# Patient Record
Sex: Female | Born: 1935 | Race: White | Hispanic: No | Smoking: Never smoker
Health system: Southern US, Community
[De-identification: ages and names within clinical notes are randomized; demographics above are authoritative.]

## PROBLEM LIST (undated history)

## (undated) DIAGNOSIS — I4891 Unspecified atrial fibrillation: Secondary | ICD-10-CM

## (undated) DIAGNOSIS — I10 Essential (primary) hypertension: Secondary | ICD-10-CM

## (undated) DIAGNOSIS — M545 Low back pain, unspecified: Secondary | ICD-10-CM

## (undated) DIAGNOSIS — N183 Chronic kidney disease, stage 3 unspecified: Secondary | ICD-10-CM

## (undated) DIAGNOSIS — I4892 Unspecified atrial flutter: Secondary | ICD-10-CM

## (undated) DIAGNOSIS — C50919 Malignant neoplasm of unspecified site of unspecified female breast: Secondary | ICD-10-CM

## (undated) DIAGNOSIS — I5030 Unspecified diastolic (congestive) heart failure: Secondary | ICD-10-CM

## (undated) DIAGNOSIS — E785 Hyperlipidemia, unspecified: Secondary | ICD-10-CM

## (undated) DIAGNOSIS — N959 Unspecified menopausal and perimenopausal disorder: Secondary | ICD-10-CM

## (undated) DIAGNOSIS — I38 Endocarditis, valve unspecified: Secondary | ICD-10-CM

## (undated) DIAGNOSIS — D510 Vitamin B12 deficiency anemia due to intrinsic factor deficiency: Secondary | ICD-10-CM

## (undated) DIAGNOSIS — E079 Disorder of thyroid, unspecified: Secondary | ICD-10-CM

## (undated) HISTORY — DX: Unspecified menopausal and perimenopausal disorder: N95.9

## (undated) HISTORY — PX: GALLBLADDER SURGERY: SHX652

## (undated) HISTORY — DX: Hyperlipidemia, unspecified: E78.5

## (undated) HISTORY — DX: Vitamin B12 deficiency anemia due to intrinsic factor deficiency: D51.0

## (undated) HISTORY — DX: Unspecified diastolic (congestive) heart failure: I50.30

## (undated) HISTORY — PX: THYROIDECTOMY: SHX17

## (undated) HISTORY — DX: Disorder of thyroid, unspecified: E07.9

## (undated) HISTORY — PX: MASTECTOMY: SHX3

## (undated) HISTORY — DX: Chronic kidney disease, stage 3 unspecified: N18.30

## (undated) HISTORY — PX: BREAST SURGERY: SHX581

## (undated) HISTORY — DX: Low back pain: M54.5

## (undated) HISTORY — DX: Unspecified atrial fibrillation: I48.91

## (undated) HISTORY — DX: Low back pain, unspecified: M54.50

## (undated) HISTORY — DX: Malignant neoplasm of unspecified site of unspecified female breast: C50.919

## (undated) HISTORY — DX: Endocarditis, valve unspecified: I38

## (undated) HISTORY — DX: Unspecified atrial flutter: I48.92

## (undated) HISTORY — DX: Chronic kidney disease, stage 3 (moderate): N18.3

## (undated) HISTORY — DX: Essential (primary) hypertension: I10

---

## 1998-02-28 ENCOUNTER — Other Ambulatory Visit: Admission: RE | Admit: 1998-02-28 | Discharge: 1998-02-28 | Payer: Self-pay | Admitting: Gastroenterology

## 2004-11-20 ENCOUNTER — Ambulatory Visit: Payer: Self-pay | Admitting: Unknown Physician Specialty

## 2005-04-07 ENCOUNTER — Other Ambulatory Visit: Payer: Self-pay

## 2005-04-07 ENCOUNTER — Inpatient Hospital Stay: Payer: Self-pay | Admitting: Internal Medicine

## 2005-05-01 ENCOUNTER — Inpatient Hospital Stay (HOSPITAL_COMMUNITY): Admission: EM | Admit: 2005-05-01 | Discharge: 2005-05-03 | Payer: Self-pay | Admitting: Emergency Medicine

## 2005-06-02 ENCOUNTER — Ambulatory Visit: Payer: Self-pay | Admitting: Internal Medicine

## 2005-08-13 ENCOUNTER — Ambulatory Visit: Payer: Self-pay | Admitting: Internal Medicine

## 2005-09-12 ENCOUNTER — Ambulatory Visit: Payer: Self-pay | Admitting: Internal Medicine

## 2005-10-14 ENCOUNTER — Ambulatory Visit: Payer: Self-pay | Admitting: Internal Medicine

## 2006-08-04 ENCOUNTER — Ambulatory Visit: Payer: Self-pay | Admitting: Ophthalmology

## 2006-08-11 ENCOUNTER — Ambulatory Visit: Payer: Self-pay | Admitting: Ophthalmology

## 2006-11-19 ENCOUNTER — Ambulatory Visit: Payer: Self-pay | Admitting: Internal Medicine

## 2007-02-22 ENCOUNTER — Ambulatory Visit: Payer: Self-pay | Admitting: Internal Medicine

## 2007-12-30 ENCOUNTER — Ambulatory Visit: Payer: Self-pay | Admitting: Internal Medicine

## 2008-03-31 ENCOUNTER — Ambulatory Visit: Payer: Self-pay | Admitting: Internal Medicine

## 2008-05-10 ENCOUNTER — Ambulatory Visit: Payer: Self-pay | Admitting: Unknown Physician Specialty

## 2008-11-21 ENCOUNTER — Emergency Department: Payer: Self-pay | Admitting: Emergency Medicine

## 2008-11-30 ENCOUNTER — Emergency Department: Payer: Self-pay | Admitting: Emergency Medicine

## 2009-01-22 ENCOUNTER — Ambulatory Visit: Payer: Self-pay | Admitting: Internal Medicine

## 2009-05-31 ENCOUNTER — Ambulatory Visit: Payer: Self-pay | Admitting: Internal Medicine

## 2009-09-10 ENCOUNTER — Ambulatory Visit: Payer: Self-pay

## 2009-10-14 ENCOUNTER — Emergency Department: Payer: Self-pay | Admitting: Emergency Medicine

## 2009-10-20 ENCOUNTER — Emergency Department: Payer: Self-pay | Admitting: Emergency Medicine

## 2009-10-23 ENCOUNTER — Inpatient Hospital Stay: Payer: Self-pay | Admitting: Unknown Physician Specialty

## 2009-11-24 ENCOUNTER — Emergency Department: Payer: Self-pay | Admitting: Emergency Medicine

## 2009-11-26 ENCOUNTER — Emergency Department: Payer: Self-pay | Admitting: Emergency Medicine

## 2009-12-05 ENCOUNTER — Observation Stay: Payer: Self-pay | Admitting: Internal Medicine

## 2010-03-19 ENCOUNTER — Ambulatory Visit: Payer: Self-pay | Admitting: Internal Medicine

## 2010-09-10 ENCOUNTER — Encounter: Payer: Self-pay | Admitting: Rheumatology

## 2010-11-18 ENCOUNTER — Ambulatory Visit: Payer: Self-pay | Admitting: Ophthalmology

## 2010-11-18 DIAGNOSIS — I119 Hypertensive heart disease without heart failure: Secondary | ICD-10-CM

## 2010-11-26 ENCOUNTER — Ambulatory Visit: Payer: Self-pay | Admitting: Ophthalmology

## 2011-03-11 ENCOUNTER — Inpatient Hospital Stay: Payer: Self-pay | Admitting: Internal Medicine

## 2011-03-15 ENCOUNTER — Emergency Department: Payer: Self-pay | Admitting: Emergency Medicine

## 2011-06-15 ENCOUNTER — Emergency Department: Payer: Self-pay | Admitting: Emergency Medicine

## 2011-08-02 ENCOUNTER — Observation Stay: Payer: Self-pay | Admitting: Internal Medicine

## 2011-10-21 ENCOUNTER — Other Ambulatory Visit: Payer: Self-pay | Admitting: Internal Medicine

## 2011-11-06 ENCOUNTER — Ambulatory Visit: Payer: Self-pay | Admitting: Internal Medicine

## 2011-11-06 LAB — PROTIME-INR: Prothrombin Time: 26 secs — ABNORMAL HIGH (ref 11.5–14.7)

## 2012-04-05 ENCOUNTER — Emergency Department: Payer: Self-pay | Admitting: Emergency Medicine

## 2012-04-05 LAB — URINALYSIS, COMPLETE
Bilirubin,UR: NEGATIVE
Blood: NEGATIVE
Glucose,UR: NEGATIVE mg/dL
Hyaline Cast: 20
Ketone: NEGATIVE
Nitrite: NEGATIVE
Ph: 5
Protein: NEGATIVE
RBC,UR: 5 /HPF
Specific Gravity: 1.009
Squamous Epithelial: 1
WBC UR: 5 /HPF

## 2012-04-07 LAB — URINE CULTURE

## 2012-10-25 ENCOUNTER — Emergency Department: Payer: Self-pay | Admitting: Emergency Medicine

## 2012-10-25 LAB — CBC
MCHC: 33.2 g/dL (ref 32.0–36.0)
MCV: 92 fL (ref 80–100)
RBC: 4.72 10*6/uL (ref 3.80–5.20)

## 2012-10-25 LAB — BASIC METABOLIC PANEL
Anion Gap: 11 (ref 7–16)
Calcium, Total: 9.1 mg/dL (ref 8.5–10.1)
Chloride: 102 mmol/L (ref 98–107)
Creatinine: 0.83 mg/dL (ref 0.60–1.30)
EGFR (Non-African Amer.): >60

## 2012-10-25 LAB — TROPONIN I: Troponin-I: <0.02 ng/mL

## 2012-10-25 LAB — CK TOTAL AND CKMB (NOT AT ARMC): CK, Total: 58 U/L (ref 21–215)

## 2012-10-26 LAB — URINALYSIS, COMPLETE
Blood: NEGATIVE
Glucose,UR: NEGATIVE mg/dL (ref 0–75)
Hyaline Cast: 25
RBC,UR: 4 /HPF (ref 0–5)
Squamous Epithelial: 1
WBC UR: 9 /HPF (ref 0–5)

## 2012-10-26 LAB — TSH: Thyroid Stimulating Horm: 0.539 u[IU]/mL

## 2012-10-26 LAB — PROTIME-INR: INR: 2

## 2012-10-27 LAB — URINE CULTURE

## 2013-02-27 ENCOUNTER — Emergency Department: Payer: Self-pay | Admitting: Emergency Medicine

## 2013-02-27 LAB — URINALYSIS, COMPLETE
Bacteria: NONE SEEN
Bilirubin,UR: NEGATIVE
Leukocyte Esterase: NEGATIVE
Nitrite: NEGATIVE
Specific Gravity: 1.005 (ref 1.003–1.030)

## 2013-02-27 LAB — BASIC METABOLIC PANEL
Anion Gap: 6 — ABNORMAL LOW (ref 7–16)
BUN: 13 mg/dL (ref 7–18)
Calcium, Total: 8.8 mg/dL (ref 8.5–10.1)
Creatinine: 0.82 mg/dL (ref 0.60–1.30)
Glucose: 107 mg/dL — ABNORMAL HIGH (ref 65–99)
Osmolality: 276 (ref 275–301)
Potassium: 3.8 mmol/L (ref 3.5–5.1)

## 2013-02-27 LAB — CBC
HCT: 41.7 % (ref 35.0–47.0)
HGB: 14.2 g/dL (ref 12.0–16.0)
MCH: 31.2 pg (ref 26.0–34.0)
MCHC: 34 g/dL (ref 32.0–36.0)
WBC: 8.6 10*3/uL (ref 3.6–11.0)

## 2013-02-27 LAB — PROTIME-INR: Prothrombin Time: 25.1 secs — ABNORMAL HIGH (ref 11.5–14.7)

## 2013-02-27 LAB — TSH: Thyroid Stimulating Horm: 0.468 u[IU]/mL

## 2013-02-27 LAB — TROPONIN I: Troponin-I: <0.02 ng/mL

## 2013-03-12 ENCOUNTER — Emergency Department: Payer: Self-pay | Admitting: Emergency Medicine

## 2014-02-20 ENCOUNTER — Ambulatory Visit: Payer: Self-pay | Admitting: Family Medicine

## 2014-07-04 ENCOUNTER — Encounter: Payer: Self-pay | Admitting: Internal Medicine

## 2014-07-04 ENCOUNTER — Ambulatory Visit (INDEPENDENT_AMBULATORY_CARE_PROVIDER_SITE_OTHER): Payer: PRIVATE HEALTH INSURANCE | Admitting: Internal Medicine

## 2014-07-04 VITALS — BP 148/98 | HR 95 | Ht 63.0 in | Wt 186.2 lb

## 2014-07-04 DIAGNOSIS — I4891 Unspecified atrial fibrillation: Secondary | ICD-10-CM

## 2014-07-04 DIAGNOSIS — R079 Chest pain, unspecified: Secondary | ICD-10-CM

## 2014-07-04 NOTE — Progress Notes (Signed)
ELECTROPHYSIOLOGY CONSULT NOTE  Patient ID: Tammie Mcmahon, MRN: [id number], DOB/AGE: 12-06-1927 80 y.o. Admit date: (Not on file) Date of Consult: 07/04/2014  Primary Physician: Kirk Ruths., MD Primary Cardiologist: new  Chief Complaint: Atrial fibrillation   HPI Tammie Mcmahon is a 80 y.o. female  Referred for consideration of treatment options given persistent/permanent atrial fibrillation/flutter an ongoing problem with dyspnea on exertion.  This is been noted at least since 2012 with heart rates in the 90s/120s. She's been treated with metoprolol-->> 200 mg and diltiazem-->> 360 mg daily with ongoing heart rates overall 100. This has been further complicated by dyspnea on exertion and some peripheral edema. She denies lightheadedness or syncope.  Echocardiogram 2012 was normal LV function with moderate left atrial enlargement; Myoview 2012 without ischemia  Thromboembolic risk profile age-55, hypertension-1, diabetes-1, gender-1, for a CHADS-VASc score of 5    Past Medical History  Diagnosis Date  . A-fib   . Arrhythmia   . Breast cancer   . Thyroid disease   . Hypertension   . Hyperlipidemia   . Diabetes mellitus without complication   . CKD (chronic kidney disease), stage III   . Valvular heart disease   . Low back pain   . PA (pernicious anemia)   . Menopausal and postmenopausal disorder       Surgical History:  Past Surgical History  Procedure Laterality Date  . Thyroidectomy    . Breast surgery    . Gallbladder surgery       Home Meds: Prior to Admission medications   Medication Sig Start Date End Date Taking? Authorizing Provider  cyanocobalamin (,VITAMIN B-12,) 1000 MCG/ML injection Inject into the muscle every 30 (thirty) days.  03/07/14 03/02/15 Yes Historical Provider, MD  diltiazem (CARTIA XT) 180 MG 24 hr capsule Take 180 mg by mouth daily.  01/20/14  Yes Historical Provider, MD  DULoxetine (CYMBALTA) 60 MG capsule take 1  capsule by mouth once daily 06/26/14  Yes Historical Provider, MD  fluticasone (FLONASE) 50 MCG/ACT nasal spray 1 spray as needed.  01/01/14  Yes Historical Provider, MD  furosemide (LASIX) 20 MG tablet Take 20 mg by mouth daily.   Yes Historical Provider, MD  levothyroxine (SYNTHROID, LEVOTHROID) 125 MCG tablet Take 125 mcg by mouth daily before breakfast.   Yes Historical Provider, MD  metoprolol (TOPROL-XL) 200 MG 24 hr tablet Take 200 mg by mouth daily.  11/07/13  Yes Historical Provider, MD  potassium chloride (MICRO-K) 10 MEQ CR capsule Take 10 mEq by mouth 2 (two) times daily. take 1 capsule by mouth twice a day 04/10/14  Yes Historical Provider, MD  pravastatin (PRAVACHOL) 40 MG tablet take 1 tablet by mouth EVERY NIGHT 05/19/14  Yes Historical Provider, MD  warfarin (COUMADIN) 2 MG tablet Take by mouth as directed.  12/26/13  Yes Historical Provider, MD     Allergies: No Known Allergies  History   Social History  . Marital Status: Divorced    Spouse Name: N/A    Number of Children: N/A  . Years of Education: N/A   Occupational History  . Not on file.   Social History Main Topics  . Smoking status: Never Smoker   . Smokeless tobacco: Not on file  . Alcohol Use: No  . Drug Use: No  . Sexual Activity: Not on file   Other Topics Concern  . Not on file   Social History Narrative  . No narrative on file     Family History  Problem Relation Age of Onset  . Heart attack Father      ROS:  Please see the history of present illness.     All other systems reviewed and negative.    Physical Exam: Height 5\' 3"  (1.6 m), weight 186 lb 4 oz (84.482 kg). General: Well developed, well nourished female in no acute distress. Head: Normocephalic, atraumatic, sclera non-icteric, no xanthomas, nares are without discharge. EENT: normal Lymph Nodes:  none Back: without scoliosis/kyphosis , no CVA tendersness Neck: Negative for carotid bruits. JVD not elevated. Lungs: Clear bilaterally to  auscultation without wheezes, rales, or rhonchi. Breathing is unlabored. Heart: rapid and regular RR with S1 S2. 2 /6 systolic murmur , rubs, or gallops appreciated. Abdomen: Soft, non-tender, non-distended with normoactive bowel sounds. No hepatomegaly. No rebound/guarding. No obvious abdominal masses. Msk:  Strength and tone appear normal for age. Extremities: No clubbing or cyanosis. No edema.  Distal pedal pulses are 2+ and equal bilaterally. Skin: Warm and Dry Neuro: Alert and oriented X 3. CN III-XII intact Grossly normal sensory and motor function . Psych:  Responds to questions appropriately with a normal affect.      Labs: Cardiac Enzymes No results found for this basename: CKTOTAL, CKMB, TROPONINI,  in the last 72 hours CBC No results found for this basename: WBC, HGB, HCT, MCV, PLT   PROTIME: No results found for this basename: LABPROT, INR,  in the last 72 hours Chemistry No results found for this basename: NA, K, CL, CO2, BUN, CREATININE, CALCIUM, LABALBU, PROT, BILITOT, ALKPHOS, ALT, AST, GLUCOSE,  in the last 168 hours Lipids No results found for this basename: CHOL, HDL, LDLCALC, TRIG   BNP No results found for this basename: probnp   Miscellaneous No results found for this basename: DDIMER    Radiology/Studies:  No results found.  EKG:  Atrial flutter with an atrial cycle length of 300 ms with 2 to one conduction. 7/14 atrial flutter with variable conduction  atrial cycle length 260 ms 1/13 atrial flutter with variable conduction  atrial cycle length 2 60 ms 6/12 atrial flutter with variable conduction  atrial cycle length 280 ms  Assessment and Plan Atrial flutter/fibrillation  Chest pain syndrome  HFpEF  Thromboembolic risk factors-hypertension, diabetes, age, gender her CHADS-VASc score of 5  Breast cancer history status post mastectomy  The patient has rapid atrial fibrillation/flutter with variable but seemingly progressive atrial cycle length  lengthening suggestive of a progressive atrial disorder. She has been noted to have heart rates recently in the 120s. We need to clarify severity of heart rate range to identify treatment options as she is maximally medicated with calcium blockers and beta blockers. With normal LV function I am loath to use digoxin  We will get an echocardiogram to look for interval only function deterioration  In the event that we see no and Holter monitoring demonstrates excessive rates, AV junction ablation with pacing may be our next best option. I have reviewed this with her.  I am also concerned about her chest pain syndrome. She does have a history of significant gas and there was some gas associated with this episode perhaps (history changed over time) suggesting that it may be a colonic flexure syndrome picture; however, given its recurrent nature, we'll check a troponin to make sure. The troponin is abnormal she will need further coronary evaluation; her last stress test was 2012 demonstrated normal perfusion  Virl Axe

## 2014-07-04 NOTE — Patient Instructions (Signed)
Your physician recommends that you have labs today:  Troponin   Your physician has requested that you have an echocardiogram. Echocardiography is a painless test that uses sound waves to create images of your heart. It provides your doctor with information about the size and shape of your heart and how well your heart's chambers and valves are working. This procedure takes approximately one hour. There are no restrictions for this procedure.   Your physician has recommended that you wear a 24 hr holter monitor. Holter monitors are medical devices that record the heart's electrical activity. Doctors most often use these monitors to diagnose arrhythmias. Arrhythmias are problems with the speed or rhythm of the heartbeat. The monitor is a small, portable device. You can wear one while you do your normal daily activities. This is usually used to diagnose what is causing palpitations/syncope (passing out).  Your physician recommends that you schedule a follow-up appointment in:  3-4 weeks

## 2014-07-05 LAB — TROPONIN I: Troponin I: 0.02 ng/mL (ref 0.00–0.04)

## 2014-07-10 DIAGNOSIS — I4891 Unspecified atrial fibrillation: Secondary | ICD-10-CM

## 2014-07-13 ENCOUNTER — Other Ambulatory Visit: Payer: PRIVATE HEALTH INSURANCE

## 2014-07-18 ENCOUNTER — Other Ambulatory Visit (INDEPENDENT_AMBULATORY_CARE_PROVIDER_SITE_OTHER): Payer: PRIVATE HEALTH INSURANCE

## 2014-07-18 ENCOUNTER — Other Ambulatory Visit: Payer: Self-pay

## 2014-07-18 DIAGNOSIS — R579 Shock, unspecified: Secondary | ICD-10-CM

## 2014-07-18 DIAGNOSIS — R079 Chest pain, unspecified: Secondary | ICD-10-CM

## 2014-07-18 DIAGNOSIS — I481 Persistent atrial fibrillation: Secondary | ICD-10-CM

## 2014-07-20 ENCOUNTER — Telehealth: Payer: Self-pay | Admitting: *Deleted

## 2014-07-20 NOTE — Telephone Encounter (Signed)
Informed patient per Dr. Caryl Comes holter showed  Controlled ventricular response  Tammie Mcmahon afib   Patient verbalized understanding

## 2014-07-24 ENCOUNTER — Ambulatory Visit (INDEPENDENT_AMBULATORY_CARE_PROVIDER_SITE_OTHER): Payer: PRIVATE HEALTH INSURANCE

## 2014-07-24 ENCOUNTER — Other Ambulatory Visit: Payer: Self-pay

## 2014-07-24 DIAGNOSIS — I4891 Unspecified atrial fibrillation: Secondary | ICD-10-CM

## 2014-07-25 ENCOUNTER — Encounter: Payer: Self-pay | Admitting: Internal Medicine

## 2014-07-25 ENCOUNTER — Ambulatory Visit (INDEPENDENT_AMBULATORY_CARE_PROVIDER_SITE_OTHER): Payer: PRIVATE HEALTH INSURANCE | Admitting: Internal Medicine

## 2014-07-25 VITALS — BP 138/92 | HR 91 | Ht 63.0 in | Wt 186.5 lb

## 2014-07-25 DIAGNOSIS — R079 Chest pain, unspecified: Secondary | ICD-10-CM

## 2014-07-25 NOTE — Progress Notes (Signed)
Patient Care Team: Kirk Ruths, MD as PCP - General (Internal Medicine)   HPI  Tammie Mcmahon is a 80 y.o. female Seen in followup for atrial flutter with progressive atrial cycling slowing suggestive of worsening atrial cardiomyopathy.   At that time we increased her diltiazem from 1--2 a day since that time, she is feeling much better with less dyspnea and no edema  She underwent echocardiogram 10/15 demonstrated an ejection fraction fraction of 55-60%. Left atrial size is only mildly enlarged.  Holter monitoring demonstrated controlled ventricular response and largely underlying atrial fibrillation as opposed to flutter. Mean heart rate was 79 with a range of 52--97     Past Medical History  Diagnosis Date  . A-fib   . Arrhythmia   . Breast cancer   . Thyroid disease   . Hypertension   . Hyperlipidemia   . Diabetes mellitus without complication   . CKD (chronic kidney disease), stage III   . Valvular heart disease   . Low back pain   . PA (pernicious anemia)   . Menopausal and postmenopausal disorder     Past Surgical History  Procedure Laterality Date  . Thyroidectomy    . Breast surgery    . Gallbladder surgery      Current Outpatient Prescriptions  Medication Sig Dispense Refill  . cyanocobalamin (,VITAMIN B-12,) 1000 MCG/ML injection Inject into the muscle every 30 (thirty) days.       Marland Kitchen diltiazem (CARTIA XT) 180 MG 24 hr capsule Take 180 mg by mouth 2 (two) times daily.       . DULoxetine (CYMBALTA) 60 MG capsule take 1 capsule by mouth once daily      . fluticasone (FLONASE) 50 MCG/ACT nasal spray 1 spray as needed.       . furosemide (LASIX) 20 MG tablet Take 20 mg by mouth daily.      Marland Kitchen levothyroxine (SYNTHROID, LEVOTHROID) 125 MCG tablet Take 125 mcg by mouth daily before breakfast.      . metoprolol (TOPROL-XL) 200 MG 24 hr tablet Take 200 mg by mouth daily.       . potassium chloride (MICRO-K) 10 MEQ CR capsule Take 10 mEq by  mouth 2 (two) times daily. take 1 capsule by mouth twice a day      . pravastatin (PRAVACHOL) 40 MG tablet take 1 tablet by mouth EVERY NIGHT      . warfarin (COUMADIN) 2 MG tablet Take by mouth as directed.        No current facility-administered medications for this visit.    No Known Allergies  Review of Systems negative except from HPI and PMH  Physical Exam BP 138/92  Pulse 91  Ht 5\' 3"  (1.6 m)  Wt 186 lb 8 oz (84.596 kg)  BMI 33.05 kg/m2 Well developed and well nourished in no acute distress HENT normal E scleral and icterus clear Neck Supple JVP flat; carotids brisk and full Clear to ausculation  irregularly irRegular rate and rhythm, no murmurs gallops or rub Soft with active bowel sounds No clubbing cyanosis  Edema Alert and oriented, grossly normal motor and sensory function Skin Warm and Dry  ECG demonstrates Atrial flutter with 2-1 conduction with an atrial cycle length of 320 ms   Assessment and  Plan Atrial fibrillation/flutter   HFpEF  Overall she is much improved with a slower ventricular rate. We discussed treatment options including antiarrhythmic therapy with cardioversion the likelihood of which is enhanced  by the fact that her left atrial dimension was pretty small and AV junction ablation and pacing. She however says she is feeling quite good at this point with minimal functional impairment and would like to do nothing else.   We will see her as needed per her request or Dr. Tonette Bihari

## 2014-07-25 NOTE — Patient Instructions (Signed)
Your physician recommends that you schedule a follow-up appointment in:  As needed   Your physician recommends that you continue on your current medications as directed. Please refer to the Current Medication list given to you today.  Your next appointment will be scheduled in our new office located at :  [place] [facility]  [address], [place]  [place], [place] [address]

## 2014-12-06 ENCOUNTER — Ambulatory Visit: Payer: Self-pay | Admitting: Internal Medicine

## 2015-05-10 ENCOUNTER — Ambulatory Visit: Payer: Medicare Other | Admitting: Physical Therapy

## 2015-05-14 ENCOUNTER — Ambulatory Visit: Payer: Medicare Other | Admitting: Physical Therapy

## 2015-05-16 ENCOUNTER — Encounter: Payer: Medicaid Other | Admitting: Physical Therapy

## 2015-05-21 ENCOUNTER — Ambulatory Visit: Payer: Medicare Other | Attending: Nurse Practitioner | Admitting: Physical Therapy

## 2015-05-21 DIAGNOSIS — R2681 Unsteadiness on feet: Secondary | ICD-10-CM | POA: Insufficient documentation

## 2015-05-21 DIAGNOSIS — W19XXXA Unspecified fall, initial encounter: Secondary | ICD-10-CM | POA: Insufficient documentation

## 2015-05-21 NOTE — Therapy (Signed)
[facility] [address] [place], [address] Phone: [phone]   Fax:  [phone]  Physical Therapy Evaluation  Patient Details  Name: Tammie Mcmahon MRN: [id number] Date of Birth: [birth year] Referring Provider:  Barbette Merino, NP  Encounter Date: 05/21/2015      PT End of Session - 05/21/15 1529    Visit Number 1   Number of Visits 9   Date for PT Re-Evaluation 06/18/15   Authorization Type 1   Authorization Time Period 10   PT Start Time 1430   PT Stop Time 1515   PT Time Calculation (min) 45 min   Activity Tolerance Patient tolerated treatment well;No increased pain   Behavior During Therapy [facility] for tasks assessed/performed      Past Medical History  Diagnosis Date  . Atrial fibrillation and flutter   . (HFpEF) heart failure with preserved ejection fraction   . Breast cancer   . Thyroid disease   . Hypertension   . Hyperlipidemia   . Diabetes mellitus without complication   . CKD (chronic kidney disease), stage III   . Valvular heart disease   . Low back pain   . PA (pernicious anemia)   . Menopausal and postmenopausal disorder     Past Surgical History  Procedure Laterality Date  . Thyroidectomy    . Breast surgery    . Gallbladder surgery      There were no vitals filed for this visit.  Visit Diagnosis:  Unsteady gait - Plan: PT plan of care cert/re-cert  Falls, initial encounter - Plan: PT plan of care cert/re-cert      Subjective Assessment - 05/21/15 1509    Subjective Patient repots that she is not walking as much as she used to and has fallen and does not feel very steady.    Limitations Walking   How long can you walk comfortably? <10 min   Patient Stated Goals to walk like she used to, improve balance   Currently in Pain? Yes   Pain Location Back   Pain Orientation Posterior;Lower   Pain Descriptors / Indicators Nagging;Aching   Pain Type Chronic pain    Pain Onset More than a month ago   Pain Frequency Constant   Multiple Pain Sites No            [facility] PT Assessment - 05/21/15 0001    Assessment   Medical Diagnosis Unsteady gait, left side weakness   Next MD Visit 07/13/15   Precautions   Precautions None   Balance Screen   Has the patient fallen in the past 6 months Yes   How many times? 1   Has the patient had a decrease in activity level because of a fear of falling?  Yes   Is the patient reluctant to leave their home because of a fear of falling?  Yes   [place] Private residence   Living Arrangements Alone   Type of [place] Access Level entry   [place] - 2 wheels   Prior Function   Level of [place] Retired   Associate Professor   Overall Cognitive Status Within Functional Limits for tasks assessed   Observation/Other Assessments   Lower Extremity Functional Scale  50/80   Observation/Other Assessments-Edema    Edema --  Bilateral ankle swelling noted L > R   ROM / Strength   AROM /  PROM / Strength AROM;PROM;Strength   AROM   Overall AROM  Within functional limits for tasks performed   Strength   Overall Strength Within functional limits for tasks performed   Balance   Balance Assessed Yes   Standardized Balance Assessment   Standardized Balance Assessment Berg Balance Test   Five times sit to stand comments  16 sec   Berg Balance Test   Sit to Stand Able to stand without using hands and stabilize independently   Standing Unsupported Able to stand safely 2 minutes   Sitting with Back Unsupported but Feet Supported on Floor or Stool Able to sit safely and securely 2 minutes   Stand to Sit Controls descent by using hands   Transfers Able to transfer safely, minor use of hands   Standing Unsupported with Eyes Closed Able to stand 10 seconds safely   Standing Ubsupported with Feet Together Able to place feet together independently and stand  for 1 minute with supervision   From Standing, Reach Forward with Outstretched Arm Can reach forward >5 cm safely (2")   From Standing Position, Pick up Object from Floor Able to pick up shoe, needs supervision   From Standing Position, Turn to Look Behind Over each Shoulder Looks behind from both sides and weight shifts well   Turn 360 Degrees Able to turn 360 degrees safely in 4 seconds or less   Standing Unsupported, Alternately Place Feet on Step/Stool Able to complete >2 steps/needs minimal assist   Standing Unsupported, One Foot in Front Needs help to step but can hold 15 seconds   Standing on One Leg Able to lift leg independently and hold 5-10 seconds   Total Score 44   Timed Up and Go Test   TUG Normal TUG   Normal TUG (seconds) 16      Treatment to include:  Standing activities, narrow stance, standing marching x 20, heel raises x20, sidestepping on blue foam beam x 6 reps 20'.              PT Education - 05/21/15 1529    Education provided Yes   Education Details HEP, balance exercises.   Person(s) Educated Patient   Methods Explanation   Comprehension Verbalized understanding             PT Long Term Goals - 05/21/15 1532    PT LONG TERM GOAL #1   Title Patient will be independent with HEP for improved strength and functional mobility.    Time 4   Period Weeks   Status New   PT LONG TERM GOAL #2   Title Patient will demonstrate improved balance by improving BERG balance score to 50/56   Baseline 44/56   Time 4   Status New   PT LONG TERM GOAL #3   Title Patient will demonstrate improved functional mobility by improved LEFS to 63/80   Baseline 50/80   Time 4   Period Weeks   Status New               Plan - 05/21/15 1530    Clinical Impression Statement Patient is an 81 year old female who reports fall, unable to walk like she used to and unsteadiness. Pt also reports ankle swelling.    Pt will benefit from skilled therapeutic  intervention in order to improve on the following deficits Abnormal gait;Decreased balance;Difficulty walking   Rehab Potential Good   PT Frequency 2x / week   PT Duration 4 weeks  PT Treatment/Interventions Balance training;Patient/family education;Therapeutic exercise   Consulted and Agree with Plan of Care Patient         Problem List There are no active problems to display for this patient.   Cobe Viney, PT, MPT 05/21/2015, 3:36 PM  [facility] [address], [place], [address] Phone: [phone]   Fax:  [phone]

## 2015-05-28 ENCOUNTER — Ambulatory Visit: Payer: Medicare Other | Admitting: Physical Therapy

## 2015-05-31 ENCOUNTER — Ambulatory Visit: Payer: Medicare Other | Admitting: Physical Therapy

## 2015-06-04 ENCOUNTER — Ambulatory Visit: Payer: Medicare Other | Admitting: Physical Therapy

## 2015-06-14 ENCOUNTER — Ambulatory Visit: Payer: Medicare Other | Attending: Nurse Practitioner | Admitting: Physical Therapy

## 2015-06-15 ENCOUNTER — Ambulatory Visit: Payer: Medicare Other | Admitting: Physical Therapy

## 2015-06-18 ENCOUNTER — Encounter: Payer: Medicare Other | Admitting: Physical Therapy

## 2015-06-21 ENCOUNTER — Encounter: Payer: Medicare Other | Admitting: Physical Therapy

## 2015-07-25 ENCOUNTER — Other Ambulatory Visit
Admission: RE | Admit: 2015-07-25 | Discharge: 2015-07-25 | Disposition: A | Discharge status: Home / Self Care | Payer: Medicare Other | Source: Ambulatory Visit | Attending: Internal Medicine | Admitting: Internal Medicine

## 2015-07-25 DIAGNOSIS — I4892 Unspecified atrial flutter: Secondary | ICD-10-CM | POA: Insufficient documentation

## 2015-07-25 LAB — TROPONIN I

## 2015-12-16 ENCOUNTER — Emergency Department: Payer: Medicare Other

## 2015-12-16 ENCOUNTER — Emergency Department
Admission: EM | Admit: 2015-12-16 | Discharge: 2015-12-16 | Disposition: A | Discharge status: Home / Self Care | Payer: Medicare Other | Attending: Emergency Medicine | Admitting: Emergency Medicine

## 2015-12-16 ENCOUNTER — Encounter: Payer: Self-pay | Admitting: Emergency Medicine

## 2015-12-16 DIAGNOSIS — Z79899 Other long term (current) drug therapy: Secondary | ICD-10-CM | POA: Insufficient documentation

## 2015-12-16 DIAGNOSIS — E119 Type 2 diabetes mellitus without complications: Secondary | ICD-10-CM | POA: Diagnosis not present

## 2015-12-16 DIAGNOSIS — N183 Chronic kidney disease, stage 3 (moderate): Secondary | ICD-10-CM | POA: Diagnosis not present

## 2015-12-16 DIAGNOSIS — J209 Acute bronchitis, unspecified: Secondary | ICD-10-CM | POA: Diagnosis not present

## 2015-12-16 DIAGNOSIS — I499 Cardiac arrhythmia, unspecified: Secondary | ICD-10-CM | POA: Diagnosis not present

## 2015-12-16 DIAGNOSIS — I129 Hypertensive chronic kidney disease with stage 1 through stage 4 chronic kidney disease, or unspecified chronic kidney disease: Secondary | ICD-10-CM | POA: Diagnosis not present

## 2015-12-16 DIAGNOSIS — R0602 Shortness of breath: Secondary | ICD-10-CM | POA: Diagnosis present

## 2015-12-16 DIAGNOSIS — Z7901 Long term (current) use of anticoagulants: Secondary | ICD-10-CM | POA: Diagnosis not present

## 2015-12-16 LAB — URINALYSIS COMPLETE WITH MICROSCOPIC (ARMC ONLY)
BACTERIA UA: NONE SEEN
Bilirubin Urine: NEGATIVE
Glucose, UA: NEGATIVE mg/dL
KETONES UR: NEGATIVE mg/dL
LEUKOCYTES UA: NEGATIVE
NITRITE: NEGATIVE
PROTEIN: NEGATIVE mg/dL
Specific Gravity, Urine: 1.009 (ref 1.005–1.030)
pH: 6 (ref 5.0–8.0)

## 2015-12-16 LAB — RAPID INFLUENZA A&B ANTIGENS (ARMC ONLY): INFLUENZA A (ARMC): NEGATIVE

## 2015-12-16 LAB — PROTIME-INR
INR: 1.81
PROTHROMBIN TIME: 20.9 s — AB (ref 11.4–15.0)

## 2015-12-16 LAB — CBC
HEMATOCRIT: 39.1 % (ref 35.0–47.0)
Hemoglobin: 13.1 g/dL (ref 12.0–16.0)
MCH: 30 pg (ref 26.0–34.0)
MCHC: 33.6 g/dL (ref 32.0–36.0)
MCV: 89.3 fL (ref 80.0–100.0)
PLATELETS: 254 10*3/uL (ref 150–440)
RBC: 4.38 MIL/uL (ref 3.80–5.20)
RDW: 14.8 % — AB (ref 11.5–14.5)
WBC: 6.3 10*3/uL (ref 3.6–11.0)

## 2015-12-16 LAB — BASIC METABOLIC PANEL
Anion gap: 8 (ref 5–15)
BUN: 12 mg/dL (ref 6–20)
CHLORIDE: 101 mmol/L (ref 101–111)
CO2: 27 mmol/L (ref 22–32)
Calcium: 8.4 mg/dL — ABNORMAL LOW (ref 8.9–10.3)
Creatinine, Ser: 0.93 mg/dL (ref 0.44–1.00)
GFR, EST NON AFRICAN AMERICAN: 54 mL/min — AB (ref >60)
Glucose, Bld: 113 mg/dL — ABNORMAL HIGH (ref 65–99)
POTASSIUM: 3.6 mmol/L (ref 3.5–5.1)
SODIUM: 136 mmol/L (ref 135–145)

## 2015-12-16 LAB — TROPONIN I: TROPONIN I: 0.04 ng/mL — AB (ref <0.031)

## 2015-12-16 LAB — RAPID INFLUENZA A&B ANTIGENS: Influenza B (ARMC): NEGATIVE

## 2015-12-16 MED ORDER — AZITHROMYCIN 250 MG PO TABS: ORAL_TABLET | ORAL | Status: DC

## 2015-12-16 MED ORDER — ACETAMINOPHEN 500 MG PO TABS
1000.0000 mg | ORAL_TABLET | ORAL | Status: AC
  Administered 2015-12-16: 1000 mg via ORAL
  Filled 2015-12-16: qty 2

## 2015-12-16 MED ORDER — AZITHROMYCIN 500 MG PO TABS
500.0000 mg | ORAL_TABLET | Freq: Once | ORAL | Status: AC
  Administered 2015-12-16: 500 mg via ORAL
  Filled 2015-12-16: qty 1

## 2015-12-16 NOTE — ED Notes (Signed)
Patient presents to the ED with reports of feeling weak and short of breath for the past 2 days, left sided rib pain, cough and congestion.  Patient states her left side is very tender to touch.  Patient is alert and oriented x 4.  Patient is in no obvious distress at this time.

## 2015-12-16 NOTE — ED Notes (Signed)
Troponin 0.04. MD notified.  

## 2015-12-16 NOTE — Discharge Instructions (Signed)
We believe that your symptoms are caused today by bronchitis, an infection in your lung(s).    Please take the full course of antibiotics as prescribed and drink plenty of fluids.    Follow up with your doctor within 1-2 days.  If you develop any new or worsening symptoms,  persistent vomiting, worsening shortness of breath, or other symptoms that concern you, please return to the Emergency Department immediately.    Acute Bronchitis Bronchitis is inflammation of the airways that extend from the windpipe into the lungs (bronchi). The inflammation often causes mucus to develop. This leads to a cough, which is the most common symptom of bronchitis.  In acute bronchitis, the condition usually develops suddenly and goes away over time, usually in a couple weeks. Smoking, allergies, and asthma can make bronchitis worse. Repeated episodes of bronchitis may cause further lung problems.  CAUSES Acute bronchitis is most often caused by the same virus that causes a cold. The virus can spread from person to person (contagious) through coughing, sneezing, and touching contaminated objects. SIGNS AND SYMPTOMS   Cough.   Fever.   Coughing up mucus.   Body aches.   Chest congestion.   Chills.   Shortness of breath.   Sore throat.  DIAGNOSIS  Acute bronchitis is usually diagnosed through a physical exam. Your health care provider will also ask you questions about your medical history. Tests, such as chest X-rays, are sometimes done to rule out other conditions.  TREATMENT  Acute bronchitis usually goes away in a couple weeks. Oftentimes, no medical treatment is necessary. Medicines are sometimes given for relief of fever or cough. Antibiotic medicines are usually not needed but may be prescribed in certain situations. In some cases, an inhaler may be recommended to help reduce shortness of breath and control the cough. A cool mist vaporizer may also be used to help thin bronchial secretions and  make it easier to clear the chest.  HOME CARE INSTRUCTIONS  Get plenty of rest.   Drink enough fluids to keep your urine clear or pale yellow (unless you have a medical condition that requires fluid restriction). Increasing fluids may help thin your respiratory secretions (sputum) and reduce chest congestion, and it will prevent dehydration.   Take medicines only as directed by your health care provider.  If you were prescribed an antibiotic medicine, finish it all even if you start to feel better.  Avoid smoking and secondhand smoke. Exposure to cigarette smoke or irritating chemicals will make bronchitis worse. If you are a smoker, consider using nicotine gum or skin patches to help control withdrawal symptoms. Quitting smoking will help your lungs heal faster.   Reduce the chances of another bout of acute bronchitis by washing your hands frequently, avoiding people with cold symptoms, and trying not to touch your hands to your mouth, nose, or eyes.   Keep all follow-up visits as directed by your health care provider.  SEEK MEDICAL CARE IF: Your symptoms do not improve after 1 week of treatment.  SEEK IMMEDIATE MEDICAL CARE IF:  You develop an increased fever or chills.   You have chest pain.   You have severe shortness of breath.  You have bloody sputum.   You develop dehydration.  You faint or repeatedly feel like you are going to pass out.  You develop repeated vomiting.  You develop a severe headache. MAKE SURE YOU:   Understand these instructions.  Will watch your condition.  Will get help right away if you  are not doing well or get worse.   This information is not intended to replace advice given to you by your health care provider. Make sure you discuss any questions you have with your health care provider.   Document Released: 10/30/2004 Document Revised: 10/13/2014 Document Reviewed: 03/15/2013 Elsevier Interactive Patient Education [employer].

## 2015-12-16 NOTE — ED Provider Notes (Addendum)
[facility] Emergency Department Provider Note  ____________________________________________  Time seen: Approximately 4:11 PM  I have reviewed the triage vital signs and the nursing notes.   HISTORY  Chief Complaint Weakness and Shortness of Breath    HPI Tammie Mcmahon is a 82 y.o. female history ofatrial fibrillation, diabetes, chronic kidney disease.  The patient reports that for the last 4-5 days she's had a slightly productive cough, been having achiness in the left lower chest for about the last 2 days and a mild feeling of shortness of breath. She feels like she is having some discomfort along the ribs on the lower side seems to be worse with coughing. Denies muscle aches, but has felt feverish. She did have an influenza vaccine this year.  She denies having "chest pain" except for achiness along the left ribs. No radiating pain to the neck back. No nausea or vomiting.   Past Medical History  Diagnosis Date  . Atrial fibrillation and flutter ([place])   . (HFpEF) heart failure with preserved ejection fraction ([place])   . Breast cancer ([place])   . Thyroid disease   . Hypertension   . Hyperlipidemia   . Diabetes mellitus without complication ([place])   . CKD (chronic kidney disease), stage III   . Valvular heart disease   . Low back pain   . PA (pernicious anemia)   . Menopausal and postmenopausal disorder     There are no active problems to display for this patient.   Past Surgical History  Procedure Laterality Date  . Thyroidectomy    . Breast surgery    . Gallbladder surgery      Current Outpatient Rx  Name  Route  Sig  Dispense  Refill  . azithromycin (ZITHROMAX) 250 MG tablet      Take 1 tablet by mouth dailys starting 12/17/2015   4 tablet   0   . diltiazem (CARTIA XT) 180 MG 24 hr capsule   Oral   Take 180 mg by mouth 2 (two) times daily.          . DULoxetine (CYMBALTA) 60 MG capsule      take 1 capsule by mouth once  daily         . fluticasone (FLONASE) 50 MCG/ACT nasal spray      1 spray as needed.          . furosemide (LASIX) 20 MG tablet   Oral   Take 20 mg by mouth daily.         Marland Kitchen levothyroxine (SYNTHROID, LEVOTHROID) 125 MCG tablet   Oral   Take 125 mcg by mouth daily before breakfast.         . metoprolol (TOPROL-XL) 200 MG 24 hr tablet   Oral   Take 200 mg by mouth daily.          . potassium chloride (MICRO-K) 10 MEQ CR capsule   Oral   Take 10 mEq by mouth 2 (two) times daily. take 1 capsule by mouth twice a day         . pravastatin (PRAVACHOL) 40 MG tablet      take 1 tablet by mouth EVERY NIGHT         . warfarin (COUMADIN) 2 MG tablet   Oral   Take by mouth as directed.            Allergies Review of patient's allergies indicates no known allergies.  Family History  Problem Relation Age  of Onset  . Heart attack Father     Social History Social History  Substance Use Topics  . Smoking status: Never Smoker   . Smokeless tobacco: None  . Alcohol Use: No    Review of Systems Constitutional: Feverish Eyes: No visual changes. ENT: No sore throat. Cardiovascular: Denies chest pain except as noted in history of present illness. Respiratory: Denies shortness of breath at present but does feel more winded over the last 2 days with walking. Gastrointestinal: No abdominal pain.  No nausea, no vomiting.   Genitourinary: Negative for dysuria. Musculoskeletal: Negative for back pain. Skin: Negative for rash. Neurological: Negative for headaches, focal weakness or numbness.  10-point ROS otherwise negative.  ____________________________________________   PHYSICAL EXAM:  VITAL SIGNS: ED Triage Vitals  Enc Vitals Group     BP 12/16/15 1348 162/59 mmHg     Pulse Rate 12/16/15 1348 53     Resp 12/16/15 1348 20     Temp 12/16/15 1348 97.9 F (36.6 C)     Temp Source 12/16/15 1348 Oral     SpO2 12/16/15 1348 98 %     Weight 12/16/15 1348 174  lb (78.926 kg)     Height 12/16/15 1348 5\' 5"  (1.651 m)     Head Cir --      Peak Flow --      Pain Score 12/16/15 1348 8     Pain Loc --      Pain Edu? --      Excl. in [place]? --    Constitutional: Alert and oriented. Well appearing and in no acute distress.Patient is very friendly, escorted by her niece both very amicable and pleasant. Eyes: Conjunctivae are normal. PERRL. EOMI. Head: Atraumatic. Nose: No congestion/rhinnorhea. Mouth/Throat: Mucous membranes are slightly dry.  Oropharynx non-erythematous. Neck: No stridor.   Cardiovascular: Irregular rhythm, slightly bradycardic with heart rate 55-50. Grossly normal heart sounds.  Good peripheral circulation. Respiratory: Normal respiratory effort.  No retractions. Lungs CTA on the right, however there is slight crackles noted in the left lower lobe. No wheezing. No evidence of respiratory distress.. Gastrointestinal: Soft and nontender. No distention. No abdominal bruits. No CVA tenderness. Musculoskeletal: No lower extremity tenderness nor edema.  No joint effusions. Neurologic:  Normal speech and language. No gross focal neurologic deficits are appreciated. Skin:  Skin is warm, dry and intact. No rash noted. Psychiatric: Mood and affect are normal. Speech and behavior are normal.  ____________________________________________   LABS (all labs ordered are listed, but only abnormal results are displayed)  Labs Reviewed  BASIC METABOLIC PANEL - Abnormal; Notable for the following:    Glucose, Bld 113 (*)    Calcium 8.4 (*)    GFR calc non Af Amer 54 (*)    All other components within normal limits  CBC - Abnormal; Notable for the following:    RDW 14.8 (*)    All other components within normal limits  URINALYSIS COMPLETEWITH MICROSCOPIC ([facility] ONLY) - Abnormal; Notable for the following:    Color, Urine YELLOW (*)    APPearance CLEAR (*)    Hgb urine dipstick 1+ (*)    Squamous Epithelial / LPF 0-5 (*)    All other components  within normal limits  PROTIME-INR - Abnormal; Notable for the following:    Prothrombin Time 20.9 (*)    All other components within normal limits  TROPONIN I - Abnormal; Notable for the following:    Troponin I 0.04 (*)    All other  components within normal limits  RAPID INFLUENZA A&B ANTIGENS ([facility] ONLY)   ____________________________________________  EKG  Reviewed and interpreted by me at 1400 Ventricular rate 60 QRS 90 QTc 470 Atrial fibrillation, nonspecific downward scooping seen across multiple leads. Nonspecific. Compared with the patient's previous EKG from August 2015 this appears to be unchanged. No evidence of new acute ischemic abnormality. ____________________________________________  [id number]  DG Chest 2 View (Final result) Result time: 12/16/15 16:41:52   Final result by Rad Results In Interface (12/16/15 16:41:52)   Narrative:   CLINICAL DATA: Chest side and rib tenderness shortness of breath and generalized weakness for 2 days.  EXAM: CHEST 2 VIEW  COMPARISON: 02/27/2013  FINDINGS: Hyperexpansion is consistent with emphysema. Interstitial markings are diffusely coarsened with chronic features. Cardiopericardial silhouette is at upper limits of normal for size. The visualized bony structures of the thorax are intact. Telemetry leads overlie the chest.  IMPRESSION: Borderline cardiomegaly with emphysema and chronic interstitial coarsening. No acute cardiopulmonary findings. A   Electronically Signed By: Misty Stanley M.D. On: 12/16/2015 16:41    ____________________________________________   PROCEDURES  Procedure(s) performed: None  Critical Care performed: No  ____________________________________________   INITIAL IMPRESSION / ASSESSMENT AND PLAN / ED COURSE  Pertinent labs & imaging results that were available during my care of the patient were reviewed by me and considered in my medical decision making (see chart for  details).  Patient presents for mild dyspnea with exertion, feeling feverish at times and also having aching pain and productive cough in the left mid to lower chest. Sister has developed the similar symptoms this week as well. Clinical exam does demonstrate some focal rales in the left lower chest, primarily suspicious for possible pneumonia. Given the time of season and symptoms, I do feel influenza testing and chest x-ray are warranted. She does not have any symptoms such as heavy chest pressure, radiating pain nausea vomiting diaphoresis or other concerns to suggest acute coronary syndrome. The patient is anticoagulated making the likelihood of pulmonary embolism low. She does not have ripping tearing or moving pain to suggest acute dissection. No evidence of pneumothorax.  The patient's hemodynamics are stable, she does not demonstrate hypoxia, and chest x-ray shows no clear or large infiltrate, however given her pulmonary exam and productive cough I will prescribe her azithromycin. We will not prescribe Levaquin as she is currently on Coumadin.  Discussed with the patient and her niece, both are very comfortable the plan for discharge. She will follow-up with her doctor early this week and understand she is to have her INR repeated in the next few days as well.  Careful return precautions advised. Patient does not have a fever, leukocytosis, or hypoxia to suggest a need for inpatient admission at this time. She is respiratory well in no distress. Probable bronchitis, viral syndrome or potentially mild early left lower lobe pneumonia. ____________________________________________   FINAL CLINICAL IMPRESSION(S) / ED DIAGNOSES  Final diagnoses:  Acute bronchitis, unspecified organism      Delman Kitten, MD 12/16/15 1752  Patient's troponin is just slightly elevated above a normal threshold. Her troponin is highly sensitive, and likely this does not represent acute coronary syndrome. Her EKG  does not demonstrate acute new change, and her symptoms appear to be most consistent with bronchitis or possible early pneumonia.  Delman Kitten, MD 12/16/15 [phone]

## 2016-04-22 ENCOUNTER — Ambulatory Visit
Admission: RE | Admit: 2016-04-22 | Discharge: 2016-04-22 | Disposition: A | Discharge status: Home / Self Care | Payer: Medicare Other | Source: Ambulatory Visit | Attending: Internal Medicine | Admitting: Internal Medicine

## 2016-04-22 ENCOUNTER — Other Ambulatory Visit: Payer: Self-pay | Admitting: Internal Medicine

## 2016-04-22 DIAGNOSIS — I672 Cerebral atherosclerosis: Secondary | ICD-10-CM | POA: Insufficient documentation

## 2016-04-22 DIAGNOSIS — R270 Ataxia, unspecified: Secondary | ICD-10-CM

## 2016-05-19 ENCOUNTER — Emergency Department: Payer: Medicare Other

## 2016-05-19 ENCOUNTER — Emergency Department
Admission: EM | Admit: 2016-05-19 | Discharge: 2016-05-19 | Disposition: A | Discharge status: Home / Self Care | Payer: Medicare Other | Attending: Emergency Medicine | Admitting: Emergency Medicine

## 2016-05-19 ENCOUNTER — Encounter: Payer: Self-pay | Admitting: Intensive Care

## 2016-05-19 DIAGNOSIS — W1789XA Other fall from one level to another, initial encounter: Secondary | ICD-10-CM | POA: Diagnosis not present

## 2016-05-19 DIAGNOSIS — N183 Chronic kidney disease, stage 3 (moderate): Secondary | ICD-10-CM | POA: Insufficient documentation

## 2016-05-19 DIAGNOSIS — Z7901 Long term (current) use of anticoagulants: Secondary | ICD-10-CM | POA: Diagnosis not present

## 2016-05-19 DIAGNOSIS — Z853 Personal history of malignant neoplasm of breast: Secondary | ICD-10-CM | POA: Insufficient documentation

## 2016-05-19 DIAGNOSIS — I130 Hypertensive heart and chronic kidney disease with heart failure and stage 1 through stage 4 chronic kidney disease, or unspecified chronic kidney disease: Secondary | ICD-10-CM | POA: Diagnosis not present

## 2016-05-19 DIAGNOSIS — Y92512 Supermarket, store or market as the place of occurrence of the external cause: Secondary | ICD-10-CM | POA: Diagnosis not present

## 2016-05-19 DIAGNOSIS — I5030 Unspecified diastolic (congestive) heart failure: Secondary | ICD-10-CM | POA: Insufficient documentation

## 2016-05-19 DIAGNOSIS — S0592XA Unspecified injury of left eye and orbit, initial encounter: Secondary | ICD-10-CM | POA: Diagnosis present

## 2016-05-19 DIAGNOSIS — S0232XA Fracture of orbital floor, left side, initial encounter for closed fracture: Secondary | ICD-10-CM | POA: Diagnosis not present

## 2016-05-19 DIAGNOSIS — S0990XA Unspecified injury of head, initial encounter: Secondary | ICD-10-CM | POA: Diagnosis not present

## 2016-05-19 DIAGNOSIS — E1122 Type 2 diabetes mellitus with diabetic chronic kidney disease: Secondary | ICD-10-CM | POA: Diagnosis not present

## 2016-05-19 DIAGNOSIS — S0285XA Fracture of orbit, unspecified, initial encounter for closed fracture: Secondary | ICD-10-CM

## 2016-05-19 DIAGNOSIS — Y999 Unspecified external cause status: Secondary | ICD-10-CM | POA: Insufficient documentation

## 2016-05-19 DIAGNOSIS — Y9389 Activity, other specified: Secondary | ICD-10-CM | POA: Diagnosis not present

## 2016-05-19 LAB — CBC
HCT: 39.8 % (ref 35.0–47.0)
Hemoglobin: 13.2 g/dL (ref 12.0–16.0)
MCH: 30 pg (ref 26.0–34.0)
MCHC: 33.2 g/dL (ref 32.0–36.0)
MCV: 90.4 fL (ref 80.0–100.0)
PLATELETS: 318 10*3/uL (ref 150–440)
RBC: 4.4 MIL/uL (ref 3.80–5.20)
RDW: 15 % — ABNORMAL HIGH (ref 11.5–14.5)
WBC: 11.2 10*3/uL — ABNORMAL HIGH (ref 3.6–11.0)

## 2016-05-19 LAB — COMPREHENSIVE METABOLIC PANEL
ALT: 18 U/L (ref 14–54)
ANION GAP: 13 (ref 5–15)
AST: 40 U/L (ref 15–41)
Albumin: 4.6 g/dL (ref 3.5–5.0)
Alkaline Phosphatase: 81 U/L (ref 38–126)
BUN: 13 mg/dL (ref 6–20)
CHLORIDE: 101 mmol/L (ref 101–111)
CO2: 23 mmol/L (ref 22–32)
Calcium: 9.3 mg/dL (ref 8.9–10.3)
Creatinine, Ser: 0.91 mg/dL (ref 0.44–1.00)
GFR calc non Af Amer: 56 mL/min — ABNORMAL LOW (ref >60)
Glucose, Bld: 137 mg/dL — ABNORMAL HIGH (ref 65–99)
Potassium: 3.9 mmol/L (ref 3.5–5.1)
SODIUM: 137 mmol/L (ref 135–145)
Total Bilirubin: 1.1 mg/dL (ref 0.3–1.2)
Total Protein: 8.2 g/dL — ABNORMAL HIGH (ref 6.5–8.1)

## 2016-05-19 LAB — PROTIME-INR
INR: 1.68
Prothrombin Time: 20 seconds — ABNORMAL HIGH (ref 11.4–15.2)

## 2016-05-19 MED ORDER — CEPHALEXIN 500 MG PO CAPS
500.0000 mg | ORAL_CAPSULE | Freq: Once | ORAL | Status: AC
  Administered 2016-05-19: 500 mg via ORAL
  Filled 2016-05-19: qty 1

## 2016-05-19 MED ORDER — CEPHALEXIN 500 MG PO CAPS: 500.0000 mg | ORAL_CAPSULE | Freq: Three times a day (TID) | ORAL | 0 refills | Status: DC

## 2016-05-19 NOTE — ED Triage Notes (Signed)
Patient arrived by EMS from [employer]. Patient was shopping at [employer] and went to reach on the top shelf and fell to the ground on her L side. Patient thinks her fall was due to her shoes. Patient has hematoma and small laceration to L eye lid. Small laceration also noted to L cheek. Patient has deformity noted to L wrist from previous injury. Patient takes warfarin. Patient was able to ambulate from ems stretcher to bed. A&O x4. HX A fib. EMS vitals blood sugar 135, 98% RA

## 2016-05-19 NOTE — ED Notes (Signed)
Patient transported to CT 

## 2016-05-19 NOTE — Discharge Instructions (Signed)
Please follow-up with your eye doctor soon as possible for further evaluation. You have been diagnosed with an orbital floor fracture, but it appears fairly mild and you should recover well without incident. If you have any visual changes, decreased vision or increased eye pain please return to the emergency department for further evaluation. Please take your antibiotics as prescribed.

## 2016-05-19 NOTE — ED Provider Notes (Signed)
[facility] Emergency Department Provider Note  Time seen: 8:41 PM  I have reviewed the triage vital signs and the nursing notes.   HISTORY  Chief Complaint Fall    HPI Tammie Mcmahon is a 82 y.o. female with a past medical history of a fibrillation on Coumadin, CK D, diabetes, hypertension, hyperlipidemia, who presents the emergency department after a fall. According to the patient she was at the grocery store when she reached up to try to grab a drink she fell backwards, ultimately landing on the left side of her face. Patient suffered a small 2 cm laceration to left eyebrow, denies LOC nausea or vomiting. Patient denies any other injuries besides mild pain to her left wrist. Denies headache or facial pain. Denies focal deficits.  Past Medical History:  Diagnosis Date  . (HFpEF) heart failure with preserved ejection fraction ([place])   . Atrial fibrillation and flutter ([place])   . Breast cancer ([place])   . CKD (chronic kidney disease), stage III   . Diabetes mellitus without complication ([place])   . Hyperlipidemia   . Hypertension   . Low back pain   . Menopausal and postmenopausal disorder   . PA (pernicious anemia)   . Thyroid disease   . Valvular heart disease     There are no active problems to display for this patient.   Past Surgical History:  Procedure Laterality Date  . BREAST SURGERY    . GALLBLADDER SURGERY    . THYROIDECTOMY      Prior to Admission medications   Medication Sig Start Date End Date Taking? Authorizing Provider  azithromycin (ZITHROMAX) 250 MG tablet Take 1 tablet by mouth dailys starting 12/17/2015 12/16/15   Delman Kitten, MD  diltiazem (CARTIA XT) 180 MG 24 hr capsule Take 180 mg by mouth 2 (two) times daily.  01/20/14   Historical Provider, MD  DULoxetine (CYMBALTA) 60 MG capsule take 1 capsule by mouth once daily 06/26/14   Historical Provider, MD  fluticasone (FLONASE) 50 MCG/ACT nasal spray 1 spray as needed.  01/01/14    Historical Provider, MD  furosemide (LASIX) 20 MG tablet Take 20 mg by mouth daily.    Historical Provider, MD  levothyroxine (SYNTHROID, LEVOTHROID) 125 MCG tablet Take 125 mcg by mouth daily before breakfast.    Historical Provider, MD  metoprolol (TOPROL-XL) 200 MG 24 hr tablet Take 200 mg by mouth daily.  11/07/13   Historical Provider, MD  potassium chloride (MICRO-K) 10 MEQ CR capsule Take 10 mEq by mouth 2 (two) times daily. take 1 capsule by mouth twice a day 04/10/14   Historical Provider, MD  pravastatin (PRAVACHOL) 40 MG tablet take 1 tablet by mouth EVERY NIGHT 05/19/14   Historical Provider, MD  warfarin (COUMADIN) 2 MG tablet Take by mouth as directed.  12/26/13   Historical Provider, MD    No Known Allergies  Family History  Problem Relation Age of Onset  . Heart attack Father     Social History Social History  Substance Use Topics  . Smoking status: Never Smoker  . Smokeless tobacco: Not on file  . Alcohol use No    Review of Systems Constitutional: Negative for fever. Cardiovascular: Negative for chest pain. Respiratory: Negative for shortness of breath. Gastrointestinal: Negative for abdominal pain Musculoskeletal: Negative for back pain.Negative neck pain. Neurological: Negative for headaches, focal weakness or numbness. 10-point ROS otherwise negative.  ____________________________________________   PHYSICAL EXAM:  VITAL SIGNS: ED Triage Vitals  Enc Vitals Group  BP 05/19/16 2039 (!) 181/124     Pulse Rate 05/19/16 2039 99     Resp 05/19/16 2039 17     Temp 05/19/16 2039 97.4 F (36.3 C)     Temp Source 05/19/16 2039 Oral     SpO2 05/19/16 2039 97 %     Weight 05/19/16 2041 175 lb 1.6 oz (79.4 kg)     Height 05/19/16 2041 5\' 5"  (1.651 m)     Head Circumference --      Peak Flow --      Pain Score --      Pain Loc --      Pain Edu? --      Excl. in [place]? --     Constitutional: Alert and oriented. Well appearing and in no distress. Eyes: Normal  exam ENT   Head: 2 similar laceration left eyebrow, hemostatic and nontender gaping.   Mouth/Throat: Mucous membranes are moist. Cardiovascular: Normal rate, regular rhythm. No murmur Respiratory: Normal respiratory effort without tachypnea nor retractions. Breath sounds are clear Gastrointestinal: Soft and nontender. No distention.   Musculoskeletal: Nontender with normal range of motion in all extremities.  Neurologic:  Normal speech and language. No gross focal neurologic deficits Skin:  Skin is warm, dry and intact.  Psychiatric: Mood and affect are normal. Speech and behavior are normal.   ____________________________________________    EKG  EKG reviewed and interpreted by myself shows atrial flutter with a 2 to one block at 101 bpm, narrow QRS, normal axis, nonspecific ST changes without ST elevation.  ____________________________________________    RADIOLOGY  CT head shows no intracranial abnormality but possible orbital fracture recommends CT max face. CT max face shows inferior orbital wall fracture with minimal displacement area  ____________________________________________   INITIAL IMPRESSION / ASSESSMENT AND PLAN / ED COURSE  Pertinent labs & imaging results that were available during my care of the patient were reviewed by me and considered in my medical decision making (see chart for details).  The patient presents in the emergency department after a fall. Patient denies syncope. Denies chest pain at any point. Patient has atrial flutter/fibrillation on EKG unchanged from baseline. We will check labs including INR, obtaining a CT scan of the head and repair the patient's laceration with Dermabond that it is hemostatic and not gaping.  CT consistent with inferior orbital wall fracture minimal displacement. Patient has no entrapment on exam, no hyphema, normal vision. Laceration repaired with Dermabond, remains hemostatic. Discussed the patient and Dr. Kathyrn Sheriff,  who recommends ophthalmology follow-up is states nothing to do from an ENT standpoint. Patient has an ophthalmologist she will call tomorrow to arrange appointment. Will discharge on Keflex. Patient's tetanus is up-to-date last year.  ____________________________________________   FINAL CLINICAL IMPRESSION(S) / ED DIAGNOSES  Fall Head injury Laceration Orbital fracture   Harvest Dark, MD 05/19/16 2300

## 2016-09-09 ENCOUNTER — Emergency Department: Payer: Medicare Other

## 2016-09-09 ENCOUNTER — Observation Stay
Admission: EM | Admit: 2016-09-09 | Discharge: 2016-09-15 | Disposition: A | Discharge status: Home / Self Care | Payer: Medicare Other | Attending: Internal Medicine | Admitting: Internal Medicine

## 2016-09-09 DIAGNOSIS — Z79899 Other long term (current) drug therapy: Secondary | ICD-10-CM | POA: Insufficient documentation

## 2016-09-09 DIAGNOSIS — I4891 Unspecified atrial fibrillation: Secondary | ICD-10-CM | POA: Diagnosis present

## 2016-09-09 DIAGNOSIS — R262 Difficulty in walking, not elsewhere classified: Secondary | ICD-10-CM

## 2016-09-09 DIAGNOSIS — Z853 Personal history of malignant neoplasm of breast: Secondary | ICD-10-CM | POA: Diagnosis not present

## 2016-09-09 DIAGNOSIS — Z7951 Long term (current) use of inhaled steroids: Secondary | ICD-10-CM | POA: Diagnosis not present

## 2016-09-09 DIAGNOSIS — I482 Chronic atrial fibrillation: Secondary | ICD-10-CM | POA: Insufficient documentation

## 2016-09-09 DIAGNOSIS — N183 Chronic kidney disease, stage 3 (moderate): Secondary | ICD-10-CM | POA: Insufficient documentation

## 2016-09-09 DIAGNOSIS — Z791 Long term (current) use of non-steroidal anti-inflammatories (NSAID): Secondary | ICD-10-CM | POA: Insufficient documentation

## 2016-09-09 DIAGNOSIS — R410 Disorientation, unspecified: Secondary | ICD-10-CM | POA: Diagnosis present

## 2016-09-09 DIAGNOSIS — E860 Dehydration: Secondary | ICD-10-CM | POA: Diagnosis present

## 2016-09-09 DIAGNOSIS — D510 Vitamin B12 deficiency anemia due to intrinsic factor deficiency: Secondary | ICD-10-CM | POA: Diagnosis not present

## 2016-09-09 DIAGNOSIS — E1122 Type 2 diabetes mellitus with diabetic chronic kidney disease: Secondary | ICD-10-CM | POA: Diagnosis not present

## 2016-09-09 DIAGNOSIS — M6281 Muscle weakness (generalized): Secondary | ICD-10-CM

## 2016-09-09 DIAGNOSIS — G9341 Metabolic encephalopathy: Principal | ICD-10-CM | POA: Insufficient documentation

## 2016-09-09 DIAGNOSIS — I5032 Chronic diastolic (congestive) heart failure: Secondary | ICD-10-CM | POA: Diagnosis not present

## 2016-09-09 DIAGNOSIS — I130 Hypertensive heart and chronic kidney disease with heart failure and stage 1 through stage 4 chronic kidney disease, or unspecified chronic kidney disease: Secondary | ICD-10-CM | POA: Diagnosis not present

## 2016-09-09 DIAGNOSIS — E890 Postprocedural hypothyroidism: Secondary | ICD-10-CM | POA: Diagnosis not present

## 2016-09-09 DIAGNOSIS — I639 Cerebral infarction, unspecified: Secondary | ICD-10-CM

## 2016-09-09 DIAGNOSIS — E1151 Type 2 diabetes mellitus with diabetic peripheral angiopathy without gangrene: Secondary | ICD-10-CM | POA: Diagnosis not present

## 2016-09-09 DIAGNOSIS — Z8249 Family history of ischemic heart disease and other diseases of the circulatory system: Secondary | ICD-10-CM | POA: Diagnosis not present

## 2016-09-09 DIAGNOSIS — I700 Atherosclerosis of aorta: Secondary | ICD-10-CM | POA: Insufficient documentation

## 2016-09-09 DIAGNOSIS — Z9889 Other specified postprocedural states: Secondary | ICD-10-CM | POA: Diagnosis not present

## 2016-09-09 DIAGNOSIS — E1142 Type 2 diabetes mellitus with diabetic polyneuropathy: Secondary | ICD-10-CM | POA: Diagnosis not present

## 2016-09-09 DIAGNOSIS — Z7901 Long term (current) use of anticoagulants: Secondary | ICD-10-CM | POA: Insufficient documentation

## 2016-09-09 DIAGNOSIS — E785 Hyperlipidemia, unspecified: Secondary | ICD-10-CM | POA: Diagnosis not present

## 2016-09-09 LAB — CBC
HEMATOCRIT: 38.1 % (ref 35.0–47.0)
Hemoglobin: 12.9 g/dL (ref 12.0–16.0)
MCH: 29.9 pg (ref 26.0–34.0)
MCHC: 33.9 g/dL (ref 32.0–36.0)
MCV: 88.1 fL (ref 80.0–100.0)
PLATELETS: 283 10*3/uL (ref 150–440)
RBC: 4.32 MIL/uL (ref 3.80–5.20)
RDW: 15 % — AB (ref 11.5–14.5)
WBC: 10.8 10*3/uL (ref 3.6–11.0)

## 2016-09-09 LAB — COMPREHENSIVE METABOLIC PANEL
ALBUMIN: 4.1 g/dL (ref 3.5–5.0)
ALT: 17 U/L (ref 14–54)
AST: 39 U/L (ref 15–41)
Alkaline Phosphatase: 59 U/L (ref 38–126)
Anion gap: 13 (ref 5–15)
BILIRUBIN TOTAL: 0.9 mg/dL (ref 0.3–1.2)
BUN: 16 mg/dL (ref 6–20)
CHLORIDE: 102 mmol/L (ref 101–111)
CO2: 25 mmol/L (ref 22–32)
Calcium: 8.9 mg/dL (ref 8.9–10.3)
Creatinine, Ser: 1.06 mg/dL — ABNORMAL HIGH (ref 0.44–1.00)
GFR calc Af Amer: 54 mL/min — ABNORMAL LOW (ref >60)
GFR calc non Af Amer: 46 mL/min — ABNORMAL LOW (ref >60)
GLUCOSE: 133 mg/dL — AB (ref 65–99)
POTASSIUM: 3.3 mmol/L — AB (ref 3.5–5.1)
Sodium: 140 mmol/L (ref 135–145)
Total Protein: 7.3 g/dL (ref 6.5–8.1)

## 2016-09-09 LAB — TSH: TSH: 0.082 u[IU]/mL — ABNORMAL LOW (ref 0.350–4.500)

## 2016-09-09 MED ORDER — LORAZEPAM 2 MG/ML IJ SOLN
INTRAMUSCULAR | Status: AC
  Administered 2016-09-09: 1 mg
  Filled 2016-09-09: qty 1

## 2016-09-09 MED ORDER — SODIUM CHLORIDE 0.9 % IV BOLUS (SEPSIS)
1000.0000 mL | Freq: Once | INTRAVENOUS | Status: AC
Start: 2016-09-09 — End: 2016-09-10
  Administered 2016-09-09: 1000 mL via INTRAVENOUS

## 2016-09-09 NOTE — ED Notes (Signed)
Patient transported to CT 

## 2016-09-09 NOTE — ED Triage Notes (Signed)
Pt presents to ED with confusion. Similar symptoms a year ago but resolved on its own. Pt not able to follow simple instructions in triage (confused about how to sit in a chair). Pt niece states she is not recognizing family members and yesterday after ordering a meal she didn't eat it and said she never ordered it. Pt eyes closed during triage with no increased work of breathing or acute distress noted at this time.

## 2016-09-09 NOTE — ED Notes (Signed)
Pt brought in by family from home with altered mental status.  Pt confused to people, time and place.    Pt awake but not able to answer questions.   Iv started and labs sent.

## 2016-09-09 NOTE — ED Notes (Signed)
Ct scan called pt was fighting staff and was not able to lay still for scan.  meds given and pt brought back to er for eval again.  afib on monitor.  Pt calmer now after meds.  Family with pt.  siderails up x 2.  Iv fluids infusing.

## 2016-09-10 ENCOUNTER — Observation Stay: Payer: Medicare Other

## 2016-09-10 ENCOUNTER — Emergency Department: Payer: Medicare Other

## 2016-09-10 ENCOUNTER — Encounter: Payer: Self-pay | Admitting: Student

## 2016-09-10 DIAGNOSIS — R410 Disorientation, unspecified: Secondary | ICD-10-CM | POA: Diagnosis present

## 2016-09-10 DIAGNOSIS — G9341 Metabolic encephalopathy: Secondary | ICD-10-CM | POA: Diagnosis not present

## 2016-09-10 LAB — URINALYSIS, COMPLETE (UACMP) WITH MICROSCOPIC
BACTERIA UA: NONE SEEN
BILIRUBIN URINE: NEGATIVE
GLUCOSE, UA: NEGATIVE mg/dL
KETONES UR: NEGATIVE mg/dL
LEUKOCYTES UA: NEGATIVE
NITRITE: NEGATIVE
PH: 6 (ref 5.0–8.0)
Protein, ur: NEGATIVE mg/dL
SPECIFIC GRAVITY, URINE: 1.005 (ref 1.005–1.030)

## 2016-09-10 LAB — URINE DRUG SCREEN, QUALITATIVE (ARMC ONLY)
Amphetamines, Ur Screen: NOT DETECTED
BARBITURATES, UR SCREEN: NOT DETECTED
Benzodiazepine, Ur Scrn: NOT DETECTED
COCAINE METABOLITE, UR ~~LOC~~: NOT DETECTED
Cannabinoid 50 Ng, Ur ~~LOC~~: NOT DETECTED
MDMA (Ecstasy)Ur Screen: NOT DETECTED
METHADONE SCREEN, URINE: NOT DETECTED
OPIATE, UR SCREEN: NOT DETECTED
PHENCYCLIDINE (PCP) UR S: NOT DETECTED
Tricyclic, Ur Screen: NOT DETECTED

## 2016-09-10 LAB — T4, FREE: Free T4: 1.38 ng/dL — ABNORMAL HIGH (ref 0.61–1.12)

## 2016-09-10 LAB — ETHANOL: Alcohol, Ethyl (B): <5 mg/dL (ref <5)

## 2016-09-10 LAB — PROTIME-INR
INR: 2.47
Prothrombin Time: 27.2 seconds — ABNORMAL HIGH (ref 11.4–15.2)

## 2016-09-10 LAB — LACTIC ACID, PLASMA: Lactic Acid, Venous: 1.5 mmol/L (ref 0.5–1.9)

## 2016-09-10 MED ORDER — POTASSIUM CHLORIDE IN NACL 20-0.9 MEQ/L-% IV SOLN
INTRAVENOUS | Status: DC
  Administered 2016-09-10 – 2016-09-15 (×8): via INTRAVENOUS
  Filled 2016-09-10 (×11): qty 1000

## 2016-09-10 MED ORDER — DILTIAZEM HCL 30 MG PO TABS
60.0000 mg | ORAL_TABLET | Freq: Three times a day (TID) | ORAL | Status: DC
  Administered 2016-09-10 – 2016-09-12 (×5): 60 mg via ORAL
  Filled 2016-09-10 (×6): qty 2

## 2016-09-10 MED ORDER — METOPROLOL TARTRATE 25 MG PO TABS
25.0000 mg | ORAL_TABLET | Freq: Once | ORAL | Status: AC
  Administered 2016-09-10: 25 mg via ORAL
  Filled 2016-09-10: qty 1

## 2016-09-10 MED ORDER — ENOXAPARIN SODIUM 40 MG/0.4ML ~~LOC~~ SOLN
40.0000 mg | SUBCUTANEOUS | Status: DC
  Administered 2016-09-10: 40 mg via SUBCUTANEOUS
  Filled 2016-09-10: qty 0.4

## 2016-09-10 MED ORDER — ONDANSETRON HCL 4 MG PO TABS: 4.0000 mg | ORAL_TABLET | Freq: Four times a day (QID) | ORAL | Status: DC | PRN

## 2016-09-10 MED ORDER — HYDRALAZINE HCL 20 MG/ML IJ SOLN: 10.0000 mg | Freq: Four times a day (QID) | INTRAMUSCULAR | Status: DC | PRN

## 2016-09-10 MED ORDER — ACETAMINOPHEN 650 MG RE SUPP: 650.0000 mg | Freq: Four times a day (QID) | RECTAL | Status: DC | PRN

## 2016-09-10 MED ORDER — FLUTICASONE PROPIONATE 50 MCG/ACT NA SUSP: 1.0000 | Freq: Every day | NASAL | Status: DC | PRN

## 2016-09-10 MED ORDER — WARFARIN SODIUM 2 MG PO TABS
4.0000 mg | ORAL_TABLET | Freq: Every day | ORAL | Status: DC
  Administered 2016-09-10 – 2016-09-11 (×2): 4 mg via ORAL
  Filled 2016-09-10 (×2): qty 2

## 2016-09-10 MED ORDER — DOCUSATE SODIUM 100 MG PO CAPS
100.0000 mg | ORAL_CAPSULE | Freq: Two times a day (BID) | ORAL | Status: DC
Start: 2016-09-10 — End: 2016-09-15
  Administered 2016-09-10 – 2016-09-15 (×9): 100 mg via ORAL
  Filled 2016-09-10 (×9): qty 1

## 2016-09-10 MED ORDER — ACETAMINOPHEN 325 MG PO TABS
650.0000 mg | ORAL_TABLET | Freq: Four times a day (QID) | ORAL | Status: DC | PRN
  Administered 2016-09-10: 650 mg via ORAL
  Filled 2016-09-10 (×2): qty 2

## 2016-09-10 MED ORDER — ONDANSETRON HCL 4 MG/2ML IJ SOLN: 4.0000 mg | Freq: Four times a day (QID) | INTRAMUSCULAR | Status: DC | PRN

## 2016-09-10 MED ORDER — DICLOFENAC SODIUM 1 % TD GEL
2.0000 g | Freq: Four times a day (QID) | TRANSDERMAL | Status: DC
  Administered 2016-09-10 – 2016-09-14 (×9): 2 g via TOPICAL
  Filled 2016-09-10: qty 100

## 2016-09-10 MED ORDER — METOPROLOL TARTRATE 5 MG/5ML IV SOLN
5.0000 mg | Freq: Once | INTRAVENOUS | Status: DC
  Filled 2016-09-10: qty 5

## 2016-09-10 MED ORDER — LEVOTHYROXINE SODIUM 50 MCG PO TABS
50.0000 ug | ORAL_TABLET | Freq: Every day | ORAL | Status: DC
  Administered 2016-09-12 – 2016-09-13 (×2): 50 ug via ORAL
  Filled 2016-09-10 (×2): qty 1

## 2016-09-10 MED ORDER — WARFARIN SODIUM 2 MG PO TABS: 1.0000 mg | ORAL_TABLET | ORAL | Status: DC

## 2016-09-10 MED ORDER — METOPROLOL TARTRATE 5 MG/5ML IV SOLN
5.0000 mg | Freq: Once | INTRAVENOUS | Status: AC
  Administered 2016-09-10: 5 mg via INTRAVENOUS
  Filled 2016-09-10: qty 5

## 2016-09-10 MED ORDER — TORSEMIDE 20 MG PO TABS
40.0000 mg | ORAL_TABLET | Freq: Every day | ORAL | Status: DC
  Administered 2016-09-12 – 2016-09-15 (×4): 40 mg via ORAL
  Filled 2016-09-10 (×5): qty 2

## 2016-09-10 MED ORDER — PRAVASTATIN SODIUM 40 MG PO TABS
40.0000 mg | ORAL_TABLET | Freq: Every day | ORAL | Status: DC
  Administered 2016-09-10 – 2016-09-14 (×5): 40 mg via ORAL
  Filled 2016-09-10 (×5): qty 1

## 2016-09-10 MED ORDER — DULOXETINE HCL 60 MG PO CPEP
60.0000 mg | ORAL_CAPSULE | Freq: Every day | ORAL | Status: DC
  Administered 2016-09-12 – 2016-09-15 (×4): 60 mg via ORAL
  Filled 2016-09-10 (×4): qty 1

## 2016-09-10 MED ORDER — PANTOPRAZOLE SODIUM 40 MG PO TBEC
40.0000 mg | DELAYED_RELEASE_TABLET | Freq: Every day | ORAL | Status: DC
  Administered 2016-09-12 – 2016-09-15 (×4): 40 mg via ORAL
  Filled 2016-09-10 (×4): qty 1

## 2016-09-10 MED ORDER — MIDAZOLAM HCL 2 MG/2ML IJ SOLN
2.0000 mg | Freq: Once | INTRAMUSCULAR | Status: AC
  Administered 2016-09-10: 2 mg via INTRAVENOUS
  Filled 2016-09-10: qty 2

## 2016-09-10 MED ORDER — METOPROLOL SUCCINATE ER 100 MG PO TB24
100.0000 mg | ORAL_TABLET | Freq: Every day | ORAL | Status: DC
  Administered 2016-09-12 – 2016-09-15 (×4): 100 mg via ORAL
  Filled 2016-09-10 (×5): qty 1

## 2016-09-10 MED ORDER — POTASSIUM CHLORIDE CRYS ER 20 MEQ PO TBCR
20.0000 meq | EXTENDED_RELEASE_TABLET | Freq: Every day | ORAL | Status: DC
  Administered 2016-09-12 – 2016-09-15 (×4): 20 meq via ORAL
  Filled 2016-09-10 (×4): qty 1

## 2016-09-10 MED ORDER — WARFARIN - PHYSICIAN DOSING INPATIENT
Freq: Every day | Status: DC
  Administered 2016-09-10 – 2016-09-14 (×2)

## 2016-09-10 MED ORDER — VITAMIN D 1000 UNITS PO TABS
1000.0000 [IU] | ORAL_TABLET | Freq: Every day | ORAL | Status: DC
  Administered 2016-09-12 – 2016-09-15 (×4): 1000 [IU] via ORAL
  Filled 2016-09-10 (×4): qty 1

## 2016-09-10 MED ORDER — ORAL CARE MOUTH RINSE
15.0000 mL | Freq: Two times a day (BID) | OROMUCOSAL | Status: DC
  Administered 2016-09-10 – 2016-09-14 (×5): 15 mL via OROMUCOSAL

## 2016-09-10 NOTE — H&P (Signed)
Tammie Mcmahon is an 82 y.o. female.   Chief Complaint: Altered mental status HPI: The patient with past medical history of atrial fibrillation and diabetes as well as hypothyroidism presents emergency department with mental status changes. The patient's niece states that the patient became confused yesterday afternoon but became progressively more confused for the evening. In the emergency department she became agitated as well as we were trying to obtain a CT of her head. It showed no acute intracranial process. Laboratory evaluation was significant for severely depressed TSH. Vital signs also significant for tachycardia. The patient's niece denies hearing any complaints from her aunt regarding chest pain or shortness of breath. The patient has been eating well. She is usually very independent. She did not contribute to her history as she is very somnolent secondary to sedation and the time of the evening for admission. For the effort mentioned reasons emergency department staff called the hospitalist service for further management.  Past Medical History:  Diagnosis Date  . (HFpEF) heart failure with preserved ejection fraction ([place])   . Atrial fibrillation and flutter ([place])   . Breast cancer ([place])   . CKD (chronic kidney disease), stage III   . Diabetes mellitus without complication ([place])   . Hyperlipidemia   . Hypertension   . Low back pain   . Menopausal and postmenopausal disorder   . PA (pernicious anemia)   . Thyroid disease   . Valvular heart disease     Past Surgical History:  Procedure Laterality Date  . BREAST SURGERY    . GALLBLADDER SURGERY    . MASTECTOMY Left   . THYROIDECTOMY      Family History  Problem Relation Age of Onset  . Heart attack Father    Social History:  reports that she has never smoked. She has never used smokeless tobacco. She reports that she does not drink alcohol or use drugs.  Allergies: No Known Allergies  Medications Prior to  Admission  Medication Sig Dispense Refill  . cholecalciferol (VITAMIN D) 1000 units tablet Take 1,000 Units by mouth daily.    . diclofenac sodium (VOLTAREN) 1 % GEL Apply topically 4 (four) times daily.    . DULoxetine (CYMBALTA) 60 MG capsule Take 60 mg by mouth daily. take 1 capsule by mouth once daily    . fluticasone (FLONASE) 50 MCG/ACT nasal spray Place 1 spray into both nostrils daily as needed for allergies.     Marland Kitchen levothyroxine (SYNTHROID, LEVOTHROID) 100 MCG tablet Take 100 mcg by mouth daily before breakfast.    . metoprolol succinate (TOPROL-XL) 100 MG 24 hr tablet Take 100 mg by mouth daily. Take with or immediately following a meal.    . omeprazole (PRILOSEC) 20 MG capsule Take 20 mg by mouth daily.    . potassium chloride (MICRO-K) 10 MEQ CR capsule Take 10 mEq by mouth 2 (two) times daily. take 1 capsule by mouth twice a day    . pravastatin (PRAVACHOL) 40 MG tablet take 1 tablet by mouth EVERY NIGHT    . torsemide (DEMADEX) 20 MG tablet Take 40 mg by mouth daily.    Marland Kitchen warfarin (COUMADIN) 1 MG tablet Take 1 mg by mouth as directed.    . warfarin (COUMADIN) 2 MG tablet Take 4 mg by mouth daily.     Marland Kitchen azithromycin (ZITHROMAX) 250 MG tablet Take 1 tablet by mouth dailys starting 12/17/2015 (Patient not taking: Reported on 09/09/2016) 4 tablet 0  . cephALEXin (KEFLEX) 500 MG capsule Take  1 capsule (500 mg total) by mouth 3 (three) times daily. (Patient not taking: Reported on 09/09/2016) 15 capsule 0    Results for orders placed or performed during the hospital encounter of 09/09/16 (from the past 48 hour(s))  CBC     Status: Abnormal   Collection Time: 09/09/16 11:00 PM  Result Value Ref Range   WBC 10.8 3.6 - 11.0 K/uL   RBC 4.32 3.80 - 5.20 MIL/uL   Hemoglobin 12.9 12.0 - 16.0 g/dL   HCT 38.1 35.0 - 47.0 %   MCV 88.1 80.0 - 100.0 fL   MCH 29.9 26.0 - 34.0 pg   MCHC 33.9 32.0 - 36.0 g/dL   RDW 15.0 (H) 11.5 - 14.5 %   Platelets 283 150 - 440 K/uL  Comprehensive metabolic  panel     Status: Abnormal   Collection Time: 09/09/16 11:00 PM  Result Value Ref Range   Sodium 140 135 - 145 mmol/L   Potassium 3.3 (L) 3.5 - 5.1 mmol/L   Chloride 102 101 - 111 mmol/L   CO2 25 22 - 32 mmol/L   Glucose, Bld 133 (H) 65 - 99 mg/dL   BUN 16 6 - 20 mg/dL   Creatinine, Ser 1.06 (H) 0.44 - 1.00 mg/dL   Calcium 8.9 8.9 - 10.3 mg/dL   Total Protein 7.3 6.5 - 8.1 g/dL   Albumin 4.1 3.5 - 5.0 g/dL   AST 39 15 - 41 U/L   ALT 17 14 - 54 U/L   Alkaline Phosphatase 59 38 - 126 U/L   Total Bilirubin 0.9 0.3 - 1.2 mg/dL   GFR calc non Af Amer 46 (L) >60 mL/min   GFR calc Af Amer 54 (L) >60 mL/min    Comment: (NOTE) The eGFR has been calculated using the CKD EPI equation. This calculation has not been validated in all clinical situations. eGFR's persistently <60 mL/min signify possible Chronic Kidney Disease.    Anion gap 13 5 - 15  Ethanol     Status: None   Collection Time: 09/09/16 11:00 PM  Result Value Ref Range   Alcohol, Ethyl (B) <5 <5 mg/dL    Comment:        LOWEST DETECTABLE LIMIT FOR SERUM ALCOHOL IS 5 mg/dL FOR MEDICAL PURPOSES ONLY   TSH     Status: Abnormal   Collection Time: 09/09/16 11:00 PM  Result Value Ref Range   TSH 0.082 (L) 0.350 - 4.500 uIU/mL    Comment: Performed by a 3rd Generation assay with a functional sensitivity of <=0.01 uIU/mL.  Urinalysis, Complete w Microscopic     Status: Abnormal   Collection Time: 09/10/16 12:24 AM  Result Value Ref Range   Color, Urine STRAW (A) YELLOW   APPearance CLEAR (A) CLEAR   Specific Gravity, Urine 1.005 1.005 - 1.030   pH 6.0 5.0 - 8.0   Glucose, UA NEGATIVE NEGATIVE mg/dL   Hgb urine dipstick SMALL (A) NEGATIVE   Bilirubin Urine NEGATIVE NEGATIVE   Ketones, ur NEGATIVE NEGATIVE mg/dL   Protein, ur NEGATIVE NEGATIVE mg/dL   Nitrite NEGATIVE NEGATIVE   Leukocytes, UA NEGATIVE NEGATIVE   RBC / HPF 0-5 0 - 5 RBC/hpf   WBC, UA 0-5 0 - 5 WBC/hpf   Bacteria, UA NONE SEEN NONE SEEN   Squamous  Epithelial / LPF 0-5 (A) NONE SEEN  Urine Drug Screen, Qualitative ([facility] only)     Status: None   Collection Time: 09/10/16 12:24 AM  Result Value Ref Range  Tricyclic, Ur Screen NONE DETECTED NONE DETECTED   Amphetamines, Ur Screen NONE DETECTED NONE DETECTED   MDMA (Ecstasy)Ur Screen NONE DETECTED NONE DETECTED   Cocaine Metabolite,Ur [place] NONE DETECTED NONE DETECTED   Opiate, Ur Screen NONE DETECTED NONE DETECTED   Phencyclidine (PCP) Ur S NONE DETECTED NONE DETECTED   Cannabinoid 50 Ng, Ur [place] NONE DETECTED NONE DETECTED   Barbiturates, Ur Screen NONE DETECTED NONE DETECTED   Benzodiazepine, Ur Scrn NONE DETECTED NONE DETECTED   Methadone Scn, Ur NONE DETECTED NONE DETECTED    Comment: (NOTE) 790  Tricyclics, urine               Cutoff 1000 ng/mL 200  Amphetamines, urine             Cutoff 1000 ng/mL 300  MDMA (Ecstasy), urine           Cutoff 500 ng/mL 400  Cocaine Metabolite, urine       Cutoff 300 ng/mL 500  Opiate, urine                   Cutoff 300 ng/mL 600  Phencyclidine (PCP), urine      Cutoff 25 ng/mL 700  Cannabinoid, urine              Cutoff 50 ng/mL 800  Barbiturates, urine             Cutoff 200 ng/mL 900  Benzodiazepine, urine           Cutoff 200 ng/mL 1000 Methadone, urine                Cutoff 300 ng/mL 1100 1200 The urine drug screen provides only a preliminary, unconfirmed 1300 analytical test result and should not be used for non-medical 1400 purposes. Clinical consideration and professional judgment should 1500 be applied to any positive drug screen result due to possible 1600 interfering substances. A more specific alternate chemical method 1700 must be used in order to obtain a confirmed analytical result.  1800 Gas chromato graphy / mass spectrometry (GC/MS) is the preferred 1900 confirmatory method.   Lactic acid, plasma     Status: None   Collection Time: 09/10/16 12:24 AM  Result Value Ref Range   Lactic Acid, Venous 1.5 0.5 - 1.9 mmol/L  T4,  free     Status: Abnormal   Collection Time: 09/10/16  5:48 AM  Result Value Ref Range   Free T4 1.38 (H) 0.61 - 1.12 ng/dL    Comment: (NOTE) Biotin ingestion may interfere with free T4 tests. If the results are inconsistent with the TSH level, previous test results, or the clinical presentation, then consider biotin interference. If needed, order repeat testing after stopping biotin.   Protime-INR     Status: Abnormal   Collection Time: 09/10/16  5:48 AM  Result Value Ref Range   Prothrombin Time 27.2 (H) 11.4 - 15.2 seconds   INR 2.47    Ct Head Wo Contrast  Result Date: 09/10/2016 CLINICAL DATA:  Altered mental status, confusion. EXAM: CT HEAD WITHOUT CONTRAST TECHNIQUE: Contiguous axial images were obtained from the base of the skull through the vertex without intravenous contrast. COMPARISON:  Head CT 05/19/2016 FINDINGS: Mild motion artifact despite repeated attempts and medication. Brain: No evidence of acute infarction, hemorrhage, hydrocephalus, extra-axial collection or mass lesion/mass effect. Stable atrophy and chronic small vessel ischemia. Again seen basal gangliar calcifications. Vascular: Atherosclerosis of skullbase vasculature. No hyperdense vessel. Skull: Normal. Negative for fracture or focal  lesion. Sinuses/Orbits: Motion artifact limits assessment. No paranasal sinus inflammation. Other: None. IMPRESSION: No evidence of acute intracranial abnormality allowing for mild motion artifact. Stable atrophy and chronic small vessel ischemia. Electronically Signed   By: Jeb Levering M.D.   On: 09/10/2016 02:04   Dg Chest Portable 1 View  Result Date: 09/09/2016 CLINICAL DATA:  Altered mental status. EXAM: PORTABLE CHEST 1 VIEW COMPARISON:  Frontal and lateral views 12/16/2015 FINDINGS: Lung volumes are low. Unchanged cardiomediastinal contours with borderline cardiomegaly. Atherosclerosis of the aortic arch. Mild right basilar atelectasis or scarring. Chronic interstitial  coarsening. No evidence of pneumonia, pleural fluid or pneumothorax. Osseous structures are unchanged. IMPRESSION: 1. Low lung volumes with right basilar atelectasis. 2. Chronic interstitial coarsening. 3. Atherosclerosis of the thoracic aorta. Electronically Signed   By: Jeb Levering M.D.   On: 09/09/2016 23:36    Review of Systems  Unable to perform ROS: Mental status change    Blood pressure (!) 144/76, pulse 92, temperature 98.2 F (36.8 C), temperature source Oral, resp. rate 20, height _0  (1.676 m), weight 76.4 kg (168 lb 6.4 oz), SpO2 99 %. Physical Exam  Vitals reviewed. Constitutional: She is oriented to person, place, and time. She appears well-developed and well-nourished. No distress.  HENT:  Head: Normocephalic and atraumatic.  Mouth/Throat: Oropharynx is clear and moist.  Eyes: Conjunctivae and EOM are normal. Pupils are equal, round, and reactive to light. No scleral icterus.  Neck: Normal range of motion. Neck supple. No JVD present. No tracheal deviation present. No thyromegaly present.  Cardiovascular: Normal rate, regular rhythm and normal heart sounds.  Exam reveals no gallop and no friction rub.   No murmur heard. Respiratory: Effort normal and breath sounds normal. No respiratory distress.  GI: Soft. Bowel sounds are normal. She exhibits no distension. There is no tenderness.  Genitourinary:  Genitourinary Comments: Deferred  Musculoskeletal: Normal range of motion. She exhibits edema (trace).  Lymphadenopathy:    She has no cervical adenopathy.  Neurological: She is alert and oriented to person, place, and time. No cranial nerve deficit. She exhibits normal muscle tone.  Skin: Skin is warm and dry. No rash noted. No erythema.  Psychiatric: She has a normal mood and affect. Her behavior is normal. Judgment and thought content normal.     Assessment/Plan This is an 82 year old female admitted for confusion. 1. Confusion: Likely secondary to thyrotoxicosis.  I suspect she has become confused regarding medication dosing and may have increased her Synthroid administration by accident. Also, the patient's niece reports that her antiarrhythmic medication for A. fib has been decreased. Decreased cerebral blood flow may also contribute to her confusion. I have held Synthroid. Recheck TSH in a few days. If normal will resume thyroid hormone administration. 2. Atrial fibrillation: Rate controlled after metoprolol dosing. We'll continue metoprolol per home regimen. The patient had been taking diltiazem 180 mg twice a day number of years ago. It is unclear when exactly this regimen was discontinued. However, in light of her persistent fluctuations to heart rates greater than 115, I have restarted diltiazem 60 mg every 8 hours. Continue warfarin. 3. Heart failure: The patient preserved ejection fraction with only minimal atrial enlargement per her last echo. She has no signs or symptoms of florid heart failure at this time. Her niece admits the patient routinely has trace lower extremity edema which I find on current physical exam. Continue with torsemide per home regimen. 4. Hyperlipidemia: Continue statin therapy 5. Neuropathy: Continue Cymbalta 6. DT prophylaxis: Full  dose anticoagulation as above 7. GI prophylaxis: Continue pantoprazole per home regimen The patient is a full code. Time spent on admission orders and patient care possibly 45 minutes  Harrie Foreman, MD 09/10/2016, 7:06 AM

## 2016-09-10 NOTE — ED Notes (Signed)
Pt back to ct scan.  Pt sleeping but arousable.

## 2016-09-10 NOTE — ED Notes (Signed)
Pt resting better with family but still agitated,  md aware.

## 2016-09-10 NOTE — ED Notes (Signed)
Pt sleeping.  afib on monitor at 105.   Family with pt.  siderails up x 2.

## 2016-09-10 NOTE — Clinical Social Work Note (Signed)
CSW consulted for additional needs in the home. RN CM will address and re consult CSW if needed. Shela Leff MSW,LCSW [phone]

## 2016-09-10 NOTE — ED Notes (Signed)
Dr. Diamond in to see pt.  

## 2016-09-10 NOTE — Progress Notes (Signed)
[place] [facility] [place] NAME: Tammie Mcmahon    MR#:  [id number]  DATE OF BIRTH:  1927/12/07  SUBJECTIVE:  Per niece pt was confused. Did not recognize family and repeating same thing over again  REVIEW OF SYSTEMS:   Review of Systems  Unable to perform ROS: Mental status change   Tolerating Diet:yes Tolerating PT: pending  DRUG ALLERGIES:  No Known Allergies  VITALS:  Blood pressure (!) 151/78, pulse 91, temperature 99.9 F (37.7 C), temperature source Oral, resp. rate 17, height 5\' 6"  (1.676 m), weight 76.4 kg (168 lb 6.4 oz), SpO2 95 %.  PHYSICAL EXAMINATION:   Physical Exam  GENERAL:  82 y.o.-year-old patient lying in the bed with no acute distress. obese EYES: Pupils equal, round, reactive to light and accommodation. No scleral icterus. Extraocular muscles intact.  HEENT: Head atraumatic, normocephalic. Oropharynx and nasopharynx clear.  NECK:  Supple, no jugular venous distention. No thyroid enlargement, no tenderness.  LUNGS: Normal breath sounds bilaterally, no wheezing, rales, rhonchi. No use of accessory muscles of respiration.  CARDIOVASCULAR: S1, S2 normal. No murmurs, rubs, or gallops.  ABDOMEN: Soft, nontender, nondistended. Bowel sounds present. No organomegaly or mass.  EXTREMITIES: No cyanosis, clubbing or edema b/l.    NEUROLOGIC:  No focal Motor or sensory deficits b/l.   PSYCHIATRIC:  Waxing and wanningSKIN: No obvious rash, lesion, or ulcer.   LABORATORY PANEL:  CBC  Recent Labs Lab 09/09/16 2300  WBC 10.8  HGB 12.9  HCT 38.1  PLT 283    Chemistries   Recent Labs Lab 09/09/16 2300  NA 140  K 3.3*  CL 102  CO2 25  GLUCOSE 133*  BUN 16  CREATININE 1.06*  CALCIUM 8.9  AST 39  ALT 17  ALKPHOS 59  BILITOT 0.9   Cardiac Enzymes No results for input(s): TROPONINI in the last 168 hours. RADIOLOGY:  Ct Head Wo Contrast  Result Date: 09/10/2016 CLINICAL DATA:  Altered mental status,  confusion. EXAM: CT HEAD WITHOUT CONTRAST TECHNIQUE: Contiguous axial images were obtained from the base of the skull through the vertex without intravenous contrast. COMPARISON:  Head CT 05/19/2016 FINDINGS: Mild motion artifact despite repeated attempts and medication. Brain: No evidence of acute infarction, hemorrhage, hydrocephalus, extra-axial collection or mass lesion/mass effect. Stable atrophy and chronic small vessel ischemia. Again seen basal gangliar calcifications. Vascular: Atherosclerosis of skullbase vasculature. No hyperdense vessel. Skull: Normal. Negative for fracture or focal lesion. Sinuses/Orbits: Motion artifact limits assessment. No paranasal sinus inflammation. Other: None. IMPRESSION: No evidence of acute intracranial abnormality allowing for mild motion artifact. Stable atrophy and chronic small vessel ischemia. Electronically Signed   By: Jeb Levering M.D.   On: 09/10/2016 02:04   Dg Chest Portable 1 View  Result Date: 09/09/2016 CLINICAL DATA:  Altered mental status. EXAM: PORTABLE CHEST 1 VIEW COMPARISON:  Frontal and lateral views 12/16/2015 FINDINGS: Lung volumes are low. Unchanged cardiomediastinal contours with borderline cardiomegaly. Atherosclerosis of the aortic arch. Mild right basilar atelectasis or scarring. Chronic interstitial coarsening. No evidence of pneumonia, pleural fluid or pneumothorax. Osseous structures are unchanged. IMPRESSION: 1. Low lung volumes with right basilar atelectasis. 2. Chronic interstitial coarsening. 3. Atherosclerosis of the thoracic aorta. Electronically Signed   By: Jeb Levering M.D.   On: 09/09/2016 23:36   ASSESSMENT AND PLAN:   82 year old female admitted for confusion. 1. Confusion: unclear etio -CT head neg -has h/o afib with therap INR ?CVA -did not tolerated MRI -PT to see  pt -no source of infection -clinically euvolemic -?neurology eval  2. Atrial fibrillation: Rate controlled after metoprolol dosing. We'll  continue metoprolol per home regimen. have restarted diltiazem 60 mg every 8 hours. Continue warfarin.  3. H/o Heart failure: The patient preserved ejection fraction with only minimal atrial enlargement per her last echo. She has no signs or symptoms of florid heart failure at this time. - Continue with torsemide per home regimen.  4. Hyperlipidemia: Continue statin therapy  5. Neuropathy: Continue Cymbalta  6. DT prophylaxis: Full dose anticoagulation as above  CSW for d/c planning  D/w niece in the room  Case discussed with Care Management/Social Worker. Management plans discussed with the patient, family and they are in agreement.  CODE STATUS:Full  DVT Prophylaxis: coumadin  TOTAL TIME TAKING CARE OF THIS PATIENT: 30 minutes.  >50% time spent on counselling and coordination of care  POSSIBLE D/C IN 1-2 DAYS, DEPENDING ON CLINICAL CONDITION.  Note: This dictation was prepared with Dragon dictation along with smaller phrase technology. Any transcriptional errors that result from this process are unintentional.  Escher Harr M.D on 09/10/2016 at 8:43 PM  Between 7am to 6pm - Pager - [phone]  After 6pm go to [email or web address] - password [email or web address] [facility]  Office  [phone]  CC: Primary care physician; Kirk Ruths., MD

## 2016-09-10 NOTE — ED Provider Notes (Signed)
[facility] Emergency Department Provider Note   ____________________________________________   First MD Initiated Contact with Patient 09/09/16 2357     (approximate)  I have reviewed the triage vital signs and the nursing notes.   HISTORY  Chief Complaint Altered Mental Status    HPI Tammie Mcmahon is a 82 y.o. female who was brought in tonight by her family. They report that they don't know what's going on with her. The patient normally lives at home alone and cares for herself. They report that she knows everyone is very close with her family. They state though that she did not know who they were today. She also seems to be very confused about what is going on. The patient was taken out to eat yesterday. They report that as they were waiting for their food the patient asked why they hadn't left and why they were waiting for the check. They report that she had even eaten but didn't realize it. They report then that today she lost her phone and would not go to her doctor's appointment. She had a headache and had some bizarre behavior. The family realized when she did not recognize them at the door that something was wrong. The family was concerned so they decided to bring her in. The patient has no other complaints. She denies any chest pain any shortness of breath any dizziness any nausea or vomiting. The family is unsure she's taking her medications appropriately.   Past Medical History:  Diagnosis Date  . (HFpEF) heart failure with preserved ejection fraction ([place])   . Atrial fibrillation and flutter ([place])   . Breast cancer ([place])   . CKD (chronic kidney disease), stage III   . Diabetes mellitus without complication ([place])   . Hyperlipidemia   . Hypertension   . Low back pain   . Menopausal and postmenopausal disorder   . PA (pernicious anemia)   . Thyroid disease   . Valvular heart disease     Patient Active Problem List   Diagnosis Date  Noted  . Confusion 09/10/2016    Past Surgical History:  Procedure Laterality Date  . BREAST SURGERY    . GALLBLADDER SURGERY    . MASTECTOMY Left   . THYROIDECTOMY      Prior to Admission medications   Medication Sig Start Date End Date Taking? Authorizing Provider  cholecalciferol (VITAMIN D) 1000 units tablet Take 1,000 Units by mouth daily.   Yes Historical Provider, MD  diclofenac sodium (VOLTAREN) 1 % GEL Apply topically 4 (four) times daily.   Yes Historical Provider, MD  DULoxetine (CYMBALTA) 60 MG capsule Take 60 mg by mouth daily. take 1 capsule by mouth once daily   Yes Historical Provider, MD  fluticasone (FLONASE) 50 MCG/ACT nasal spray Place 1 spray into both nostrils daily as needed for allergies.    Yes Historical Provider, MD  levothyroxine (SYNTHROID, LEVOTHROID) 100 MCG tablet Take 100 mcg by mouth daily before breakfast.   Yes Historical Provider, MD  metoprolol succinate (TOPROL-XL) 100 MG 24 hr tablet Take 100 mg by mouth daily. Take with or immediately following a meal.   Yes Historical Provider, MD  omeprazole (PRILOSEC) 20 MG capsule Take 20 mg by mouth daily.   Yes Historical Provider, MD  potassium chloride (MICRO-K) 10 MEQ CR capsule Take 10 mEq by mouth 2 (two) times daily. take 1 capsule by mouth twice a day 04/10/14  Yes Historical Provider, MD  pravastatin (PRAVACHOL) 40 MG tablet  take 1 tablet by mouth EVERY NIGHT 05/19/14  Yes Historical Provider, MD  torsemide (DEMADEX) 20 MG tablet Take 40 mg by mouth daily.   Yes Historical Provider, MD  warfarin (COUMADIN) 1 MG tablet Take 1 mg by mouth as directed.   Yes Historical Provider, MD  warfarin (COUMADIN) 2 MG tablet Take 4 mg by mouth daily.    Yes Historical Provider, MD  azithromycin (ZITHROMAX) 250 MG tablet Take 1 tablet by mouth dailys starting 12/17/2015 Patient not taking: Reported on 09/09/2016 12/16/15   Delman Kitten, MD  cephALEXin (KEFLEX) 500 MG capsule Take 1 capsule (500 mg total) by mouth 3 (three)  times daily. Patient not taking: Reported on 09/09/2016 05/19/16   Harvest Dark, MD    Allergies Patient has no known allergies.  Family History  Problem Relation Age of Onset  . Heart attack Father     Social History Social History  Substance Use Topics  . Smoking status: Never Smoker  . Smokeless tobacco: Never Used  . Alcohol use No    Review of Systems Constitutional: No fever/chills Eyes: No visual changes. ENT: No sore throat. Cardiovascular: Denies chest pain. Respiratory: Denies shortness of breath. Gastrointestinal: No abdominal pain.  No nausea, no vomiting.  No diarrhea.  No constipation. Genitourinary: Negative for dysuria. Musculoskeletal: Negative for back pain. Skin: Negative for rash. Neurological: Confusion  10-point ROS otherwise negative.  ____________________________________________   PHYSICAL EXAM:  VITAL SIGNS: ED Triage Vitals [09/09/16 2238]  Enc Vitals Group     BP (!) 160/73     Pulse Rate (!) 41     Resp 18     Temp 97.9 F (36.6 C)     Temp Source Oral     SpO2 94 %     Weight      Height      Head Circumference      Peak Flow      Pain Score      Pain Loc      Pain Edu?      Excl. in [place]?     Constitutional: Alert and oriented To self. Well appearing and in Mild distress. Eyes: Conjunctivae are normal. PERRL. EOMI. Head: Atraumatic. Nose: No congestion/rhinnorhea. Mouth/Throat: Mucous membranes are dry.  Oropharynx non-erythematous. Cardiovascular: Normal rate, regular rhythm. Grossly normal heart sounds.  Good peripheral circulation. Respiratory: Normal respiratory effort.  No retractions. Lungs CTAB. Gastrointestinal: Soft and nontender. No distention. Positive bowel sounds Musculoskeletal: No lower extremity tenderness nor edema.   Neurologic:  Normal speech and language. Cranial nerves II through XII are grossly intact with no focal motor or neuro deficits. The patient did have a hard time following commands and  did need multiple prompts to follow through with the action she was performing. Skin:  Skin is warm, dry and intact. Marland Kitchen Psychiatric: Mood and affect are normal.   ____________________________________________   LABS (all labs ordered are listed, but only abnormal results are displayed)  Labs Reviewed  CBC - Abnormal; Notable for the following:       Result Value   RDW 15.0 (*)    All other components within normal limits  COMPREHENSIVE METABOLIC PANEL - Abnormal; Notable for the following:    Potassium 3.3 (*)    Glucose, Bld 133 (*)    Creatinine, Ser 1.06 (*)    GFR calc non Af Amer 46 (*)    GFR calc Af Amer 54 (*)    All other components within normal limits  URINALYSIS,  COMPLETE (UACMP) WITH MICROSCOPIC - Abnormal; Notable for the following:    Color, Urine STRAW (*)    APPearance CLEAR (*)    Hgb urine dipstick SMALL (*)    Squamous Epithelial / LPF 0-5 (*)    All other components within normal limits  TSH - Abnormal; Notable for the following:    TSH 0.082 (*)    All other components within normal limits  URINE DRUG SCREEN, QUALITATIVE ([facility] ONLY)  ETHANOL  LACTIC ACID, PLASMA  T4, FREE  PROTIME-INR   ____________________________________________  EKG  ED ECG REPORT I, Loney Hering, the attending physician, personally viewed and interpreted this ECG.   Date: 09/08/2016  EKG Time: 2247  Rate: 98  Rhythm: atrial fibrillation, rate 98  Axis: normal  Intervals:none  ST&T Change: ST depression in leads II, III, aVF, V4, V5, V6  ____________________________________________  RADIOLOGY  CT head CXR ____________________________________________   PROCEDURES  Procedure(s) performed: None  Procedures  Critical Care performed: No  ____________________________________________   INITIAL IMPRESSION / ASSESSMENT AND PLAN / ED COURSE  Pertinent labs & imaging results that were available during my care of the patient were reviewed by me and considered  in my medical decision making (see chart for details).  This is an 82 year old female who comes into the hospital today with some confusion. The patient initially had some fluid ordered by the physician who was leaving. According to the family she did seem to perk up after some fluid. The patient did become agitated going to CT so she did receive some Ativan and then some Versed. The patient was also tachycardic when I initially went into the room. Her heart rate was ranging between the 1 teens to the 130s. I did give the patient a dose of metoprolol orally as well as IV. The patient received a liter of normal saline. The patient's blood work is unremarkable aside from the [facility] which is very low. Given the patient's history of hypothyroid it should be higher or should be normal. There is a concern that the patient may be taking her medication incorrectly. The patient seems to have some lucid. The patient what's going on but is still confused. I'm concerned that she might have some thyrotoxicosis. I did add a free T4 onto the patient's blood work. The patient's tachycardia did improve with the metoprolol. It also did improve with the fluids. I will admit the patient to the hospitalist service for further evaluation. Again I feel that the patient may be missed managing her medication which is what's leading to her symptoms. She will be admitted for further evaluation. The patient's CT of her head is unremarkable as well as her chest x-ray.  Clinical Course as of Sep 10 432  Tue Sep 09, 2016  2356 1. Low lung volumes with right basilar atelectasis. 2. Chronic interstitial coarsening. 3. Atherosclerosis of the thoracic aorta.   DG Chest Portable 1 View [AW]  Wed Sep 10, 2016  [id number] No evidence of acute intracranial abnormality allowing for mild motion artifact.  Stable atrophy and chronic small vessel ischemia.   CT Head Wo Contrast [AW]    Clinical Course User Index [AW] Loney Hering, MD   I  will admit the patient to the hospitalist service for further evaluation of her confusion. I discussed this with the family and they agree with the plan as stated.  ____________________________________________   FINAL CLINICAL IMPRESSION(S) / ED DIAGNOSES  Final diagnoses:  Confusion  Dehydration  Atrial  fibrillation with RVR ([place])      NEW MEDICATIONS STARTED DURING THIS VISIT:  New Prescriptions   No medications on file     Note:  This document was prepared using Dragon voice recognition software and may include unintentional dictation errors.    Loney Hering, MD 09/10/16 [phone]

## 2016-09-10 NOTE — ED Notes (Signed)
Report off to lisa rn  

## 2016-09-10 NOTE — Progress Notes (Signed)
Pt refused MRI and was combative. MD notified and plan is to attempt MRI tomorrow and if needed will provide medication for procedure.

## 2016-09-11 ENCOUNTER — Observation Stay: Payer: Medicare Other

## 2016-09-11 DIAGNOSIS — G9341 Metabolic encephalopathy: Secondary | ICD-10-CM | POA: Diagnosis not present

## 2016-09-11 LAB — PROTIME-INR
INR: 2.57
Prothrombin Time: 28.1 seconds — ABNORMAL HIGH (ref 11.4–15.2)

## 2016-09-11 LAB — HEMOGLOBIN A1C
Hgb A1c MFr Bld: 6.4 % — ABNORMAL HIGH (ref 4.8–5.6)
Mean Plasma Glucose: 137 mg/dL

## 2016-09-11 MED ORDER — LORAZEPAM 2 MG/ML IJ SOLN
INTRAMUSCULAR | Status: AC
  Administered 2016-09-11: 1 mg via INTRAVENOUS
  Filled 2016-09-11: qty 1

## 2016-09-11 MED ORDER — LORAZEPAM 2 MG/ML IJ SOLN
1.0000 mg | Freq: Once | INTRAMUSCULAR | Status: AC
  Administered 2016-09-11: 1 mg via INTRAVENOUS

## 2016-09-11 NOTE — Care Management Obs Status (Signed)
MEDICARE OBSERVATION STATUS NOTIFICATION   Patient Details  Name: Tammie Mcmahon MRN: [id number] Date of Birth: [birth year]   Medicare Observation Status Notification Given:  Yes (Reviewed with niece via telephone. Copy left in room.  Copy sent to HIM)    Beverly Sessions, RN 09/11/2016, 10:09 AM

## 2016-09-11 NOTE — Progress Notes (Signed)
Received call from MRI about plans for MRI testing today and if that is the plan pt needs to be medicated. This RN called MD and MD preferred MRI to be done and ordered one time dose of ativan.

## 2016-09-11 NOTE — Care Management (Signed)
Patient admitted with confusion.  Assessment completed with niece via telephone.  At baseline patient lives alone, is A&O, independent, and drives. Niece and patient sister live locally for support.  Patient has shower chair, and cane in the home for ambulation. PCP [facility].  [facility].  RNCM following for discharge planning needs.

## 2016-09-11 NOTE — Discharge Instructions (Signed)

## 2016-09-11 NOTE — Progress Notes (Signed)
[name] NAME: Tammie Mcmahon    MR#:  [id number]  DATE OF BIRTH:  04/01/1928  SUBJECTIVE: Seen at bedside, more alert than yesterday.     REVIEW OF SYSTEMS:   Review of Systems  Constitutional: Negative for chills and fever.  HENT: Negative for hearing loss.   Eyes: Negative for blurred vision, double vision and photophobia.  Respiratory: Negative for cough, hemoptysis and shortness of breath.   Cardiovascular: Negative for palpitations, orthopnea and leg swelling.  Gastrointestinal: Negative for abdominal pain, diarrhea and vomiting.  Genitourinary: Negative for dysuria and urgency.  Musculoskeletal: Negative for myalgias and neck pain.  Skin: Negative for rash.  Neurological: Negative for dizziness, focal weakness, seizures, weakness and headaches.  Psychiatric/Behavioral: Negative for memory loss. The patient does not have insomnia.    Tolerating Diet:yes Tolerating PT: pending  DRUG ALLERGIES:  No Known Allergies  VITALS:  Blood pressure (!) 153/80, pulse (!) 111, temperature 98.3 F (36.8 C), temperature source Oral, resp. rate 19, height 5\' 6"  (1.676 m), weight 76.4 kg (168 lb 6.4 oz), SpO2 98 %.  PHYSICAL EXAMINATION:   Physical Exam  GENERAL:  82 y.o.-year-old patient lying in the bed with no acute distress. obese EYES: Pupils equal, round, reactive to light and accommodation. No scleral icterus. Extraocular muscles intact.  HEENT: Head atraumatic, normocephalic. Oropharynx and nasopharynx clear.  NECK:  Supple, no jugular venous distention. No thyroid enlargement, no tenderness.  LUNGS: Normal breath sounds bilaterally, no wheezing, rales, rhonchi. No use of accessory muscles of respiration.  CARDIOVASCULAR: S1, S2 normal. No murmurs, rubs, or gallops.  ABDOMEN: Soft, nontender, nondistended. Bowel sounds present. No organomegaly or mass.  EXTREMITIES: No cyanosis, clubbing or edema b/l.    NEUROLOGIC:   No focal Motor or sensory deficits b/l.   PSYCHIATRIC:  Waxing and wanningSKIN: No obvious rash, lesion, or ulcer.   LABORATORY PANEL:  CBC  Recent Labs Lab 09/09/16 2300  WBC 10.8  HGB 12.9  HCT 38.1  PLT 283    Chemistries   Recent Labs Lab 09/09/16 2300  NA 140  K 3.3*  CL 102  CO2 25  GLUCOSE 133*  BUN 16  CREATININE 1.06*  CALCIUM 8.9  AST 39  ALT 17  ALKPHOS 59  BILITOT 0.9   Cardiac Enzymes No results for input(s): TROPONINI in the last 168 hours. RADIOLOGY:  Ct Head Wo Contrast  Result Date: 09/10/2016 CLINICAL DATA:  Altered mental status, confusion. EXAM: CT HEAD WITHOUT CONTRAST TECHNIQUE: Contiguous axial images were obtained from the base of the skull through the vertex without intravenous contrast. COMPARISON:  Head CT 05/19/2016 FINDINGS: Mild motion artifact despite repeated attempts and medication. Brain: No evidence of acute infarction, hemorrhage, hydrocephalus, extra-axial collection or mass lesion/mass effect. Stable atrophy and chronic small vessel ischemia. Again seen basal gangliar calcifications. Vascular: Atherosclerosis of skullbase vasculature. No hyperdense vessel. Skull: Normal. Negative for fracture or focal lesion. Sinuses/Orbits: Motion artifact limits assessment. No paranasal sinus inflammation. Other: None. IMPRESSION: No evidence of acute intracranial abnormality allowing for mild motion artifact. Stable atrophy and chronic small vessel ischemia. Electronically Signed   By: Jeb Levering M.D.   On: 09/10/2016 02:04   Mr Brain Wo Contrast  Result Date: 09/11/2016 CLINICAL DATA:  Altered mental status with confusion. Atrial fibrillation. Diabetes. EXAM: MRI HEAD WITHOUT CONTRAST TECHNIQUE: Multiplanar, multiecho pulse sequences of the brain and surrounding structures were obtained without intravenous contrast. COMPARISON:  CT head 09/09/2016. FINDINGS:  Significant motion degradation. The study is of marginal diagnostic utility. Brain:  Advanced atrophy with chronic microvascular ischemic change. No restricted diffusion, visible hemorrhage, mass effect, or hydrocephalus. No extra-axial fluid collection is evident. Vascular: Grossly normal flow voids. Skull and upper cervical spine: Not assessed. Sinuses/Orbits: No visible layering fluid or orbital mass. Other: None. IMPRESSION: Atrophy and small vessel disease. No gross intracranial abnormality is detected. Electronically Signed   By: Staci Righter M.D.   On: 09/11/2016 10:53   Dg Chest Portable 1 View  Result Date: 09/09/2016 CLINICAL DATA:  Altered mental status. EXAM: PORTABLE CHEST 1 VIEW COMPARISON:  Frontal and lateral views 12/16/2015 FINDINGS: Lung volumes are low. Unchanged cardiomediastinal contours with borderline cardiomegaly. Atherosclerosis of the aortic arch. Mild right basilar atelectasis or scarring. Chronic interstitial coarsening. No evidence of pneumonia, pleural fluid or pneumothorax. Osseous structures are unchanged. IMPRESSION: 1. Low lung volumes with right basilar atelectasis. 2. Chronic interstitial coarsening. 3. Atherosclerosis of the thoracic aorta. Electronically Signed   By: Jeb Levering M.D.   On: 09/09/2016 23:36   ASSESSMENT AND PLAN:   82 year old female admitted for confusion. 1. 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 Patient has some sedative effect from Ativan but overall better than yesterday. Physical therapy saw the patient this morning recommended skilled nursing but family says that she lives alone and independent of activities of daily living before this happened.   2. Atrial fibrillation: Rate controlled after metoprolol dosing. We'll continue metoprolol per home regimen. have restarted diltiazem 60 mg every 8 hours. Continue warfarin.  3. H/o Heart failure: The patient preserved ejection fraction with only minimal atrial enlargement  per her last echo. She has no signs or symptoms of florid heart failure at this time. - Continue with torsemide per home regimen.  4. Hyperlipidemia: Continue statin therapy  5. Neuropathy: Hold the Cymbalta  6. DT prophylaxis: Full dose anticoagulation as above  CSW for d/c planning  D/w niece in the room  Case discussed with Care Management/Social Worker. Management plans discussed with the patient, family and they are in agreement.  CODE STATUS:Full  DVT Prophylaxis: coumadin  TOTAL TIME TAKING CARE OF THIS PATIENT: 30 minutes.  >50% time spent on counselling and coordination of care  POSSIBLE D/C IN 1-2 DAYS, DEPENDING ON CLINICAL CONDITION.  Note: This dictation was prepared with Dragon dictation along with smaller phrase technology. Any transcriptional errors that result from this process are unintentional.  Barnes Florek M.D on 09/11/2016 at 2:46 PM  Between 7am to 6pm - Pager - [phone]  After 6pm go to [email or web address] - password [email or web address] [place] [facility]  Office  [phone]  CC: Primary care physician; Kirk Ruths., MD

## 2016-09-11 NOTE — Evaluation (Signed)
Physical Therapy Evaluation Patient Details Name: Tammie Mcmahon MRN: [id number] DOB: Feb 15, 1928 Today's Date: 09/11/2016   History of Present Illness  82 y/o female with past medical history of atrial fibrillation and diabetes as well as hypothyroidism presented to ED with mental status changes. The patient's niece states that the patient became progressively more confused, she is here under observation status.  She remained confused during PT exam.  Clinical Impression  Pt here with confusion and remained confused t/o PT session.  Unsure as to the effect that Ativan (for MRI) had, but she was unsafe/unsteady t/o the effort of ambulation (and with basic standing balance) and generally was unable to follow instructions well or show any real safety awareness. She was able to do some limited ambulation but unless she is able to show more safety awareness and steadiness with mobility she will not be able to return home.     Follow Up Recommendations SNF (per progress, pt not safe to return home given PT exam)    Equipment Recommendations       Recommendations for Other Services       Precautions / Restrictions Precautions Precautions: Fall Restrictions Weight Bearing Restrictions: No      Mobility  Bed Mobility               General bed mobility comments: Pt in recliner on arrival, bed mobility not tested, though apparently she get herself to sitting independently this AM with nursing  Transfers Overall transfer level: Needs assistance Equipment used: Rolling walker (2 wheeled) Transfers: Sit to/from Stand Sit to Stand: Min assist         General transfer comment: Pt attempted to get to standing and fell back down to recliner X 2 before PT needed to assist to get to standing and she was able to stay upright with assist from the walker.   Ambulation/Gait Ambulation/Gait assistance: Min assist Ambulation Distance (Feet): 40 Feet Assistive device: Rolling walker  (2 wheeled)       General Gait Details: Pt with general lack of awareness and needed a lot of basic cuing to insure safety, direction and staying on task during the effort.  She had multiple small LOBs and stagger steps and it is likely that the Ativan she had taken for the MRI may have been a factor.  (Nurisng reports that she was able to do some in-room ambulation earlier today w/o AD)  Stairs            Wheelchair Mobility    Modified Rankin (Stroke Patients Only)       Balance Overall balance assessment: Needs assistance   Sitting balance-Leahy Scale: Fair       Standing balance-Leahy Scale: Poor Standing balance comment: Even with walker pt had multilple small stagger steps and moments where awareness/safety were lacking and she needed direct assist to remain upright                             Pertinent Vitals/Pain      Home Living Family/patient expects to be discharged to:: Private residence Living Arrangements: Alone Available Help at Discharge: Family             Additional Comments: Pt confused, unable to answer questions consistently - info here gathered from previous documentation    Prior Function           Comments: Apparently pt is generally active and somewhat independent  Hand Dominance        Extremity/Trunk Assessment   Upper Extremity Assessment: Generalized weakness;Difficult to assess due to impaired cognition           Lower Extremity Assessment: Generalized weakness;Difficult to assess due to impaired cognition         Communication   Communication:  (lethargic, confused)  Cognition Arousal/Alertness: Lethargic Behavior During Therapy: Flat affect Overall Cognitive Status: Difficult to assess                 General Comments: Pt struggled to consistently follow PT instructions, showed poor awareness    General Comments      Exercises     Assessment/Plan    PT Assessment Patient  needs continued PT services  PT Problem List Decreased strength;Decreased range of motion;Decreased activity tolerance;Decreased balance;Decreased safety awareness;Decreased knowledge of use of DME;Decreased coordination;Decreased cognition;Decreased mobility          PT Treatment Interventions DME instruction;Gait training;Stair training;Functional mobility training;Therapeutic activities;Therapeutic exercise;Balance training;Cognitive remediation;Patient/family education;Neuromuscular re-education    PT Goals (Current goals can be found in the Care Plan section)  Acute Rehab PT Goals Patient Stated Goal: unable to state PT Goal Formulation: Patient unable to participate in goal setting Time For Goal Achievement: 09/25/16 Potential to Achieve Goals: Fair    Frequency Min 2X/week   Barriers to discharge        Co-evaluation               End of Session Equipment Utilized During Treatment: Gait belt Activity Tolerance: Patient limited by lethargy Patient left: with bed alarm set;with call bell/phone within reach Nurse Communication: Mobility status    Functional Assessment Tool Used: clinical judgement Functional Limitation: Mobility: Walking and moving around Mobility: Walking and Moving Around Current Status [phone]): At least 40 percent but less than 60 percent impaired, limited or restricted Mobility: Walking and Moving Around Goal Status [phone]): At least 1 percent but less than 20 percent impaired, limited or restricted    Time: 0855-0910 PT Time Calculation (min) (ACUTE ONLY): 15 min   Charges:   PT Evaluation $PT Eval Low Complexity: 1 Procedure     PT G Codes:   PT G-Codes **NOT FOR INPATIENT CLASS** Functional Assessment Tool Used: clinical judgement Functional Limitation: Mobility: Walking and moving around Mobility: Walking and Moving Around Current Status [phone]): At least 40 percent but less than 60 percent impaired, limited or restricted Mobility:  Walking and Moving Around Goal Status [phone]): At least 1 percent but less than 20 percent impaired, limited or restricted    Kreg Shropshire, DPT 09/11/2016, 10:37 AM

## 2016-09-12 DIAGNOSIS — G9341 Metabolic encephalopathy: Secondary | ICD-10-CM | POA: Diagnosis not present

## 2016-09-12 LAB — PROTIME-INR
INR: 2.66
Prothrombin Time: 28.9 seconds — ABNORMAL HIGH (ref 11.4–15.2)

## 2016-09-12 MED ORDER — DILTIAZEM HCL 30 MG PO TABS
60.0000 mg | ORAL_TABLET | Freq: Four times a day (QID) | ORAL | Status: DC
  Administered 2016-09-12 – 2016-09-15 (×12): 60 mg via ORAL
  Filled 2016-09-12 (×12): qty 2

## 2016-09-12 MED ORDER — WARFARIN SODIUM 3 MG PO TABS
3.0000 mg | ORAL_TABLET | Freq: Every day | ORAL | Status: DC
  Administered 2016-09-12 – 2016-09-14 (×3): 3 mg via ORAL
  Filled 2016-09-12 (×3): qty 1

## 2016-09-12 NOTE — Progress Notes (Signed)
[name] NAME: Tammie Mcmahon    MR#:  [id number]  DATE OF BIRTH:  1927-12-14  SUBJECTIVE: Seen at bedside,  Some confusion noted by registered nurse but overall are much better than yesterday. Sitting on the edge of the bed to eat breakfast. But still tachycardic, elevated blood pressure  Noted.       Review of Systems  Constitutional: Negative for chills and fever.  HENT: Negative for hearing loss.   Eyes: Negative for blurred vision, double vision and photophobia.  Respiratory: Negative for cough, hemoptysis and shortness of breath.   Cardiovascular: Negative for palpitations, orthopnea and leg swelling.  Gastrointestinal: Negative for abdominal pain, diarrhea and vomiting.  Genitourinary: Negative for dysuria and urgency.  Musculoskeletal: Negative for myalgias and neck pain.  Skin: Negative for rash.  Neurological: Negative for dizziness, focal weakness, seizures, weakness and headaches.  Psychiatric/Behavioral: Negative for memory loss. The patient does not have insomnia.    Tolerating Diet:yes Tolerating PT: pending  DRUG ALLERGIES:  No Known Allergies  VITALS:  Blood pressure (!) 167/86, pulse (!) 118, temperature 98.6 F (37 C), temperature source Oral, resp. rate 18, height 5\' 6"  (1.676 m), weight 75.9 kg (167 lb 6.4 oz), SpO2 96 %.  PHYSICAL EXAMINATION:   Physical Exam  GENERAL:  82 y.o.-year-old patient lying in the bed with no acute distress. obese EYES: Pupils equal, round, reactive to light and accommodation. No scleral icterus. Extraocular muscles intact.  HEENT: Head atraumatic, normocephalic. Oropharynx and nasopharynx clear.  NECK:  Supple, no jugular venous distention. No thyroid enlargement, no tenderness.  LUNGS: Normal breath sounds bilaterally, no wheezing, rales, rhonchi. No use of accessory muscles of respiration.  CARDIOVASCULAR: S1, S2 normal. No murmurs, rubs, or gallops.  ABDOMEN:  Soft, nontender, nondistended. Bowel sounds present. No organomegaly or mass.  EXTREMITIES: No cyanosis, clubbing or edema b/l.    NEUROLOGIC:  No focal Motor or sensory deficits b/l.   PSYCHIATRIC:  Waxing and wanningSKIN: No obvious rash, lesion, or ulcer.   LABORATORY PANEL:  CBC  Recent Labs Lab 09/09/16 2300  WBC 10.8  HGB 12.9  HCT 38.1  PLT 283    Chemistries   Recent Labs Lab 09/09/16 2300  NA 140  K 3.3*  CL 102  CO2 25  GLUCOSE 133*  BUN 16  CREATININE 1.06*  CALCIUM 8.9  AST 39  ALT 17  ALKPHOS 59  BILITOT 0.9   Cardiac Enzymes No results for input(s): TROPONINI in the last 168 hours. RADIOLOGY:  Mr Brain Wo Contrast  Result Date: 09/11/2016 CLINICAL DATA:  Altered mental status with confusion. Atrial fibrillation. Diabetes. EXAM: MRI HEAD WITHOUT CONTRAST TECHNIQUE: Multiplanar, multiecho pulse sequences of the brain and surrounding structures were obtained without intravenous contrast. COMPARISON:  CT head 09/09/2016. FINDINGS: Significant motion degradation. The study is of marginal diagnostic utility. Brain: Advanced atrophy with chronic microvascular ischemic change. No restricted diffusion, visible hemorrhage, mass effect, or hydrocephalus. No extra-axial fluid collection is evident. Vascular: Grossly normal flow voids. Skull and upper cervical spine: Not assessed. Sinuses/Orbits: No visible layering fluid or orbital mass. Other: None. IMPRESSION: Atrophy and small vessel disease. No gross intracranial abnormality is detected. Electronically Signed   By: Staci Righter M.D.   On: 09/11/2016 10:53   ASSESSMENT AND PLAN:   82 year old female admitted for confusion. 1.  Metabolic encephalopathy likely secondary to severe hyperthyroidism,/dehydration;;stop synthyroid,iv hydration MRI did not show any stroke. Physical therapy recommended home health  physical therapy. Patient to likely be discharged home with home health tomorrow..   2. Atrial fibrillation:  Rate controlled after metoprolol dosing. We'll continue metoprolol per home regimen. have restarted diltiazem 60 mg every 8 hours. Continue warfarin. Tachycardic, has elevated blood pressure,; adjust Cardizem.   3. H/o Heart failure: The patient preserved ejection fraction with only minimal atrial enlargement per her last echo.  She has no signs or symptoms of florid heart failure at this time.   - Continue with torsemide per home regimen.  4. Hyperlipidemia: Continue statin therapy  5. Neuropathy: Hold the Cymbalta due to acute encephalopathy. 6. DT prophylaxis: Full dose anticoagulation as above Likely discharged tomorrow. D/w niece in the room  Case discussed with Care Management/Social Worker. Management plans discussed with the patient, family and they are in agreement.  CODE STATUS:Full  DVT Prophylaxis: coumadin  TOTAL TIME TAKING CARE OF THIS PATIENT: 30 minutes.  >50% time spent on counselling and coordination of care  POSSIBLE D/C IN 1-2 DAYS, DEPENDING ON CLINICAL CONDITION.  Note: This dictation was prepared with Dragon dictation along with smaller phrase technology. Any transcriptional errors that result from this process are unintentional.  Maricella Filyaw M.D on 09/12/2016 at 12:18 PM  Between 7am to 6pm - Pager - [phone]  After 6pm go to [email or web address] - password [email or web address] [facility]  Office  [phone]  CC: Primary care physician; Kirk Ruths., MD

## 2016-09-12 NOTE — Care Management (Signed)
Patient confusion has improved today.  Niece was at bedside earlier today.  Plan for patient to discharge with home health at appropriated.  Agency preference provided.  Patient does not have preference.  Corene Cornea with [facility] given heads up referral.  Patient would benefit for RN for medication management, and PT.  RNCM following for discharge planning.

## 2016-09-12 NOTE — Progress Notes (Signed)
[place] rounding the unit visited Pt in Rm 225. Pt appeared to be in good spirit, Pt talked about family, she was looking forward to return home, and was very optimistic about life, and requested for prayers, which the Ch, provided.     09/12/16 1600  Clinical Encounter Type  Visited With Patient  Visit Type Initial;Spiritual support  Referral From Nurse  Consult/Referral To Chaplain  Spiritual Encounters  Spiritual Needs Prayer;Other (Comment)

## 2016-09-12 NOTE — Progress Notes (Signed)
Physical Therapy Treatment Patient Details Name: Aidah Holiman Wieser MRN: [id number] DOB: 25-Apr-1928 Today's Date: 09/12/2016    History of Present Illness 82 y/o female with past medical history of atrial fibrillation and diabetes as well as hypothyroidism presented to ED with mental status changes. The patient's niece states that the patient became progressively more confused, she is here under observation status.     PT Comments    Pt does much better with PT today and was able to circumambulate the nurses' station w/o AD and though she was slower than baseline she had no LOBs or significant safety issues.  She had only minimal fatigue with the effort and showed ability to go home with intermittent assistance.  Family states they can do this and help with medicines (which was the niece's primary concern about going home).    Follow Up Recommendations  Home health PT, intermittent supervision     Equipment Recommendations       Recommendations for Other Services       Precautions / Restrictions Precautions Precautions: Fall Restrictions Weight Bearing Restrictions: No    Mobility  Bed Mobility Overal bed mobility: Independent             General bed mobility comments: Pt showed good ability and confidence with getting from supine to sit w/o assist  Transfers Overall transfer level: Modified independent Equipment used: Rolling walker (2 wheeled) Transfers: Sit to/from Stand Sit to Stand: Supervision         General transfer comment: Pt did well getting to stand and was not overly reliant on the walker to maintain balance in standing.    Ambulation/Gait Ambulation/Gait assistance: Supervision Ambulation Distance (Feet): 200 Feet Assistive device: None (Pt did first 25 ft with walker, then no AD)       General Gait Details: Pt did much better with ambulation today and was able to walk w/o LOBs or signficant safety concerns w/o AD and generally did much better  than yesterday.  Pt with only minimal fatigue with the effort, reports still being minimally slower than baseline.   Stairs            Wheelchair Mobility    Modified Rankin (Stroke Patients Only)       Balance Overall balance assessment: Modified Independent   Sitting balance-Leahy Scale: Good       Standing balance-Leahy Scale: Good                      Cognition Arousal/Alertness: Awake/alert Behavior During Therapy: WFL for tasks assessed/performed Overall Cognitive Status: Within Functional Limits for tasks assessed                      Exercises      General Comments        Pertinent Vitals/Pain Pain Assessment: No/denies pain    Home Living                      Prior Function            PT Goals (current goals can now be found in the care plan section) Progress towards PT goals: Progressing toward goals    Frequency    Min 2X/week      PT Plan Discharge plan needs to be updated    Co-evaluation             End of Session Equipment Utilized During Treatment: Gait belt Activity Tolerance: Patient  tolerated treatment well Patient left: with chair alarm set;with call bell/phone within reach     Time: 1019-1034 PT Time Calculation (min) (ACUTE ONLY): 15 min  Charges:  $Gait Training: 8-22 mins                    G Codes:      Kreg Shropshire, DPT 09/12/2016, 11:58 AM

## 2016-09-12 NOTE — Clinical Social Work Note (Signed)
Clinical Social Work Assessment  Patient Details  Name: Tammie Mcmahon MRN: [id number] Date of Birth: 16-Dec-1927  Date of referral:  09/12/16               Reason for consult:  Facility Placement                Permission sought to share information with:  Family Supports Permission granted to share information::  Yes, Verbal Permission Granted  Name::        Agency::     Relationship::     Contact Information:     Housing/Transportation Living arrangements for the past 2 months:  [place] of Information:   (neice: Tammie [id number] [phone]) Patient Interpreter Needed:  None Criminal Activity/Legal Involvement Pertinent to Current Situation/Hospitalization:  No - Comment as needed Significant Relationships:  Other(Comment) (niece) Lives with:  Self Do you feel safe going back to the place where you live?  Yes Need for family participation in patient care:  Yes (Comment)  Care giving concerns:  Patient is independent with ADL's at baseline but has been having trouble with medications.   Social Worker assessment / plan:  CSW discovered PT recommendation for STR yesterday and went to speak with patient. CSW went to see patient and patient's niece was in the room as well as PT. PT informed CSW that they are now going to recommend home health but niece had some concerns. Niece then informed CSW that she is concerned about patient taking her medications appropriately. CSW recommended to initially try a pill box and sort out patient's medications for her. Niece stated that patient is not confused at baseline. She states that she is willing to try patient in the home setting and is agreeable to home health.   Employment status:  Retired Nurse, adult PT Recommendations:  Home with [place] / Referral to community resources:     Patient/Family's Response to care:  Patient's niece expressed appreciation for CSW  visit.  Patient/Family's Understanding of and Emotional Response to Diagnosis, Current Treatment, and Prognosis:  Patient's niece expressed some hesitation with patient returning home due to possible medication misuse.   Emotional Assessment Appearance:  Appears stated age Attitude/Demeanor/Rapport:   (pleasant) Affect (typically observed):  Calm, Quiet Orientation:  Oriented to Self, Oriented to  Time, Oriented to Situation, Fluctuating Orientation (Suspected and/or reported Sundowners) Alcohol / Substance use:  Not Applicable Psych involvement (Current and /or in the community):  No (Comment)  Discharge Needs  Concerns to be addressed:  Care Coordination Readmission within the last 30 days:    Current discharge risk:  None Barriers to Discharge:  No Barriers Identified   Shela Leff, LCSW 09/12/2016, 10:56 AM

## 2016-09-13 DIAGNOSIS — G9341 Metabolic encephalopathy: Secondary | ICD-10-CM | POA: Diagnosis not present

## 2016-09-13 LAB — CBC
HCT: 36.7 % (ref 35.0–47.0)
HEMOGLOBIN: 12.5 g/dL (ref 12.0–16.0)
MCH: 30.4 pg (ref 26.0–34.0)
MCHC: 34.1 g/dL (ref 32.0–36.0)
MCV: 89.2 fL (ref 80.0–100.0)
Platelets: 276 10*3/uL (ref 150–440)
RBC: 4.12 MIL/uL (ref 3.80–5.20)
RDW: 15.1 % — ABNORMAL HIGH (ref 11.5–14.5)
WBC: 7.4 10*3/uL (ref 3.6–11.0)

## 2016-09-13 LAB — PROTIME-INR
INR: 1.9
PROTHROMBIN TIME: 22.1 s — AB (ref 11.4–15.2)

## 2016-09-13 LAB — TSH: TSH: 0.368 u[IU]/mL (ref 0.350–4.500)

## 2016-09-13 LAB — T4, FREE: Free T4: 0.98 ng/dL (ref 0.61–1.12)

## 2016-09-13 NOTE — Plan of Care (Signed)
Problem: Safety: Goal: Ability to remain free from injury will improve Outcome: Not Progressing Pt is very restless.

## 2016-09-13 NOTE — Progress Notes (Signed)
[place] at [place] NAME: Tammie Mcmahon    MR#:  [id number]  DATE OF BIRTH:  1928-01-24  Confused, didn't sleep last night. Patient thinks  She is at home. She received 1 dose of Synthyroid yesterday, today. But did not get any doses before that. confirmed with patient's pharmacy. His continued further doses, check TSH again.        Review of Systems  Unable to perform ROS: Acuity of condition  Constitutional: Negative for chills and fever.  HENT: Negative for hearing loss.   Eyes: Negative for blurred vision, double vision and photophobia.  Respiratory: Negative for cough, hemoptysis and shortness of breath.   Cardiovascular: Negative for palpitations, orthopnea and leg swelling.  Gastrointestinal: Negative for abdominal pain, diarrhea and vomiting.  Genitourinary: Negative for dysuria and urgency.  Musculoskeletal: Negative for myalgias and neck pain.  Skin: Negative for rash.  Neurological: Negative for dizziness, focal weakness, seizures, weakness and headaches.  Psychiatric/Behavioral: Negative for memory loss. The patient does not have insomnia.    Tolerating Diet:yes Tolerating PT: pending  DRUG ALLERGIES:  No Known Allergies  VITALS:  Blood pressure (!) 149/75, pulse 76, temperature 98.4 F (36.9 C), temperature source Oral, resp. rate 20, height 5\' 6"  (1.676 m), weight 76.2 kg (168 lb 1.6 oz), SpO2 95 %.  PHYSICAL EXAMINATION:   Physical Exam  GENERAL:  82 y.o.-year-old patient lying in the bed with no acute distress. obese EYES: Pupils equal, round, reactive to light and accommodation. No scleral icterus. Extraocular muscles intact.  HEENT: Head atraumatic, normocephalic. Oropharynx and nasopharynx clear.  NECK:  Supple, no jugular venous distention. No thyroid enlargement, no tenderness.  LUNGS: Normal breath sounds bilaterally, no wheezing, rales, rhonchi. No use of accessory muscles of respiration.   CARDIOVASCULAR: S1, S2 normal. No murmurs, rubs, or gallops.  ABDOMEN: Soft, nontender, nondistended. Bowel sounds present. No organomegaly or mass.  EXTREMITIES: No cyanosis, clubbing or edema b/l.    NEUROLOGIC:  No focal Motor or sensory deficits b/l.   PSYCHIATRIC:  Waxing and wanningSKIN: No obvious rash, lesion, or ulcer.   LABORATORY PANEL:  CBC  Recent Labs Lab 09/13/16 0626  WBC 7.4  HGB 12.5  HCT 36.7  PLT 276    Chemistries   Recent Labs Lab 09/09/16 2300  NA 140  K 3.3*  CL 102  CO2 25  GLUCOSE 133*  BUN 16  CREATININE 1.06*  CALCIUM 8.9  AST 39  ALT 17  ALKPHOS 59  BILITOT 0.9   Cardiac Enzymes No results for input(s): TROPONINI in the last 168 hours. RADIOLOGY:  No results found. ASSESSMENT AND PLAN:   82 year old female admitted for confusion. 1. Acute delirium likely due to possible UTI: And hyperthyroidism.: A Check the urine, stop Synthyroid. synthyroid  stopped on admission, restarted by another M.D. on 7 but patient did not get any doses on seventh, patient got 2 doses  On 12/8,12/9.stopped further doses. Check TSH again. I don't think her delirium is due to getting 2 doses only. Check the urine again.  2. Atrial fibrillation: Rate controlled after metoprolol dosing. We'll continue metoprolol per home regimen. have restarted diltiazem 60 mg every 8 hours. Continue warfarin. Tachycardic, has elevated blood pressure,; adjust Cardizem.   3. H/o Heart failure: The patient preserved ejection fraction with only minimal atrial enlargement per her last echo.  She has no signs or symptoms of florid heart failure at this time.   - Continue with torsemide per  home regimen.  4. Hyperlipidemia: Continue statin therapy  5. Neuropathy: Hold the Cymbalta due to acute encephalopathy. 6. DT prophylaxis: Full dose anticoagulation as above Likely discharged tomorrow. D/w niece in the room  Case discussed with Care Management/Social Worker. Management  plans discussed with the patient, family and they are in agreement.  CODE STATUS:Full  DVT Prophylaxis: coumadin  TOTAL TIME TAKING CARE OF THIS PATIENT: 30 minutes.  >50% time spent on counselling and coordination of care  POSSIBLE D/C IN 1-2 DAYS, DEPENDING ON CLINICAL CONDITION.  Note: This dictation was prepared with Dragon dictation along with smaller phrase technology. Any transcriptional errors that result from this process are unintentional.  Sebastain Fishbaugh M.D on 09/13/2016 at 12:20 PM  Between 7am to 6pm - Pager - [phone]  After 6pm go to [email or web address] - password [email or web address] [facility]  Office  [phone]  CC: Primary care physician; Kirk Ruths., MD

## 2016-09-14 DIAGNOSIS — G9341 Metabolic encephalopathy: Secondary | ICD-10-CM | POA: Diagnosis not present

## 2016-09-14 LAB — PROTIME-INR
INR: 2.31
Prothrombin Time: 25.8 seconds — ABNORMAL HIGH (ref 11.4–15.2)

## 2016-09-14 MED ORDER — HALOPERIDOL 0.5 MG PO TABS
1.0000 mg | ORAL_TABLET | Freq: Once | ORAL | Status: AC
  Administered 2016-09-14: 1 mg via ORAL
  Filled 2016-09-14: qty 2

## 2016-09-14 NOTE — Progress Notes (Signed)
[facility] [place] NAME: Tammie Mcmahon    MR#:  [id number]  DATE OF BIRTH:  07/26/1928  A shunt is more alert, awake, oriented. Family is visiting. Tammie Mcmahon is, able to remember some of the medications.  Patient is much more awake, alert, oriented. Denies any complaints, walking around the nurse's station, family is visiting.    Review of Systems  Constitutional: Negative for chills and fever.  HENT: Negative for hearing loss.   Eyes: Negative for blurred vision, double vision and photophobia.  Respiratory: Negative for cough, hemoptysis and shortness of breath.   Cardiovascular: Negative for palpitations, orthopnea and leg swelling.  Gastrointestinal: Negative for abdominal pain, diarrhea and vomiting.  Genitourinary: Negative for dysuria and urgency.  Musculoskeletal: Negative for myalgias and neck pain.  Skin: Negative for rash.  Neurological: Negative for dizziness, focal weakness, seizures, weakness and headaches.  Psychiatric/Behavioral: Negative for memory loss. The patient does not have insomnia.    Tolerating Diet:yes Tolerating PT: pending  DRUG ALLERGIES:  No Known Allergies  VITALS:  Blood pressure (!) 166/106, pulse (!) 109, temperature 98.5 F (36.9 C), temperature source Oral, resp. rate 16, height 5\' 6"  (1.676 m), weight 77.3 kg (170 lb 8 oz), SpO2 99 %.  PHYSICAL EXAMINATION:   Physical Exam  GENERAL:  82 y.o.-year-old patient lying in the bed with no acute distress. obese EYES: Pupils equal, round, reactive to light and accommodation. No scleral icterus. Extraocular muscles intact.  HEENT: Head atraumatic, normocephalic. Oropharynx and nasopharynx clear.  NECK:  Supple, no jugular venous distention. No thyroid enlargement, no tenderness.  LUNGS: Normal breath sounds bilaterally, no wheezing, rales, rhonchi. No use of accessory muscles of respiration.  CARDIOVASCULAR: S1, S2 normal. No murmurs, rubs, or  gallops.  ABDOMEN: Soft, nontender, nondistended. Bowel sounds present. No organomegaly or mass.  EXTREMITIES: No cyanosis, clubbing or edema b/l.    NEUROLOGIC:  No focal Motor or sensory deficits b/l.   PSYCHIATRIC:  Waxing and wanningSKIN: No obvious rash, lesion, or ulcer.   LABORATORY PANEL:  CBC  Recent Labs Lab 09/13/16 0626  WBC 7.4  HGB 12.5  HCT 36.7  PLT 276    Chemistries   Recent Labs Lab 09/09/16 2300  NA 140  K 3.3*  CL 102  CO2 25  GLUCOSE 133*  BUN 16  CREATININE 1.06*  CALCIUM 8.9  AST 39  ALT 17  ALKPHOS 59  BILITOT 0.9   Cardiac Enzymes No results for input(s): TROPONINI in the last 168 hours. RADIOLOGY:  No results found. ASSESSMENT AND PLAN:   82 year old female admitted for confusion. 1. Acute delirium likely due to possible UTI: And hyperthyroidism.: A  uA looks fine. Stop the Synthyroid. Needs fine-tuning of medicine as an outpatient. synthyroid  stopped on admission, restarted by another M.D. on 7 but patient did not get any doses on seventh, patient got 2 doses  On 12/8,12/9.stopped further doses. Check TSH again. I don't think her delirium is due to getting 2 doses only.  .  2. Atrial fibrillation: Rate controlled after metoprolol dosing. We'll continue metoprolol per home regimen. have restarted diltiazem 60 mg every 8 hours. Continue warfarin.  Tachycardic, has elevated blood pressure, improved today.  3. H/o Heart failure: The patient preserved ejection fraction with only minimal atrial enlargement per her last echo.  She has no signs or symptoms of florid heart failure at this time.   - Continue with torsemide per home regimen.  4. Hyperlipidemia:  Continue statin therapy  5. Neuropathy: Started Cymbalta back.  6. DT prophylaxis: Full dose anticoagulation as above Likely discharged tomorrow D/w niece in the room  Case discussed with Care Management/Social Worker. Management plans discussed with the patient, family and  they are in agreement.  CODE STATUS:Full  DVT Prophylaxis: coumadin  TOTAL TIME TAKING CARE OF THIS PATIENT: 30 minutes.  >50% time spent on counselling and coordination of care  POSSIBLE D/C IN 1-2 DAYS, DEPENDING ON CLINICAL CONDITION.  Note: This dictation was prepared with Dragon dictation along with smaller phrase technology. Any transcriptional errors that result from this process are unintentional.  Tammie Mcmahon M.D on 09/14/2016 at 1:20 PM  Between 7am to 6pm - Pager - [phone]  After 6pm go to [email or web address] - password [email or web address] [name]  Office  [phone]  CC: Primary care physician; Tammie Mcmahon., MD

## 2016-09-15 DIAGNOSIS — G9341 Metabolic encephalopathy: Secondary | ICD-10-CM | POA: Diagnosis not present

## 2016-09-15 LAB — CBC
HCT: 37.1 % (ref 35.0–47.0)
HEMOGLOBIN: 12.4 g/dL (ref 12.0–16.0)
MCH: 30.2 pg (ref 26.0–34.0)
MCHC: 33.5 g/dL (ref 32.0–36.0)
MCV: 90.2 fL (ref 80.0–100.0)
PLATELETS: 286 10*3/uL (ref 150–440)
RBC: 4.11 MIL/uL (ref 3.80–5.20)
RDW: 15.4 % — ABNORMAL HIGH (ref 11.5–14.5)
WBC: 8.8 10*3/uL (ref 3.6–11.0)

## 2016-09-15 LAB — PROTIME-INR
INR: 2.45
PROTHROMBIN TIME: 27 s — AB (ref 11.4–15.2)

## 2016-09-15 MED ORDER — METOPROLOL SUCCINATE ER 50 MG PO TB24: 50.0000 mg | ORAL_TABLET | Freq: Every day | ORAL | 0 refills | Status: DC

## 2016-09-15 MED ORDER — LEVOTHYROXINE SODIUM 50 MCG PO TABS: 50.0000 ug | ORAL_TABLET | Freq: Every day | ORAL | 0 refills | Status: DC

## 2016-09-15 NOTE — Care Management (Signed)
Plan for patient to discharge today.  Orders have been written for home health PT and Tammie Mcmahon with [facility] notified of discharge.  Patient aware of discharge plan.  Niece to provide transportation at discharge. Will update niece when she arrives. RNCM signing off

## 2016-09-15 NOTE — Progress Notes (Signed)
Pt alert with sitter in room. Pt complains of belly ache but recovering overall. CH is available.   09/15/16 1140  Clinical Encounter Type  Visited With Patient;Health care provider  Visit Type Initial  Referral From Nurse  Spiritual Encounters  Spiritual Needs Emotional  Stress Factors  Patient Stress Factors Health changes

## 2016-09-15 NOTE — Progress Notes (Signed)
[facility] [place] NAME: Tammie Mcmahon    MR#:  [id number]  DATE OF BIRTH:  10-Nov-1927  A shunt is more alert, awake, oriented. Family is visiting. Tammie Mcmahon is, able to remember some of the medications.  Stable for discharge .    Review of Systems  Constitutional: Negative for chills and fever.  HENT: Negative for hearing loss.   Eyes: Negative for blurred vision, double vision and photophobia.  Respiratory: Negative for cough, hemoptysis and shortness of breath.   Cardiovascular: Negative for palpitations, orthopnea and leg swelling.  Gastrointestinal: Negative for abdominal pain, diarrhea and vomiting.  Genitourinary: Negative for dysuria and urgency.  Musculoskeletal: Negative for myalgias and neck pain.  Skin: Negative for rash.  Neurological: Negative for dizziness, focal weakness, seizures, weakness and headaches.  Psychiatric/Behavioral: Negative for memory loss. The patient does not have insomnia.    Tolerating Diet:yes Tolerating PT: pending  DRUG ALLERGIES:  No Known Allergies  VITALS:  Blood pressure (!) 146/64, pulse (!) 55, temperature 98.2 F (36.8 C), temperature source Oral, resp. rate 20, height 5\' 6"  (1.676 m), weight 77.3 kg (170 lb 8 oz), SpO2 95 %.  PHYSICAL EXAMINATION:   Physical Exam  GENERAL:  82 y.o.-year-old patient lying in the bed with no acute distress. obese EYES: Pupils equal, round, reactive to light and accommodation. No scleral icterus. Extraocular muscles intact.  HEENT: Head atraumatic, normocephalic. Oropharynx and nasopharynx clear.  NECK:  Supple, no jugular venous distention. No thyroid enlargement, no tenderness.  LUNGS: Normal breath sounds bilaterally, no wheezing, rales, rhonchi. No use of accessory muscles of respiration.  CARDIOVASCULAR: S1, S2 normal. No murmurs, rubs, or gallops.  ABDOMEN: Soft, nontender, nondistended. Bowel sounds present. No organomegaly or mass.   EXTREMITIES: No cyanosis, clubbing or edema b/l.    NEUROLOGIC:  No focal Motor or sensory deficits b/l.   PSYCHIATRIC:  Waxing and wanningSKIN: No obvious rash, lesion, or ulcer.   LABORATORY PANEL:  CBC  Recent Labs Lab 09/15/16 0551  WBC 8.8  HGB 12.4  HCT 37.1  PLT 286    Chemistries   Recent Labs Lab 09/09/16 2300  NA 140  K 3.3*  CL 102  CO2 25  GLUCOSE 133*  BUN 16  CREATININE 1.06*  CALCIUM 8.9  AST 39  ALT 17  ALKPHOS 59  BILITOT 0.9   Cardiac Enzymes No results for input(s): TROPONINI in the last 168 hours. RADIOLOGY:  No results found. ASSESSMENT AND PLAN:   82 year old female admitted for confusion. 1. Acute delirium likely due to possible UTI: And hyperthyroidism.: improved.no uti.tsh imrovinmg.decreased dose of synthyroid. Discharge home today.  uA looks fine.   synthyroid  stopped on admission, .  2. Atrial fibrillation: Rate controlled after metoprolol dosing. We'll continue metoprolol per home regimen. have restarted diltiazem 60 mg every 8 hours. Continue warfarin.  Tachycardic, has elevated blood pressure, improved today.  3. H/o Heart failure: The patient preserved ejection fraction with only minimal atrial enlargement per her last echo.  She has no signs or symptoms of florid heart failure at this time.   - Continue with torsemide per home regimen.  4. Hyperlipidemia: Continue statin therapy  5. Neuropathy: Started Cymbalta back.  6. DT prophylaxis: Full dose anticoagulation as above D/c home today.  D/w niece in the room  Case discussed with Care Management/Social Worker. Management plans discussed with the patient, family and they are in agreement.  CODE STATUS:Full  DVT Prophylaxis: coumadin  TOTAL TIME TAKING CARE OF THIS PATIENT: 30 minutes.  >50% time spent on counselling and coordination of care  Note: This dictation was prepared with Dragon dictation along with smaller phrase technology. Any transcriptional  errors that result from this process are unintentional.  Tammie Mcmahon M.D on 09/15/2016 at 10:28 PM  Between 7am to 6pm - Pager - [phone]  After 6pm go to [email or web address] - password [email or web address] [facility]  Office  [phone]  CC: Primary care physician; Tammie Mcmahon., MD

## 2016-09-15 NOTE — Progress Notes (Signed)
IV was removed. Discharge instructions, follow-up appointments, and prescriptions were provided to the pt and niece at bedside. All questions were answered. The pt was taken downstairs via wheelchair by [employer].

## 2016-09-15 NOTE — Progress Notes (Signed)
Physical Therapy Treatment Patient Details Name: Tammie Mcmahon MRN: [id number] DOB: 03/22/1928 Today's Date: 09/15/2016    History of Present Illness 82 y/o female with past medical history of atrial fibrillation and diabetes as well as hypothyroidism presented to ED with mental status changes. The patient's niece states that the patient became progressively more confused, she is here under observation status.  She remained confused during PT exam.    PT Comments    Pt demonstrates continued improvement in mobility. She does have some mild staggering with gait which appears to be consistent with prior sessions. However she is able to self correct for all staggers and does much better with use of rolling walker. Pt encouraged to utilize rolling walker at discharge for additional safety. She would benefit from [facility] PT at discharge to work on strength, endurance, and balance. Pt will benefit from skilled PT services to address deficits in strength, balance, and mobility in order to return to full function at home.    Follow Up Recommendations  Home health PT     Equipment Recommendations  None recommended by PT;Other (comment) (encouraged pt to utilize RW at discharge)    Recommendations for Other Services       Precautions / Restrictions Precautions Precautions: Fall Restrictions Weight Bearing Restrictions: No    Mobility  Bed Mobility               General bed mobility comments: Received upright in recliner and left in recliner per patient request. Not assessed on this date  Transfers Overall transfer level: Modified independent Equipment used: None Transfers: Sit to/from Stand Sit to Stand: Modified independent (Device/Increase time)         General transfer comment: Pt already standing up beside recliner upon arrival. Performed sit to stand transfers with PT with good stability noted. Requires cues for safe hand placement  Ambulation/Gait Ambulation/Gait  assistance: Min guard   Assistive device: Rolling walker (2 wheeled) Gait Pattern/deviations: Decreased step length - right;Decreased step length - left Gait velocity: Decreased but functional for limited community mobility Gait velocity interpretation: <1.8 ft/sec, indicative of risk for recurrent falls General Gait Details: Pt ambulates with decreased step length. Initial ambulation performed without UE support and pt demonstrates some mild lateral staggering for which she is able to self correct. Utilized rolling walker for additional ambulation and pt demonstrates good stability. VSS throughout ambulatio and pt denies DOE. Cues required to walk inside confines of walker and proper hand placement on grips of walker. Intermittent rest breaks with vitals   Stairs            Wheelchair Mobility    Modified Rankin (Stroke Patients Only)       Balance Overall balance assessment: Modified Independent   Sitting balance-Leahy Scale: Good       Standing balance-Leahy Scale: Good Standing balance comment: Even with walker pt had multilple small stagger steps and moments where awareness/safety were lacking and she needed direct assist to remain upright                    Cognition Arousal/Alertness: Awake/alert Behavior During Therapy: WFL for tasks assessed/performed Overall Cognitive Status: Within Functional Limits for tasks assessed                      Exercises      General Comments        Pertinent Vitals/Pain Pain Assessment: No/denies pain    Home Living  Prior Function            PT Goals (current goals can now be found in the care plan section) Acute Rehab PT Goals Patient Stated Goal: unable to state PT Goal Formulation: With patient Time For Goal Achievement: 09/25/16 Potential to Achieve Goals: Fair Progress towards PT goals: Progressing toward goals    Frequency    Min 2X/week      PT Plan  Current plan remains appropriate    Co-evaluation             End of Session Equipment Utilized During Treatment: Gait belt Activity Tolerance: Patient tolerated treatment well Patient left: with chair alarm set;with call bell/phone within reach;with nursing/sitter in room;in chair     Time: [id number] PT Time Calculation (min) (ACUTE ONLY): 10 min  Charges:  $Gait Training: 8-22 mins                    G Codes:      Lyndel Safe Cordai Rodrigue PT, DPT   Noma Quijas 09/15/2016, 12:24 PM

## 2016-09-17 NOTE — Discharge Summary (Signed)
Tammie Mcmahon, is a 82 y.o. female  DOB 1927/10/19  MRN [id number].  Admission date:  09/09/2016  Admitting Physician  Harrie Foreman, MD  Discharge Date:  09/16/2016   Primary MD  Kirk Ruths., MD  Recommendations for primary care physician for things to follow:   Follow-up PRIMARY doctor in 1 week   Admission Diagnosis  Dehydration [E86.0] Confusion [R41.0] Atrial fibrillation with RVR (HCC) [I48.91]   Discharge Diagnosis  Dehydration [E86.0] Confusion [R41.0] Atrial fibrillation with RVR ([place]) [I48.91]    Active Problems:   Confusion      Past Medical History:  Diagnosis Date  . (HFpEF) heart failure with preserved ejection fraction ([place])   . Atrial fibrillation and flutter ([place])   . Breast cancer ([place])   . CKD (chronic kidney disease), stage III   . Diabetes mellitus without complication ([place])   . Hyperlipidemia   . Hypertension   . Low back pain   . Menopausal and postmenopausal disorder   . PA (pernicious anemia)   . Thyroid disease   . Valvular heart disease     Past Surgical History:  Procedure Laterality Date  . BREAST SURGERY    . GALLBLADDER SURGERY    . MASTECTOMY Left   . THYROIDECTOMY         History of present illness and  Hospital Course:     Kindly see H&P for history of present illness and admission details, please review complete Labs, Consult reports and Test reports for all details in brief  HPI  from the history and physical done on the day of admission 82 year old female patient with history of chronic atrial fibrillation, chronic kidney disease stage III, diabetes mellitus without complications, hyperlipidemia, hypertension came in because of altered mental status. Brought in by family because of progressive confusion, agitation. Admitted for this. Head  CT unremarkable on admission.   Hospital Course  #1 altered mental status secondary to combination of thyrotoxicosis: Patient T4 1 0.38, TSH 0.08 on admission. So thyroid medicines were held on admission, monitor closely. Patient did not have any evidence for infection, blood cultures, urine analysis within normal range without evidence of infection. Patient received IV hydration, since confusion resolved.Marland Kitchen Discharge her home with home physical therapy, nurse.  Free T4 improved, 0.98 on December 9, on . TSH 0.368. Because T4, TSH levels are normalized discharge her with the lower dose of Synthroid at 50 g daily, discussed this with patient's family.   #2. Chronic diastolic heart failure: No signs of fluid overload this time. #3 atrial fibrillation: Patient is on metoprolol, Cardizem. Continue warfarin at discharge. #4 hyperlipidemia continue statins at discharge. #5 history of neuropathy: Started back on Cymbalta.  Discharge Condition: stable   Follow UP  Follow-up Information    Kirk Ruths., MD. Schedule an appointment as soon as possible for a visit in 3 days.   Specialty:  Internal Medicine Why:  OFFICE WILL CONTCT YOU WITH APPT. Contact information: [facility] [address] [place] [address] [phone]             Discharge Instructions  and  Discharge Medications     Discharge Instructions    Face-to-face encounter (required for Medicare/Medicaid patients)    Complete by:  As directed    I [facility] certify that this patient is under my care and that I, or a nurse practitioner or physician's assistant working with me, had a face-to-face encounter that meets the physician face-to-face encounter  requirements with this patient on 09/15/2016. The encounter with the patient was in whole, or in part for the following medical condition Hypothyroid htn   The encounter with the patient was in whole, or in part, for the following  medical condition, which is the primary reason for home health care:  whole   I certify that, based on my findings, the following services are medically necessary home health services:   Nursing Physical therapy     Reason for Medically Necessary Home Health Services:  Therapy- Personnel officer, Public librarian   My clinical findings support the need for the above services:  Cognitive impairments, dementia, or mental confusion  that make it unsafe to leave home   Further, I certify that my clinical findings support that this patient is homebound due to:  Unable to leave home safely without assistance   Home Health    Complete by:  As directed    To provide the following care/treatments:   PT RN         Medication List    STOP taking these medications   azithromycin 250 MG tablet Commonly known as:  ZITHROMAX   cephALEXin 500 MG capsule Commonly known as:  KEFLEX     TAKE these medications   cholecalciferol 1000 units tablet Commonly known as:  VITAMIN D Take 1,000 Units by mouth daily.   diclofenac sodium 1 % Gel Commonly known as:  VOLTAREN Apply topically 4 (four) times daily.   DULoxetine 60 MG capsule Commonly known as:  CYMBALTA Take 60 mg by mouth daily. take 1 capsule by mouth once daily   fluticasone 50 MCG/ACT nasal spray Commonly known as:  FLONASE Place 1 spray into both nostrils daily as needed for allergies.   levothyroxine 50 MCG tablet Commonly known as:  SYNTHROID Take 1 tablet (50 mcg total) by mouth daily before breakfast. What changed:  medication strength  how much to take   metoprolol succinate 50 MG 24 hr tablet Commonly known as:  TOPROL XL Take 1 tablet (50 mg total) by mouth daily. Take with or immediately following a meal. What changed:  medication strength  how much to take   omeprazole 20 MG capsule Commonly known as:  PRILOSEC Take 20 mg by mouth daily.   potassium chloride 10 MEQ CR capsule Commonly known  as:  MICRO-K Take 10 mEq by mouth 2 (two) times daily. take 1 capsule by mouth twice a day   pravastatin 40 MG tablet Commonly known as:  PRAVACHOL take 1 tablet by mouth EVERY NIGHT   torsemide 20 MG tablet Commonly known as:  DEMADEX Take 40 mg by mouth daily.   warfarin 2 MG tablet Commonly known as:  COUMADIN Take 2 mg by mouth daily. Taking 3mg  daily: 2mg  tablet and 1mg  tablet per RN at Dr. Tonette Bihari office   warfarin 1 MG tablet Commonly known as:  COUMADIN Take 1 mg by mouth as directed. Taking 3mg  daily: 1mg  tablet and 2mg  tablet; per RN at Dr. Tonette Bihari office         Diet and Activity recommendation: See Discharge Instructions above   Consults obtained -Physical therapy   Major procedures and Radiology Reports - PLEASE review detailed and final reports for all details, in brief -     Ct Head Wo Contrast  Result Date: 09/10/2016 CLINICAL DATA:  Altered mental status, confusion. EXAM: CT HEAD WITHOUT CONTRAST TECHNIQUE: Contiguous axial images were obtained from the base of the skull through  the vertex without intravenous contrast. COMPARISON:  Head CT 05/19/2016 FINDINGS: Mild motion artifact despite repeated attempts and medication. Brain: No evidence of acute infarction, hemorrhage, hydrocephalus, extra-axial collection or mass lesion/mass effect. Stable atrophy and chronic small vessel ischemia. Again seen basal gangliar calcifications. Vascular: Atherosclerosis of skullbase vasculature. No hyperdense vessel. Skull: Normal. Negative for fracture or focal lesion. Sinuses/Orbits: Motion artifact limits assessment. No paranasal sinus inflammation. Other: None. IMPRESSION: No evidence of acute intracranial abnormality allowing for mild motion artifact. Stable atrophy and chronic small vessel ischemia. Electronically Signed   By: Jeb Levering M.D.   On: 09/10/2016 02:04   Mr Brain Wo Contrast  Result Date: 09/11/2016 CLINICAL DATA:  Altered mental status with  confusion. Atrial fibrillation. Diabetes. EXAM: MRI HEAD WITHOUT CONTRAST TECHNIQUE: Multiplanar, multiecho pulse sequences of the brain and surrounding structures were obtained without intravenous contrast. COMPARISON:  CT head 09/09/2016. FINDINGS: Significant motion degradation. The study is of marginal diagnostic utility. Brain: Advanced atrophy with chronic microvascular ischemic change. No restricted diffusion, visible hemorrhage, mass effect, or hydrocephalus. No extra-axial fluid collection is evident. Vascular: Grossly normal flow voids. Skull and upper cervical spine: Not assessed. Sinuses/Orbits: No visible layering fluid or orbital mass. Other: None. IMPRESSION: Atrophy and small vessel disease. No gross intracranial abnormality is detected. Electronically Signed   By: Staci Righter M.D.   On: 09/11/2016 10:53   Dg Chest Portable 1 View  Result Date: 09/09/2016 CLINICAL DATA:  Altered mental status. EXAM: PORTABLE CHEST 1 VIEW COMPARISON:  Frontal and lateral views 12/16/2015 FINDINGS: Lung volumes are low. Unchanged cardiomediastinal contours with borderline cardiomegaly. Atherosclerosis of the aortic arch. Mild right basilar atelectasis or scarring. Chronic interstitial coarsening. No evidence of pneumonia, pleural fluid or pneumothorax. Osseous structures are unchanged. IMPRESSION: 1. Low lung volumes with right basilar atelectasis. 2. Chronic interstitial coarsening. 3. Atherosclerosis of the thoracic aorta. Electronically Signed   By: Jeb Levering M.D.   On: 09/09/2016 23:36    Micro Results    No results found for this or any previous visit (from the past 240 hour(s)).     Today   Subjective:   Nahomi Carmack today has no headache,no chest abdominal pain,no new weakness tingling or numbness, feels much better wants to go home today.   Objective:   Blood pressure (!) 146/64, pulse (!) 55, temperature 98.2 F (36.8 C), temperature source Oral, resp. rate 20, height 5\' 6"   (1.676 m), weight 77.3 kg (170 lb 8 oz), SpO2 95 %.  No intake or output data in the 24 hours ending 09/17/16 1708  Exam Awake Alert, Oriented x 3, No new F.N deficits, Normal affect [place].AT,PERRAL Supple Neck,No JVD, No cervical lymphadenopathy appriciated.  Symmetrical Chest wall movement, Good air movement bilaterally, CTAB RRR,No Gallops,Rubs or new Murmurs, No Parasternal Heave +ve B.Sounds, Abd Soft, Non tender, No organomegaly appriciated, No rebound -guarding or rigidity. No Cyanosis, Clubbing or edema, No new Rash or bruise  Data Review   CBC w Diff:  Lab Results  Component Value Date   WBC 8.8 09/15/2016   HGB 12.4 09/15/2016   HGB 14.2 02/27/2013   HCT 37.1 09/15/2016   HCT 41.7 02/27/2013   PLT 286 09/15/2016   PLT 310 02/27/2013    CMP:  Lab Results  Component Value Date   NA 140 09/09/2016   NA 138 02/27/2013   K 3.3 (L) 09/09/2016   K 3.8 02/27/2013   CL 102 09/09/2016   CL 105 02/27/2013   CO2 25 09/09/2016  CO2 27 02/27/2013   BUN 16 09/09/2016   BUN 13 02/27/2013   CREATININE 1.06 (H) 09/09/2016   CREATININE 0.82 02/27/2013   PROT 7.3 09/09/2016   ALBUMIN 4.1 09/09/2016   BILITOT 0.9 09/09/2016   ALKPHOS 59 09/09/2016   AST 39 09/09/2016   ALT 17 09/09/2016  .   Total Time in preparing paper work, data evaluation and todays exam - 51 minutes  Criag Wicklund M.D on 09/16/2016 at 5:08 PM    Note: This dictation was prepared with Dragon dictation along with smaller phrase technology. Any transcriptional errors that result from this process are unintentional.

## 2016-10-06 ENCOUNTER — Encounter: Payer: Self-pay | Admitting: Emergency Medicine

## 2016-10-06 ENCOUNTER — Emergency Department: Payer: Medicare Other

## 2016-10-06 ENCOUNTER — Emergency Department
Admission: EM | Admit: 2016-10-06 | Discharge: 2016-10-07 | Disposition: A | Discharge status: Home / Self Care | Payer: Medicare Other | Attending: Emergency Medicine | Admitting: Emergency Medicine

## 2016-10-06 DIAGNOSIS — I6523 Occlusion and stenosis of bilateral carotid arteries: Secondary | ICD-10-CM | POA: Diagnosis not present

## 2016-10-06 DIAGNOSIS — I480 Paroxysmal atrial fibrillation: Secondary | ICD-10-CM | POA: Diagnosis not present

## 2016-10-06 DIAGNOSIS — Z79899 Other long term (current) drug therapy: Secondary | ICD-10-CM | POA: Diagnosis not present

## 2016-10-06 DIAGNOSIS — R05 Cough: Secondary | ICD-10-CM

## 2016-10-06 DIAGNOSIS — I509 Heart failure, unspecified: Secondary | ICD-10-CM | POA: Insufficient documentation

## 2016-10-06 DIAGNOSIS — I130 Hypertensive heart and chronic kidney disease with heart failure and stage 1 through stage 4 chronic kidney disease, or unspecified chronic kidney disease: Secondary | ICD-10-CM | POA: Diagnosis not present

## 2016-10-06 DIAGNOSIS — F17290 Nicotine dependence, other tobacco product, uncomplicated: Secondary | ICD-10-CM | POA: Diagnosis not present

## 2016-10-06 DIAGNOSIS — Z853 Personal history of malignant neoplasm of breast: Secondary | ICD-10-CM | POA: Insufficient documentation

## 2016-10-06 DIAGNOSIS — E1122 Type 2 diabetes mellitus with diabetic chronic kidney disease: Secondary | ICD-10-CM | POA: Diagnosis not present

## 2016-10-06 DIAGNOSIS — N183 Chronic kidney disease, stage 3 (moderate): Secondary | ICD-10-CM | POA: Diagnosis not present

## 2016-10-06 DIAGNOSIS — R7989 Other specified abnormal findings of blood chemistry: Secondary | ICD-10-CM | POA: Insufficient documentation

## 2016-10-06 DIAGNOSIS — R778 Other specified abnormalities of plasma proteins: Secondary | ICD-10-CM

## 2016-10-06 DIAGNOSIS — Z7901 Long term (current) use of anticoagulants: Secondary | ICD-10-CM | POA: Diagnosis not present

## 2016-10-06 DIAGNOSIS — R059 Cough, unspecified: Secondary | ICD-10-CM

## 2016-10-06 LAB — CBC WITH DIFFERENTIAL/PLATELET
Basophils Absolute: 0.1 10*3/uL (ref 0–0.1)
Basophils Relative: 1 %
EOS PCT: 1 %
Eosinophils Absolute: 0.1 10*3/uL (ref 0–0.7)
HEMATOCRIT: 41 % (ref 35.0–47.0)
Hemoglobin: 13.8 g/dL (ref 12.0–16.0)
LYMPHS ABS: 2.3 10*3/uL (ref 1.0–3.6)
LYMPHS PCT: 22 %
MCH: 29.7 pg (ref 26.0–34.0)
MCHC: 33.6 g/dL (ref 32.0–36.0)
MCV: 88.4 fL (ref 80.0–100.0)
MONO ABS: 0.7 10*3/uL (ref 0.2–0.9)
Monocytes Relative: 7 %
NEUTROS ABS: 7.3 10*3/uL — AB (ref 1.4–6.5)
Neutrophils Relative %: 69 %
PLATELETS: 336 10*3/uL (ref 150–440)
RBC: 4.64 MIL/uL (ref 3.80–5.20)
RDW: 15.2 % — AB (ref 11.5–14.5)
WBC: 10.4 10*3/uL (ref 3.6–11.0)

## 2016-10-06 LAB — COMPREHENSIVE METABOLIC PANEL
ALBUMIN: 4.3 g/dL (ref 3.5–5.0)
ALT: 17 U/L (ref 14–54)
ANION GAP: 10 (ref 5–15)
AST: 43 U/L — AB (ref 15–41)
Alkaline Phosphatase: 68 U/L (ref 38–126)
BUN: 17 mg/dL (ref 6–20)
CHLORIDE: 101 mmol/L (ref 101–111)
CO2: 25 mmol/L (ref 22–32)
Calcium: 9.2 mg/dL (ref 8.9–10.3)
Creatinine, Ser: 1.42 mg/dL — ABNORMAL HIGH (ref 0.44–1.00)
GFR calc Af Amer: 38 mL/min — ABNORMAL LOW (ref >60)
GFR calc non Af Amer: 32 mL/min — ABNORMAL LOW (ref >60)
GLUCOSE: 148 mg/dL — AB (ref 65–99)
POTASSIUM: 3.9 mmol/L (ref 3.5–5.1)
SODIUM: 136 mmol/L (ref 135–145)
Total Bilirubin: 0.7 mg/dL (ref 0.3–1.2)
Total Protein: 7.6 g/dL (ref 6.5–8.1)

## 2016-10-06 LAB — URINALYSIS, COMPLETE (UACMP) WITH MICROSCOPIC
BILIRUBIN URINE: NEGATIVE
GLUCOSE, UA: NEGATIVE mg/dL
Hgb urine dipstick: NEGATIVE
KETONES UR: NEGATIVE mg/dL
Leukocytes, UA: NEGATIVE
NITRITE: NEGATIVE
PH: 5 (ref 5.0–8.0)
Protein, ur: NEGATIVE mg/dL
SPECIFIC GRAVITY, URINE: 1.014 (ref 1.005–1.030)

## 2016-10-06 LAB — TROPONIN I: Troponin I: 0.03 ng/mL (ref <0.03)

## 2016-10-06 MED ORDER — SODIUM CHLORIDE 0.9 % IV BOLUS (SEPSIS)
500.0000 mL | Freq: Once | INTRAVENOUS | Status: AC
  Administered 2016-10-06: 500 mL via INTRAVENOUS

## 2016-10-06 MED ORDER — DILTIAZEM HCL 25 MG/5ML IV SOLN
10.0000 mg | Freq: Once | INTRAVENOUS | Status: AC
  Administered 2016-10-06: 10 mg via INTRAVENOUS

## 2016-10-06 MED ORDER — DILTIAZEM HCL 25 MG/5ML IV SOLN
INTRAVENOUS | Status: AC
  Administered 2016-10-06: 10 mg via INTRAVENOUS
  Filled 2016-10-06: qty 5

## 2016-10-06 NOTE — ED Notes (Signed)
Snuff removed from pt's mouth. Inner lower lip and tongue remain black from removed snuff.

## 2016-10-06 NOTE — ED Provider Notes (Signed)
[facility] Department Provider Note  ____________________________________________   I have reviewed the triage vital signs and the nursing notes.   HISTORY  Chief Complaint Atrial Fibrillation and Weakness    HPI Tammie Mcmahon is a 83 y.o. female who presents today post of different complaints. The first is that she feels somewhat weak generally and this is been going on all day long. She states she has been recently treated with a urinary tract infection with Cipro she still took a nap and her urine seems to be getting better subjectively. She also states that she has "congestion" from a cough and that has mostly improved as well. Her basic concern is she just feels generally weak. She states for example she is having trouble signing her name but not because she was focally weak just because she felt generally weak. She denies any headache or stiff neck. Patient is on Coumadin and has not had her Coumadin level checked since she started the Cipro. However she does not have any headache and she denies any rectal bleeding. She does have a history of atrial fibrillation in the past. This is why she is on Coumadin. She has been compliant with her medications.   Past Medical History:  Diagnosis Date  . (HFpEF) heart failure with preserved ejection fraction ([place])   . Atrial fibrillation and flutter ([place])   . Breast cancer ([place])   . CKD (chronic kidney disease), stage III   . Diabetes mellitus without complication ([place])   . Hyperlipidemia   . Hypertension   . Low back pain   . Menopausal and postmenopausal disorder   . PA (pernicious anemia)   . Thyroid disease   . Valvular heart disease     Patient Active Problem List   Diagnosis Date Noted  . Confusion 09/10/2016    Past Surgical History:  Procedure Laterality Date  . BREAST SURGERY    . GALLBLADDER SURGERY    . MASTECTOMY Left   . THYROIDECTOMY      Prior to Admission medications    Medication Sig Start Date End Date Taking? Authorizing Provider  benzonatate (TESSALON) 200 MG capsule Take 1 capsule by mouth 3 (three) times daily as needed. 09/30/16  Yes Historical Provider, MD  cholecalciferol (VITAMIN D) 1000 units tablet Take 1,000 Units by mouth daily.   Yes Historical Provider, MD  ciprofloxacin (CIPRO) 500 MG tablet Take 1 tablet by mouth 2 (two) times daily. For 7 days 10/02/16  Yes Historical Provider, MD  diclofenac sodium (VOLTAREN) 1 % GEL Apply topically 4 (four) times daily.   Yes Historical Provider, MD  DULoxetine (CYMBALTA) 60 MG capsule Take 60 mg by mouth daily. take 1 capsule by mouth once daily   Yes Historical Provider, MD  fluticasone (FLONASE) 50 MCG/ACT nasal spray Place 1 spray into both nostrils daily as needed for allergies.    Yes Historical Provider, MD  levothyroxine (SYNTHROID) 50 MCG tablet Take 1 tablet (50 mcg total) by mouth daily before breakfast. 09/15/16  Yes Epifanio Lesches, MD  metoprolol succinate (TOPROL-XL) 100 MG 24 hr tablet Take 100 mg by mouth daily. 09/18/16  Yes Historical Provider, MD  omeprazole (PRILOSEC) 20 MG capsule Take 20 mg by mouth daily.   Yes Historical Provider, MD  potassium chloride (MICRO-K) 10 MEQ CR capsule Take 10 mEq by mouth 2 (two) times daily. take 1 capsule by mouth twice a day 04/10/14  Yes Historical Provider, MD  pravastatin (PRAVACHOL) 40 MG tablet take  1 tablet by mouth EVERY NIGHT 05/19/14  Yes Historical Provider, MD  torsemide (DEMADEX) 20 MG tablet Take 40 mg by mouth daily.   Yes Historical Provider, MD  warfarin (COUMADIN) 1 MG tablet Take 1 mg by mouth as directed. Taking 3mg  daily: 1mg  tablet and 2mg  tablet; per RN at Dr. Tonette Bihari office   Yes Historical Provider, MD  warfarin (COUMADIN) 2 MG tablet Take 2 mg by mouth daily. Taking 3mg  daily: 2mg  tablet and 1mg  tablet per RN at Dr. Tonette Bihari office   Yes Historical Provider, MD  PNEUMOVAX 23 25 MCG/0.5ML injection Inject 1 Dose into the  muscle once. 09/18/16   Historical Provider, MD    Allergies Patient has no known allergies.  Family History  Problem Relation Age of Onset  . Heart attack Father     Social History Social History  Substance Use Topics  . Smoking status: Never Smoker  . Smokeless tobacco: Current User    Types: Snuff  . Alcohol use No    Review of Systems Constitutional: No fever/chills Eyes: No visual changes. ENT: No sore throat. No stiff neck no neck pain Cardiovascular: Denies chest pain. Respiratory: Denies shortness of breath. Gastrointestinal:   no vomiting.  No diarrhea.  No constipation. Genitourinary: Negative for dysuria. Musculoskeletal: Negative lower extremity swelling Skin: Negative for rash. Neurological: Negative for severe headaches, focal weakness or numbness. 10-point ROS otherwise negative.  ____________________________________________   PHYSICAL EXAM:  VITAL SIGNS: ED Triage Vitals  Enc Vitals Group     BP 10/06/16 2136 116/71     Pulse Rate 10/06/16 2120 (!) 30     Resp 10/06/16 2120 12     Temp 10/06/16 2120 97.7 F (36.5 C)     Temp Source 10/06/16 2120 Axillary     SpO2 10/06/16 2120 98 %     Weight 10/06/16 2120 170 lb (77.1 kg)     Height 10/06/16 2120 5\' 6"  (1.676 m)     Head Circumference --      Peak Flow --      Pain Score 10/06/16 2120 6     Pain Loc --      Pain Edu? --      Excl. in [place]? --     Constitutional: Alert and oriented. Well appearing and in no acute distress. Eyes: Conjunctivae are normal. PERRL. EOMI. Head: Atraumatic. Nose: No congestion/rhinnorhea. Mouth/Throat: Mucous membranes are moist.  Oropharynx non-erythematous. Neck: No stridor.   Nontender with no meningismus Cardiovascular:Tachycardia, irregularly irregular no murmurs or rubs Respiratory: Normal respiratory effort.  No retractions. Lungs CTAB. Abdominal: Soft and nontender. No distention. No guarding no rebound Back:  There is no focal tenderness or step off.   there is no midline tenderness there are no lesions noted. there is no CVA tenderness Musculoskeletal: No lower extremity tenderness, no upper extremity tenderness. No joint effusions, no DVT signs strong distal pulses no edema Neurologic:  Normal speech and language. No gross focal neurologic deficits are appreciated.  Skin:  Skin is warm, dry and intact. No rash noted. Psychiatric: Mood and affect are normal. Speech and behavior are normal.  ____________________________________________   LABS (all labs ordered are listed, but only abnormal results are displayed)  Labs Reviewed  COMPREHENSIVE METABOLIC PANEL - Abnormal; Notable for the following:       Result Value   Glucose, Bld 148 (*)    Creatinine, Ser 1.42 (*)    AST 43 (*)    GFR calc non Af Amer 32 (*)  GFR calc Af Amer 38 (*)    All other components within normal limits  CBC WITH DIFFERENTIAL/PLATELET - Abnormal; Notable for the following:    RDW 15.2 (*)    Neutro Abs 7.3 (*)    All other components within normal limits  TROPONIN I - Abnormal; Notable for the following:    Troponin I 0.03 (*)    All other components within normal limits  TROPONIN I  PROTIME-INR  APTT  URINALYSIS, COMPLETE (UACMP) WITH MICROSCOPIC  TSH   ____________________________________________  EKG  I personally interpreted any EKGs ordered by me or triage 2 EKGs were obtained on this patient, the first EKG shows atrial fibrillation with rapid ventricular response rate of 172 with likely rate related changes especially in the lateral leads, Second EKG shows sinus rhythm, this was obtained after Cardizem, rate 89 bpm, no acute ST elevation, borderline strain pattern in the left, and normal axis. ____________________________________________  [id number]  I reviewed any imaging ordered by me or triage that were performed during my shift and, if possible, patient and/or family made aware of any abnormal  findings. ____________________________________________   PROCEDURES  Procedure(s) performed: None  Procedures  Critical Care performed: None  ____________________________________________   INITIAL IMPRESSION / ASSESSMENT AND PLAN / ED COURSE  Pertinent labs & imaging results that were available during my care of the patient were reviewed by me and considered in my medical decision making (see chart for details).  Age with a host of different complaints today however I think most likely is attributed we'll to her A. fib with rapid ventricular response. Heart rate was in the 170s when she arrived. We did give her a little bit of IV fluid as she appears somewhat dehydrated and we gave her 10 L of Cardizem it appears that she has converted back to sinus rhythm. She feels somewhat better. Patient has been admitted recently for thyroid issues we'll recheck her thyroid, we'll recheck a urine, we'll check Coumadin as she is on Cipro and we will reassess. Signed out to dr. Karma Greaser at the end of my shift.  Clinical Course    ____________________________________________   FINAL CLINICAL IMPRESSION(S) / ED DIAGNOSES  Final diagnoses:  Cough      This chart was dictated using voice recognition software.  Despite best efforts to proofread,  errors can occur which can change meaning.      Schuyler Amor, MD 10/06/16 [phone]

## 2016-10-06 NOTE — ED Notes (Signed)
Patient transported to CT 

## 2016-10-06 NOTE — ED Triage Notes (Signed)
Pt arrived via ems from home with complains of weakness. Pt reports "feeling bad" since "around 1600 or 1700 today." Pt has a HX of a fib. EMS reports a fib and vitals as followed; BP 130, HR 160-170, 97% on room air.

## 2016-10-07 DIAGNOSIS — I480 Paroxysmal atrial fibrillation: Secondary | ICD-10-CM | POA: Diagnosis not present

## 2016-10-07 LAB — PROTIME-INR
INR: 2.03
PROTHROMBIN TIME: 23.3 s — AB (ref 11.4–15.2)

## 2016-10-07 LAB — APTT: aPTT: 38 seconds — ABNORMAL HIGH (ref 24–36)

## 2016-10-07 LAB — TROPONIN I: TROPONIN I: 0.06 ng/mL — AB (ref <0.03)

## 2016-10-07 LAB — TSH: TSH: 13.509 u[IU]/mL — ABNORMAL HIGH (ref 0.350–4.500)

## 2016-10-07 NOTE — Discharge Instructions (Signed)
Your episode of rapid and irregular heart rate was treated with a dose of medication which improved both the irregularity and the speed.  It gave you quite a stress test and as a result your troponin was slightly elevated, but since your symptoms completely resolved and you have felt fine over the last few hours we all agreed he would be more comfortable at home and coming into the hospital.  However if you develop any new or worse symptoms, please return immediately to the emergency department or call 911.  Follow up with your regular doctor at the next available opportunity.

## 2016-10-07 NOTE — ED Provider Notes (Addendum)
-----------------------------------------   1:17 AM on 10/07/2016 -----------------------------------------   Blood pressure 123/73, pulse 88, temperature 97.7 F (36.5 C), temperature source Axillary, resp. rate 12, height 5\' 6"  (1.676 m), weight 77.1 kg, SpO2 97 %.  Assuming care from Dr. Burlene Arnt.  In short, Tammie Mcmahon is a 83 y.o. female with a chief complaint of Atrial Fibrillation and Weakness .  Refer to the original H&P for additional details.  The plan was to check a second troponin which is returned at 0.06 which is just slightly elevated compared to the last.  Her INR is therapeutic.  TSH elevated.  The degree of troponin elevation is to be expected given the significant "stress test" that her heart and do ordered after the episode of A. fib with RVR, but she has remained stable and asymptomatic for the last few hours.  I discussed with her and her family member who is present in the room and explained that we could bring her into the hospital but if she is asymptomatic and would rather go home and follow-up with her regular doctor and think that is appropriate.  They both would prefer to do that.  I gave my usual and customary return precautions.    Labs Reviewed  COMPREHENSIVE METABOLIC PANEL - Abnormal; Notable for the following:       Result Value   Glucose, Bld 148 (*)    Creatinine, Ser 1.42 (*)    AST 43 (*)    GFR calc non Af Amer 32 (*)    GFR calc Af Amer 38 (*)    All other components within normal limits  CBC WITH DIFFERENTIAL/PLATELET - Abnormal; Notable for the following:    RDW 15.2 (*)    Neutro Abs 7.3 (*)    All other components within normal limits  TROPONIN I - Abnormal; Notable for the following:    Troponin I 0.03 (*)    All other components within normal limits  PROTIME-INR - Abnormal; Notable for the following:    Prothrombin Time 23.3 (*)    All other components within normal limits  APTT - Abnormal; Notable for the following:    aPTT 38  (*)    All other components within normal limits  URINALYSIS, COMPLETE (UACMP) WITH MICROSCOPIC - Abnormal; Notable for the following:    Color, Urine YELLOW (*)    APPearance CLEAR (*)    All other components within normal limits  TSH - Abnormal; Notable for the following:    TSH 13.509 (*)    All other components within normal limits  TROPONIN I - Abnormal; Notable for the following:    Troponin I 0.06 (*)    All other components within normal limits       Hinda Kehr, MD 10/07/16 [id number]    Hinda Kehr, MD 10/07/16 0120

## 2016-10-10 ENCOUNTER — Emergency Department (HOSPITAL_COMMUNITY)
Admission: EM | Admit: 2016-10-10 | Discharge: 2016-10-10 | Disposition: A | Discharge status: Home / Self Care | Payer: Medicare Other | Attending: Physician Assistant | Admitting: Physician Assistant

## 2016-10-10 ENCOUNTER — Encounter (HOSPITAL_COMMUNITY): Payer: Self-pay

## 2016-10-10 ENCOUNTER — Emergency Department (HOSPITAL_COMMUNITY): Payer: Medicare Other

## 2016-10-10 DIAGNOSIS — R000 Tachycardia, unspecified: Secondary | ICD-10-CM | POA: Diagnosis present

## 2016-10-10 DIAGNOSIS — I130 Hypertensive heart and chronic kidney disease with heart failure and stage 1 through stage 4 chronic kidney disease, or unspecified chronic kidney disease: Secondary | ICD-10-CM | POA: Diagnosis not present

## 2016-10-10 DIAGNOSIS — I4891 Unspecified atrial fibrillation: Secondary | ICD-10-CM | POA: Diagnosis not present

## 2016-10-10 DIAGNOSIS — Z853 Personal history of malignant neoplasm of breast: Secondary | ICD-10-CM | POA: Diagnosis not present

## 2016-10-10 DIAGNOSIS — Z7901 Long term (current) use of anticoagulants: Secondary | ICD-10-CM | POA: Insufficient documentation

## 2016-10-10 DIAGNOSIS — I509 Heart failure, unspecified: Secondary | ICD-10-CM | POA: Insufficient documentation

## 2016-10-10 DIAGNOSIS — E1122 Type 2 diabetes mellitus with diabetic chronic kidney disease: Secondary | ICD-10-CM | POA: Insufficient documentation

## 2016-10-10 DIAGNOSIS — N183 Chronic kidney disease, stage 3 (moderate): Secondary | ICD-10-CM | POA: Diagnosis not present

## 2016-10-10 DIAGNOSIS — Z79899 Other long term (current) drug therapy: Secondary | ICD-10-CM | POA: Diagnosis not present

## 2016-10-10 LAB — CBC WITH DIFFERENTIAL/PLATELET
Basophils Absolute: 0 10*3/uL (ref 0.0–0.1)
Basophils Relative: 0 %
EOS PCT: 0 %
Eosinophils Absolute: 0 10*3/uL (ref 0.0–0.7)
HCT: 34.4 % — ABNORMAL LOW (ref 36.0–46.0)
Hemoglobin: 11.7 g/dL — ABNORMAL LOW (ref 12.0–15.0)
LYMPHS ABS: 1.6 10*3/uL (ref 0.7–4.0)
LYMPHS PCT: 13 %
MCH: 29.8 pg (ref 26.0–34.0)
MCHC: 34 g/dL (ref 30.0–36.0)
MCV: 87.5 fL (ref 78.0–100.0)
Monocytes Absolute: 0.3 10*3/uL (ref 0.1–1.0)
Monocytes Relative: 2 %
Neutro Abs: 10.2 10*3/uL — ABNORMAL HIGH (ref 1.7–7.7)
Neutrophils Relative %: 85 %
PLATELETS: 334 10*3/uL (ref 150–400)
RBC: 3.93 MIL/uL (ref 3.87–5.11)
RDW: 14.8 % (ref 11.5–15.5)
WBC: 12.1 10*3/uL — AB (ref 4.0–10.5)

## 2016-10-10 LAB — COMPREHENSIVE METABOLIC PANEL
ALT: 27 U/L (ref 14–54)
AST: 110 U/L — AB (ref 15–41)
Albumin: 3.5 g/dL (ref 3.5–5.0)
Alkaline Phosphatase: 65 U/L (ref 38–126)
Anion gap: 11 (ref 5–15)
BUN: 21 mg/dL — AB (ref 6–20)
CHLORIDE: 102 mmol/L (ref 101–111)
CO2: 21 mmol/L — AB (ref 22–32)
Calcium: 8.5 mg/dL — ABNORMAL LOW (ref 8.9–10.3)
Creatinine, Ser: 1.27 mg/dL — ABNORMAL HIGH (ref 0.44–1.00)
GFR calc Af Amer: 43 mL/min — ABNORMAL LOW (ref >60)
GFR, EST NON AFRICAN AMERICAN: 37 mL/min — AB (ref >60)
GLUCOSE: 137 mg/dL — AB (ref 65–99)
POTASSIUM: 5.7 mmol/L — AB (ref 3.5–5.1)
SODIUM: 134 mmol/L — AB (ref 135–145)
Total Bilirubin: 1.6 mg/dL — ABNORMAL HIGH (ref 0.3–1.2)
Total Protein: 5.8 g/dL — ABNORMAL LOW (ref 6.5–8.1)

## 2016-10-10 LAB — I-STAT TROPONIN, ED: Troponin i, poc: 0.06 ng/mL (ref 0.00–0.08)

## 2016-10-10 LAB — D-DIMER, QUANTITATIVE (NOT AT ARMC): D DIMER QUANT: 0.36 ug{FEU}/mL (ref 0.00–0.50)

## 2016-10-10 LAB — PROTIME-INR
INR: 2.09
PROTHROMBIN TIME: 23.8 s — AB (ref 11.4–15.2)

## 2016-10-10 NOTE — ED Notes (Signed)
Patient undressed, in gown, on monitor, continuous pulse oximetry and blood pressure cuff; visitor at bedside 

## 2016-10-10 NOTE — Discharge Instructions (Signed)
Call your cardiologist as soon as possible. He may need to increase some of your medications to help with your high heart rate. Please stop taking your potassium and go and get your potassium rechecked on Monday by your primary care physician

## 2016-10-10 NOTE — ED Triage Notes (Addendum)
Pt. Coming from home via [facility] for HR in 190's. EMS reports pt. In SVT upon their arrival. Pt. Given 6&12 of adenosine without converting. EMS then tried 10mg  cardizem, which converted her rhythm right away. Pt. Similar episode on 10/06/2016. Pt. Started on daily cardizem. Pt. Aox4. At this time pt. HR 92.  Pt. Also c/o L flank pain 5/10.

## 2016-10-10 NOTE — ED Provider Notes (Signed)
[facility] DEPT Provider Note   CSN: [id number] Arrival date & time: 10/10/16  1529     History   Chief Complaint Chief Complaint  Patient presents with  . Tachycardia    HPI Tammie Mcmahon is a 84 y.o. female.  HPI   Patient is an 75 her old female presenting with A. fib. Patient has history of paroxysmal A. fib. She is unsure her medications however tell that she is on metoprolol. Sounds that she started on tilt but then her heart rate was too low so her physician took her off of it. Patient went into A. fib with RVR 4 days ago. Patient additionally having dry cough. Seen by her PCP multiple times over the last 2 weeks for this cough, given prednisone.  Patient is resting when all of a sudden she felt weak, unable to stand, called EMS and found to be with an elevated heart rate in the 180s. Given adenosine 2 with no effect. Given 10 of dilt with complete conversion to regular sinus rhythm.  Past Medical History:  Diagnosis Date  . (HFpEF) heart failure with preserved ejection fraction ([place])   . Atrial fibrillation and flutter ([place])   . Breast cancer ([place])   . CKD (chronic kidney disease), stage III   . Diabetes mellitus without complication ([place])   . Hyperlipidemia   . Hypertension   . Low back pain   . Menopausal and postmenopausal disorder   . PA (pernicious anemia)   . Thyroid disease   . Valvular heart disease     Patient Active Problem List   Diagnosis Date Noted  . Confusion 09/10/2016    Past Surgical History:  Procedure Laterality Date  . BREAST SURGERY    . GALLBLADDER SURGERY    . MASTECTOMY Left   . THYROIDECTOMY      OB History    No data available       Home Medications    Prior to Admission medications   Medication Sig Start Date End Date Taking? Authorizing Provider  benzonatate (TESSALON) 200 MG capsule Take 1 capsule by mouth 3 (three) times daily as needed. 09/30/16   Historical Provider, MD  cholecalciferol (VITAMIN D)  1000 units tablet Take 1,000 Units by mouth daily.    Historical Provider, MD  ciprofloxacin (CIPRO) 500 MG tablet Take 1 tablet by mouth 2 (two) times daily. For 7 days 10/02/16   Historical Provider, MD  diclofenac sodium (VOLTAREN) 1 % GEL Apply topically 4 (four) times daily.    Historical Provider, MD  DULoxetine (CYMBALTA) 60 MG capsule Take 60 mg by mouth daily. take 1 capsule by mouth once daily    Historical Provider, MD  fluticasone (FLONASE) 50 MCG/ACT nasal spray Place 1 spray into both nostrils daily as needed for allergies.     Historical Provider, MD  levothyroxine (SYNTHROID) 50 MCG tablet Take 1 tablet (50 mcg total) by mouth daily before breakfast. 09/15/16   Epifanio Lesches, MD  metoprolol succinate (TOPROL-XL) 100 MG 24 hr tablet Take 100 mg by mouth daily. 09/18/16   Historical Provider, MD  omeprazole (PRILOSEC) 20 MG capsule Take 20 mg by mouth daily.    Historical Provider, MD  PNEUMOVAX 23 25 MCG/0.5ML injection Inject 1 Dose into the muscle once. 09/18/16   Historical Provider, MD  potassium chloride (MICRO-K) 10 MEQ CR capsule Take 10 mEq by mouth 2 (two) times daily. take 1 capsule by mouth twice a day 04/10/14   Historical Provider, MD  pravastatin (  PRAVACHOL) 40 MG tablet take 1 tablet by mouth EVERY NIGHT 05/19/14   Historical Provider, MD  torsemide (DEMADEX) 20 MG tablet Take 40 mg by mouth daily.    Historical Provider, MD  warfarin (COUMADIN) 1 MG tablet Take 1 mg by mouth as directed. Taking 3mg  daily: 1mg  tablet and 2mg  tablet; per RN at Dr. Tonette Bihari office    Historical Provider, MD  warfarin (COUMADIN) 2 MG tablet Take 2 mg by mouth daily. Taking 3mg  daily: 2mg  tablet and 1mg  tablet per RN at Dr. Tonette Bihari office    Historical Provider, MD    Family History Family History  Problem Relation Age of Onset  . Heart attack Father     Social History Social History  Substance Use Topics  . Smoking status: Never Smoker  . Smokeless tobacco: Current User     Types: Snuff  . Alcohol use No     Allergies   Patient has no known allergies.   Review of Systems Review of Systems  Constitutional: Positive for fatigue. Negative for fever.  HENT: Positive for congestion.   Respiratory: Positive for cough.   Cardiovascular: Negative for chest pain, palpitations and leg swelling.  Musculoskeletal: Negative for back pain.  Neurological: Negative for weakness.  Psychiatric/Behavioral: Negative for agitation and confusion.     Physical Exam Updated Vital Signs BP 115/77 (BP Location: Right Arm)   Pulse 92   Temp 98.2 F (36.8 C) (Oral)   Resp 15   Ht 5\' 6"  (1.676 m)   Wt 170 lb (77.1 kg)   SpO2 96%   BMI 27.44 kg/m   Physical Exam  Constitutional: She is oriented to person, place, and time. She appears well-developed and well-nourished.  HENT:  Head: Normocephalic and atraumatic.  Eyes: Right eye exhibits no discharge.  Cardiovascular: Normal rate, regular rhythm and normal heart sounds.   No murmur heard. Pulmonary/Chest: Effort normal and breath sounds normal. She has no wheezes. She has no rales.  Abdominal: Soft. She exhibits no distension. There is no tenderness.  Neurological: She is oriented to person, place, and time.  Skin: Skin is warm and dry. She is not diaphoretic.  Psychiatric: She has a normal mood and affect.  Nursing note and vitals reviewed.    ED Treatments / Results  Labs (all labs ordered are listed, but only abnormal results are displayed) Labs Reviewed  COMPREHENSIVE METABOLIC PANEL - Abnormal; Notable for the following:       Result Value   Sodium 134 (*)    Potassium 5.7 (*)    CO2 21 (*)    Glucose, Bld 137 (*)    BUN 21 (*)    Creatinine, Ser 1.27 (*)    Calcium 8.5 (*)    Total Protein 5.8 (*)    AST 110 (*)    Total Bilirubin 1.6 (*)    GFR calc non Af Amer 37 (*)    GFR calc Af Amer 43 (*)    All other components within normal limits  CBC WITH DIFFERENTIAL/PLATELET - Abnormal; Notable  for the following:    WBC 12.1 (*)    Hemoglobin 11.7 (*)    HCT 34.4 (*)    Neutro Abs 10.2 (*)    All other components within normal limits  PROTIME-INR - Abnormal; Notable for the following:    Prothrombin Time 23.8 (*)    All other components within normal limits  D-DIMER, QUANTITATIVE (NOT AT [facility])  I-STAT TROPOININ, ED    EKG  EKG Interpretation  Date/Time:  Friday October 10 2016 15:33:09 EST Ventricular Rate:  92 PR Interval:    QRS Duration: 112 QT Interval:  426 QTC Calculation: 528 R Axis:   70 Text Interpretation:  Ectopic atrial rhythm Borderline intraventricular conduction delay Repol abnrm, severe global ischemia (LM/MVD) Prolonged QT interval No significant change since last tracing Confirmed by Gerald Leitz ([id number]) on 10/10/2016 3:42:29 PM       Radiology Dg Chest 2 View  Result Date: 10/10/2016 CLINICAL DATA:  Atrial fibrillation symptoms beginning today. Chest congestion. EXAM: CHEST  2 VIEW COMPARISON:  Single-view of the chest 10/06/2016 and 09/09/2016. FINDINGS: There is cardiomegaly without edema. No consolidative process, pneumothorax or effusion. Aortic atherosclerosis is noted. Thoracolumbar scoliosis is seen. IMPRESSION: Cardiomegaly without acute disease. Atherosclerosis. Electronically Signed   By: Inge Rise M.D.   On: 10/10/2016 18:11    Procedures Procedures (including critical care time)  Medications Ordered in ED Medications - No data to display   Initial Impression / Assessment and Plan / ED Course  I have reviewed the triage vital signs and the nursing notes.  Pertinent labs & imaging results that were available during my care of the patient were reviewed by me and considered in my medical decision making (see chart for details).  Clinical Course     Patient is a 71 her old female with proximal A. fib presenting in A. fib with RVR. Patient converted prior to arrival. This is her second ER visit in last 5 days for similar  thing. I'm concerned that patient could have underlying cause for this. Patient has had A. fib it sounds like for over a year did not have any episodes of paroxysmal A. fib. Patient had this dry cough, mildly improved prednisone. We'll get d-dimer at this time.  Neg trop, she had no chest pain at the time so no need to repeat.  CXR normal, d dimer negatiev  Pt will call cardiologsit in am for follow up.  It sounds if she was placed on tilt and this caused her heart rate to be too low. So therefore we will have cardiologist try to manage this.  Patient is comfortable, ambulatory, and taking PO at time of discharge.  Patient expressed understanding about return precautions.    Final Clinical Impressions(s) / ED Diagnoses   Final diagnoses:  Atrial fibrillation with RVR [facility])    New Prescriptions New Prescriptions   No medications on file     Alexy Bringle Julio Alm, MD 10/10/16 1835

## 2016-10-13 ENCOUNTER — Encounter: Payer: Self-pay | Admitting: Emergency Medicine

## 2016-10-13 ENCOUNTER — Emergency Department
Admission: EM | Admit: 2016-10-13 | Discharge: 2016-10-13 | Disposition: A | Discharge status: Home / Self Care | Payer: Medicare Other | Attending: Emergency Medicine | Admitting: Emergency Medicine

## 2016-10-13 ENCOUNTER — Emergency Department: Payer: Medicare Other

## 2016-10-13 DIAGNOSIS — R55 Syncope and collapse: Secondary | ICD-10-CM | POA: Diagnosis not present

## 2016-10-13 DIAGNOSIS — I130 Hypertensive heart and chronic kidney disease with heart failure and stage 1 through stage 4 chronic kidney disease, or unspecified chronic kidney disease: Secondary | ICD-10-CM | POA: Diagnosis not present

## 2016-10-13 DIAGNOSIS — Z7901 Long term (current) use of anticoagulants: Secondary | ICD-10-CM | POA: Diagnosis not present

## 2016-10-13 DIAGNOSIS — E1122 Type 2 diabetes mellitus with diabetic chronic kidney disease: Secondary | ICD-10-CM | POA: Diagnosis not present

## 2016-10-13 DIAGNOSIS — I5030 Unspecified diastolic (congestive) heart failure: Secondary | ICD-10-CM | POA: Insufficient documentation

## 2016-10-13 DIAGNOSIS — N183 Chronic kidney disease, stage 3 (moderate): Secondary | ICD-10-CM | POA: Insufficient documentation

## 2016-10-13 DIAGNOSIS — F17290 Nicotine dependence, other tobacco product, uncomplicated: Secondary | ICD-10-CM | POA: Insufficient documentation

## 2016-10-13 DIAGNOSIS — Z79899 Other long term (current) drug therapy: Secondary | ICD-10-CM | POA: Diagnosis not present

## 2016-10-13 DIAGNOSIS — Z853 Personal history of malignant neoplasm of breast: Secondary | ICD-10-CM | POA: Insufficient documentation

## 2016-10-13 LAB — CBC WITH DIFFERENTIAL/PLATELET
Basophils Absolute: 0.1 10*3/uL (ref 0–0.1)
Basophils Relative: 1 %
Eosinophils Absolute: 0 10*3/uL (ref 0–0.7)
Eosinophils Relative: 0 %
HCT: 38.4 % (ref 35.0–47.0)
Hemoglobin: 12.4 g/dL (ref 12.0–16.0)
Lymphocytes Relative: 24 %
Lymphs Abs: 2.8 10*3/uL (ref 1.0–3.6)
MCH: 28.9 pg (ref 26.0–34.0)
MCHC: 32.4 g/dL (ref 32.0–36.0)
MCV: 89.2 fL (ref 80.0–100.0)
Monocytes Absolute: 0.7 10*3/uL (ref 0.2–0.9)
Monocytes Relative: 6 %
Neutro Abs: 8 10*3/uL — ABNORMAL HIGH (ref 1.4–6.5)
Neutrophils Relative %: 69 %
Platelets: 361 10*3/uL (ref 150–440)
RBC: 4.3 MIL/uL (ref 3.80–5.20)
RDW: 15.1 % — ABNORMAL HIGH (ref 11.5–14.5)
WBC: 11.7 10*3/uL — ABNORMAL HIGH (ref 3.6–11.0)

## 2016-10-13 LAB — BASIC METABOLIC PANEL
Anion gap: 11 (ref 5–15)
BUN: 19 mg/dL (ref 6–20)
CHLORIDE: 95 mmol/L — AB (ref 101–111)
CO2: 28 mmol/L (ref 22–32)
Calcium: 8.5 mg/dL — ABNORMAL LOW (ref 8.9–10.3)
Creatinine, Ser: 1.13 mg/dL — ABNORMAL HIGH (ref 0.44–1.00)
GFR calc non Af Amer: 43 mL/min — ABNORMAL LOW (ref >60)
GFR, EST AFRICAN AMERICAN: 50 mL/min — AB (ref >60)
Glucose, Bld: 136 mg/dL — ABNORMAL HIGH (ref 65–99)
POTASSIUM: 3 mmol/L — AB (ref 3.5–5.1)
SODIUM: 134 mmol/L — AB (ref 135–145)

## 2016-10-13 LAB — TROPONIN I
TROPONIN I: 0.05 ng/mL — AB (ref <0.03)
Troponin I: 0.06 ng/mL (ref <0.03)

## 2016-10-13 MED ORDER — CYCLOBENZAPRINE HCL 10 MG PO TABS: 5.0000 mg | ORAL_TABLET | Freq: Once | ORAL | Status: DC

## 2016-10-13 MED ORDER — POTASSIUM CHLORIDE CRYS ER 20 MEQ PO TBCR
40.0000 meq | EXTENDED_RELEASE_TABLET | Freq: Once | ORAL | Status: AC
  Administered 2016-10-13: 40 meq via ORAL
  Filled 2016-10-13: qty 2

## 2016-10-13 MED ORDER — DILTIAZEM HCL 25 MG/5ML IV SOLN
15.0000 mg | Freq: Once | INTRAVENOUS | Status: DC
  Filled 2016-10-13: qty 5

## 2016-10-13 NOTE — ED Notes (Signed)
Trop 0.05 MD notified

## 2016-10-13 NOTE — Discharge Instructions (Signed)
Please seek medical attention for any high fevers, chest pain, shortness of breath, change in behavior, persistent vomiting, bloody stool or any other new or concerning symptoms.  

## 2016-10-13 NOTE — ED Notes (Signed)
Patient transported to X-ray 

## 2016-10-13 NOTE — ED Notes (Signed)
Pt placed on bedpan at this time.

## 2016-10-13 NOTE — ED Provider Notes (Signed)
[facility] Emergency Department Provider Note   ____________________________________________   I have reviewed the triage vital signs and the nursing notes.   HISTORY  Chief Complaint Near Syncope   History limited by: Not Limited   HPI Tammie Mcmahon is a 83 y.o. female who presents to the emergency department today via EMS after a syncopal episode. The patient was getting ready to go to a cardiology appointment when she passed out. Family member called EMS to help get patient off the floor. The patient denied any chest pain or palpitations with the syncopal episode. The patient had another syncopal episode when EMS got there. The patient was found to be in afib with RVR for EMS. Blood pressure was good. The patient has now been to the emergency department three times in the past week, both previous times was also found to have afib with RVR. Did convert after diltiazem. The patient was going to follow up with her cardiologist today about these ED visits. The patient denies any fevers, nausea or vomiting.    Past Medical History:  Diagnosis Date  . (HFpEF) heart failure with preserved ejection fraction ([place])   . Atrial fibrillation and flutter ([place])   . Breast cancer ([place])   . CKD (chronic kidney disease), stage III   . Diabetes mellitus without complication ([place])   . Hyperlipidemia   . Hypertension   . Low back pain   . Menopausal and postmenopausal disorder   . PA (pernicious anemia)   . Thyroid disease   . Valvular heart disease     Patient Active Problem List   Diagnosis Date Noted  . Confusion 09/10/2016    Past Surgical History:  Procedure Laterality Date  . BREAST SURGERY    . GALLBLADDER SURGERY    . MASTECTOMY Left   . THYROIDECTOMY      Prior to Admission medications   Medication Sig Start Date End Date Taking? Authorizing Provider  benzonatate (TESSALON) 200 MG capsule Take 1 capsule by mouth 3 (three) times daily as  needed. 09/30/16   Historical Provider, MD  cholecalciferol (VITAMIN D) 1000 units tablet Take 1,000 Units by mouth daily.    Historical Provider, MD  ciprofloxacin (CIPRO) 500 MG tablet Take 1 tablet by mouth 2 (two) times daily. For 7 days 10/02/16   Historical Provider, MD  diclofenac sodium (VOLTAREN) 1 % GEL Apply topically 4 (four) times daily.    Historical Provider, MD  DULoxetine (CYMBALTA) 60 MG capsule Take 60 mg by mouth daily. take 1 capsule by mouth once daily    Historical Provider, MD  fluticasone (FLONASE) 50 MCG/ACT nasal spray Place 1 spray into both nostrils daily as needed for allergies.     Historical Provider, MD  levothyroxine (SYNTHROID) 50 MCG tablet Take 1 tablet (50 mcg total) by mouth daily before breakfast. 09/15/16   Epifanio Lesches, MD  metoprolol succinate (TOPROL-XL) 100 MG 24 hr tablet Take 100 mg by mouth daily. 09/18/16   Historical Provider, MD  omeprazole (PRILOSEC) 20 MG capsule Take 20 mg by mouth daily.    Historical Provider, MD  potassium chloride (MICRO-K) 10 MEQ CR capsule Take 10 mEq by mouth 2 (two) times daily. take 1 capsule by mouth twice a day 04/10/14   Historical Provider, MD  pravastatin (PRAVACHOL) 40 MG tablet take 1 tablet by mouth EVERY NIGHT 05/19/14   Historical Provider, MD  torsemide (DEMADEX) 20 MG tablet Take 40 mg by mouth daily.    Historical Provider,  MD  warfarin (COUMADIN) 1 MG tablet Take 1 mg by mouth as directed. Taking 3mg  daily: 1mg  tablet and 2mg  tablet; per RN at Dr. Tonette Bihari office    Historical Provider, MD  warfarin (COUMADIN) 2 MG tablet Take 2 mg by mouth daily. Taking 3mg  daily: 2mg  tablet and 1mg  tablet per RN at Dr. Tonette Bihari office    Historical Provider, MD    Allergies Patient has no known allergies.  Family History  Problem Relation Age of Onset  . Heart attack Father     Social History Social History  Substance Use Topics  . Smoking status: Never Smoker  . Smokeless tobacco: Current User     Types: Snuff  . Alcohol use No    Review of Systems  Constitutional: Negative for fever. Positive for weakness.  Cardiovascular: Negative for chest pain. Respiratory: Negative for shortness of breath. Gastrointestinal: Negative for abdominal pain, vomiting and diarrhea. Genitourinary: Negative for dysuria. Musculoskeletal: Negative for back pain. Skin: Negative for rash. Neurological: Negative for headaches, focal weakness or numbness.  10-point ROS otherwise negative.  ____________________________________________   PHYSICAL EXAM:  VITAL SIGNS: ED Triage Vitals  Enc Vitals Group     BP 10/13/16 1118 (!) 147/74     Pulse Rate 10/13/16 1118 (!) 119     Resp 10/13/16 1118 12     Temp 10/13/16 1118 98 F (36.7 C)     Temp Source 10/13/16 1118 Oral     SpO2 10/13/16 1118 98 %     Weight --      Height --      Head Circumference --      Peak Flow --      Pain Score 10/13/16 1119 0    Constitutional: Alert and oriented. Well appearing and in no distress. Eyes: Conjunctivae are normal. Normal extraocular movements. ENT   Head: Normocephalic and atraumatic.   Nose: No congestion/rhinnorhea.   Mouth/Throat: Mucous membranes are moist.   Neck: No stridor. Hematological/Lymphatic/Immunilogical: No cervical lymphadenopathy. Cardiovascular: Tachycardic, irregularly irregular rate.   No murmurs, rubs, or gallops.  Respiratory: Normal respiratory effort without tachypnea nor retractions. Breath sounds are clear and equal bilaterally. No wheezes/rales/rhonchi. Gastrointestinal: Soft and non tender. No rebound. No guarding.  Genitourinary: Deferred Musculoskeletal: Normal range of motion in all extremities. No lower extremity edema. Neurologic:  Normal speech and language. No gross focal neurologic deficits are appreciated.  Skin:  Skin is warm, dry and intact. No rash noted. Psychiatric: Mood and affect are normal. Speech and behavior are normal. Patient exhibits  appropriate insight and judgment.  ____________________________________________    LABS (pertinent positives/negatives)  Labs Reviewed  CBC WITH DIFFERENTIAL/PLATELET - Abnormal; Notable for the following:       Result Value   WBC 11.7 (*)    RDW 15.1 (*)    Neutro Abs 8.0 (*)    All other components within normal limits  BASIC METABOLIC PANEL - Abnormal; Notable for the following:    Sodium 134 (*)    Potassium 3.0 (*)    Chloride 95 (*)    Glucose, Bld 136 (*)    Creatinine, Ser 1.13 (*)    Calcium 8.5 (*)    GFR calc non Af Amer 43 (*)    GFR calc Af Amer 50 (*)    All other components within normal limits  TROPONIN I - Abnormal; Notable for the following:    Troponin I 0.05 (*)    All other components within normal limits  TROPONIN I -  Abnormal; Notable for the following:    Troponin I 0.06 (*)    All other components within normal limits     ____________________________________________   EKG  I, Nance Pear, attending physician, personally viewed and interpreted this EKG  EKG Time: 1116 Rate: 132 Rhythm: sinus rhythm with PACs in a bigeminal pattern Axis: normal Intervals: qtc 381 QRS: narrow ST changes: no st elevation Impression: abnormal ekg  I, Nance Pear, attending physician, personally viewed and interpreted this EKG  EKG Time: 1345 Rate: 94 Rhythm: sinus rhythm Axis: normal Intervals: qtc 586 QRS: narrow, RSR' in V1 ST changes: no st elevation Impression: abnormal ekg   ____________________________________________    RADIOLOGY  CXR IMPRESSION:  Chronic bronchitic changes bilaterally. Mild cardiomegaly with  central pulmonary vascular prominence may reflect low-grade  compensated CHF.    Thoracic aortic atherosclerosis.    Moderately increase colonic stool burden may reflect constipation in  the appropriate clinical setting. Small amount of gas within normal  caliber small bowel loops is likely normal. There is no  evidence of  obstruction or perforation.   ____________________________________________   PROCEDURES  Procedures  ____________________________________________   INITIAL IMPRESSION / ASSESSMENT AND PLAN / ED COURSE  Pertinent labs & imaging results that were available during my care of the patient were reviewed by me and considered in my medical decision making (see chart for details).  Patient presented to the emergency department today after syncopal episode.Patient initially in a bigeminal pattern. This did however spontaneously revert to sinus rhythm. Patient's blood work showed a slightly elevated troponin. Repeat troponin without significant change. Patient did stand up and tolerated it well. Will plan on having patient follow up with cardiology.   ____________________________________________   FINAL CLINICAL IMPRESSION(S) / ED DIAGNOSES  Final diagnoses:  Syncope, unspecified syncope type     Note: This dictation was prepared with Dragon dictation. Any transcriptional errors that result from this process are unintentional     Nance Pear, MD 10/13/16 [id number]

## 2016-10-13 NOTE — ED Triage Notes (Signed)
Pt to ED via EMS for witnessed syncopal episodes. Per EMS pt was at PCP and had 2 witnessed syncopal episodes, LOC aprox 10 seconds. Pt hx of a-fib, HR 90-130s, EMS gave zofran for nausea. Pt taken off Cardizem due to bradycardia. Pt denies any CP or SOB. Pt A&Ox4

## 2016-10-16 ENCOUNTER — Observation Stay
Admission: EM | Admit: 2016-10-16 | Discharge: 2016-10-18 | Disposition: A | Discharge status: Home / Self Care | Payer: Medicare Other | Attending: Internal Medicine | Admitting: Internal Medicine

## 2016-10-16 ENCOUNTER — Encounter: Payer: Self-pay | Admitting: Emergency Medicine

## 2016-10-16 ENCOUNTER — Emergency Department: Payer: Medicare Other

## 2016-10-16 DIAGNOSIS — I480 Paroxysmal atrial fibrillation: Secondary | ICD-10-CM | POA: Insufficient documentation

## 2016-10-16 DIAGNOSIS — Z853 Personal history of malignant neoplasm of breast: Secondary | ICD-10-CM | POA: Diagnosis not present

## 2016-10-16 DIAGNOSIS — I130 Hypertensive heart and chronic kidney disease with heart failure and stage 1 through stage 4 chronic kidney disease, or unspecified chronic kidney disease: Secondary | ICD-10-CM | POA: Diagnosis not present

## 2016-10-16 DIAGNOSIS — J449 Chronic obstructive pulmonary disease, unspecified: Secondary | ICD-10-CM | POA: Diagnosis not present

## 2016-10-16 DIAGNOSIS — E782 Mixed hyperlipidemia: Secondary | ICD-10-CM | POA: Diagnosis not present

## 2016-10-16 DIAGNOSIS — E1165 Type 2 diabetes mellitus with hyperglycemia: Secondary | ICD-10-CM | POA: Diagnosis not present

## 2016-10-16 DIAGNOSIS — D510 Vitamin B12 deficiency anemia due to intrinsic factor deficiency: Secondary | ICD-10-CM | POA: Diagnosis not present

## 2016-10-16 DIAGNOSIS — I4892 Unspecified atrial flutter: Secondary | ICD-10-CM | POA: Insufficient documentation

## 2016-10-16 DIAGNOSIS — M8580 Other specified disorders of bone density and structure, unspecified site: Secondary | ICD-10-CM | POA: Insufficient documentation

## 2016-10-16 DIAGNOSIS — Z9049 Acquired absence of other specified parts of digestive tract: Secondary | ICD-10-CM | POA: Diagnosis not present

## 2016-10-16 DIAGNOSIS — Z9012 Acquired absence of left breast and nipple: Secondary | ICD-10-CM | POA: Insufficient documentation

## 2016-10-16 DIAGNOSIS — K5730 Diverticulosis of large intestine without perforation or abscess without bleeding: Secondary | ICD-10-CM | POA: Insufficient documentation

## 2016-10-16 DIAGNOSIS — I700 Atherosclerosis of aorta: Secondary | ICD-10-CM | POA: Diagnosis not present

## 2016-10-16 DIAGNOSIS — E785 Hyperlipidemia, unspecified: Secondary | ICD-10-CM | POA: Insufficient documentation

## 2016-10-16 DIAGNOSIS — K5900 Constipation, unspecified: Secondary | ICD-10-CM | POA: Diagnosis not present

## 2016-10-16 DIAGNOSIS — E039 Hypothyroidism, unspecified: Secondary | ICD-10-CM | POA: Diagnosis not present

## 2016-10-16 DIAGNOSIS — I4891 Unspecified atrial fibrillation: Secondary | ICD-10-CM

## 2016-10-16 DIAGNOSIS — N183 Chronic kidney disease, stage 3 (moderate): Secondary | ICD-10-CM | POA: Insufficient documentation

## 2016-10-16 DIAGNOSIS — E1122 Type 2 diabetes mellitus with diabetic chronic kidney disease: Secondary | ICD-10-CM | POA: Diagnosis not present

## 2016-10-16 DIAGNOSIS — I471 Supraventricular tachycardia: Principal | ICD-10-CM | POA: Insufficient documentation

## 2016-10-16 DIAGNOSIS — I5033 Acute on chronic diastolic (congestive) heart failure: Secondary | ICD-10-CM | POA: Diagnosis not present

## 2016-10-16 DIAGNOSIS — Z7901 Long term (current) use of anticoagulants: Secondary | ICD-10-CM | POA: Diagnosis not present

## 2016-10-16 DIAGNOSIS — K449 Diaphragmatic hernia without obstruction or gangrene: Secondary | ICD-10-CM | POA: Insufficient documentation

## 2016-10-16 DIAGNOSIS — I498 Other specified cardiac arrhythmias: Secondary | ICD-10-CM

## 2016-10-16 LAB — CBC
HEMATOCRIT: 40.1 % (ref 35.0–47.0)
HEMATOCRIT: 36.1 % (ref 35.0–47.0)
Hemoglobin: 13.2 g/dL (ref 12.0–16.0)
Hemoglobin: 12.1 g/dL (ref 12.0–16.0)
MCH: 29.5 pg (ref 26.0–34.0)
MCH: 29.8 pg (ref 26.0–34.0)
MCHC: 32.9 g/dL (ref 32.0–36.0)
MCHC: 33.4 g/dL (ref 32.0–36.0)
MCV: 89.5 fL (ref 80.0–100.0)
MCV: 89 fL (ref 80.0–100.0)
Platelets: 373 10*3/uL (ref 150–440)
Platelets: 339 10*3/uL (ref 150–440)
RBC: 4.48 MIL/uL (ref 3.80–5.20)
RBC: 4.05 MIL/uL (ref 3.80–5.20)
RDW: 15.5 % — AB (ref 11.5–14.5)
RDW: 15.4 % — AB (ref 11.5–14.5)
WBC: 13.8 10*3/uL — ABNORMAL HIGH (ref 3.6–11.0)
WBC: 11.4 10*3/uL — AB (ref 3.6–11.0)

## 2016-10-16 LAB — HEPATIC FUNCTION PANEL
ALK PHOS: 74 U/L (ref 38–126)
ALT: 29 U/L (ref 14–54)
AST: 40 U/L (ref 15–41)
Albumin: 3.9 g/dL (ref 3.5–5.0)
Bilirubin, Direct: <0.1 mg/dL — ABNORMAL LOW (ref 0.1–0.5)
TOTAL PROTEIN: 6.9 g/dL (ref 6.5–8.1)
Total Bilirubin: 0.5 mg/dL (ref 0.3–1.2)

## 2016-10-16 LAB — BASIC METABOLIC PANEL
Anion gap: 10 (ref 5–15)
BUN: 15 mg/dL (ref 6–20)
CHLORIDE: 98 mmol/L — AB (ref 101–111)
CO2: 27 mmol/L (ref 22–32)
Calcium: 8.7 mg/dL — ABNORMAL LOW (ref 8.9–10.3)
Creatinine, Ser: 1.04 mg/dL — ABNORMAL HIGH (ref 0.44–1.00)
GFR calc Af Amer: 55 mL/min — ABNORMAL LOW (ref >60)
GFR calc non Af Amer: 47 mL/min — ABNORMAL LOW (ref >60)
GLUCOSE: 145 mg/dL — AB (ref 65–99)
POTASSIUM: 3.9 mmol/L (ref 3.5–5.1)
Sodium: 135 mmol/L (ref 135–145)

## 2016-10-16 LAB — CREATININE, SERUM
Creatinine, Ser: 1 mg/dL (ref 0.44–1.00)
GFR calc non Af Amer: 50 mL/min — ABNORMAL LOW (ref >60)
GFR, EST AFRICAN AMERICAN: 57 mL/min — AB (ref >60)

## 2016-10-16 LAB — TSH
TSH: 9.448 u[IU]/mL — ABNORMAL HIGH (ref 0.350–4.500)
TSH: 4.838 u[IU]/mL — ABNORMAL HIGH (ref 0.350–4.500)

## 2016-10-16 LAB — LIPASE, BLOOD: Lipase: 14 U/L (ref 11–51)

## 2016-10-16 LAB — MAGNESIUM: Magnesium: 2.2 mg/dL (ref 1.7–2.4)

## 2016-10-16 LAB — TROPONIN I
Troponin I: 0.03 ng/mL (ref <0.03)
Troponin I: 0.04 ng/mL (ref <0.03)
Troponin I: 0.03 ng/mL (ref <0.03)

## 2016-10-16 LAB — T4, FREE: FREE T4: 0.76 ng/dL (ref 0.61–1.12)

## 2016-10-16 LAB — PROTIME-INR
INR: 1.59
Prothrombin Time: 19.1 seconds — ABNORMAL HIGH (ref 11.4–15.2)

## 2016-10-16 MED ORDER — SODIUM CHLORIDE 0.9% FLUSH
3.0000 mL | Freq: Two times a day (BID) | INTRAVENOUS | Status: DC
  Administered 2016-10-16 – 2016-10-17 (×3): 3 mL via INTRAVENOUS

## 2016-10-16 MED ORDER — LEVOTHYROXINE SODIUM 50 MCG PO TABS
50.0000 ug | ORAL_TABLET | Freq: Every day | ORAL | Status: DC
  Administered 2016-10-17 – 2016-10-18 (×2): 50 ug via ORAL
  Filled 2016-10-16 (×2): qty 1

## 2016-10-16 MED ORDER — ACETAMINOPHEN 325 MG PO TABS: 650.0000 mg | ORAL_TABLET | Freq: Four times a day (QID) | ORAL | Status: DC | PRN

## 2016-10-16 MED ORDER — WARFARIN SODIUM 1 MG PO TABS
3.0000 mg | ORAL_TABLET | Freq: Every day | ORAL | Status: DC
  Administered 2016-10-17: 3 mg via ORAL
  Filled 2016-10-16: qty 3

## 2016-10-16 MED ORDER — SODIUM CHLORIDE 0.9% FLUSH: 3.0000 mL | INTRAVENOUS | Status: DC | PRN

## 2016-10-16 MED ORDER — PANTOPRAZOLE SODIUM 40 MG PO TBEC
40.0000 mg | DELAYED_RELEASE_TABLET | Freq: Every day | ORAL | Status: DC
  Administered 2016-10-16 – 2016-10-18 (×3): 40 mg via ORAL
  Filled 2016-10-16 (×3): qty 1

## 2016-10-16 MED ORDER — POTASSIUM CHLORIDE CRYS ER 10 MEQ PO TBCR
10.0000 meq | EXTENDED_RELEASE_TABLET | Freq: Every day | ORAL | Status: DC
  Administered 2016-10-16 – 2016-10-18 (×3): 10 meq via ORAL
  Filled 2016-10-16 (×3): qty 1

## 2016-10-16 MED ORDER — METOPROLOL SUCCINATE ER 100 MG PO TB24
100.0000 mg | ORAL_TABLET | Freq: Every day | ORAL | Status: DC
  Administered 2016-10-17 – 2016-10-18 (×2): 100 mg via ORAL
  Filled 2016-10-16 (×2): qty 1
  Filled 2016-10-16: qty 2

## 2016-10-16 MED ORDER — WARFARIN SODIUM 2 MG PO TABS: 2.0000 mg | ORAL_TABLET | Freq: Every day | ORAL | Status: DC

## 2016-10-16 MED ORDER — ASPIRIN 81 MG PO CHEW
324.0000 mg | CHEWABLE_TABLET | Freq: Once | ORAL | Status: AC
  Administered 2016-10-16: 324 mg via ORAL
  Filled 2016-10-16: qty 4

## 2016-10-16 MED ORDER — BENZONATATE 100 MG PO CAPS: 200.0000 mg | ORAL_CAPSULE | ORAL | Status: DC | PRN

## 2016-10-16 MED ORDER — ONDANSETRON HCL 4 MG PO TABS: 4.0000 mg | ORAL_TABLET | Freq: Four times a day (QID) | ORAL | Status: DC | PRN

## 2016-10-16 MED ORDER — METOPROLOL TARTRATE 50 MG PO TABS
50.0000 mg | ORAL_TABLET | Freq: Once | ORAL | Status: AC
  Administered 2016-10-16: 50 mg via ORAL
  Filled 2016-10-16: qty 1

## 2016-10-16 MED ORDER — ACETAMINOPHEN 650 MG RE SUPP
650.0000 mg | Freq: Four times a day (QID) | RECTAL | Status: DC | PRN
Start: 2016-10-16 — End: 2016-10-18

## 2016-10-16 MED ORDER — DICLOFENAC SODIUM 1 % TD GEL
2.0000 g | Freq: Four times a day (QID) | TRANSDERMAL | Status: DC
  Filled 2016-10-16: qty 100

## 2016-10-16 MED ORDER — WARFARIN SODIUM 1 MG PO TABS: 1.0000 mg | ORAL_TABLET | ORAL | Status: DC

## 2016-10-16 MED ORDER — SODIUM CHLORIDE 0.9% FLUSH
3.0000 mL | Freq: Two times a day (BID) | INTRAVENOUS | Status: DC
  Administered 2016-10-17 (×2): 3 mL via INTRAVENOUS

## 2016-10-16 MED ORDER — SODIUM CHLORIDE 0.9 % IV SOLN: 250.0000 mL | INTRAVENOUS | Status: DC | PRN

## 2016-10-16 MED ORDER — METOPROLOL TARTRATE 5 MG/5ML IV SOLN
5.0000 mg | Freq: Once | INTRAVENOUS | Status: AC
  Administered 2016-10-16: 5 mg via INTRAVENOUS
  Filled 2016-10-16: qty 5

## 2016-10-16 MED ORDER — METOPROLOL TARTRATE 5 MG/5ML IV SOLN: 5.0000 mg | Freq: Once | INTRAVENOUS | Status: DC

## 2016-10-16 MED ORDER — ASPIRIN EC 81 MG PO TBEC
81.0000 mg | DELAYED_RELEASE_TABLET | Freq: Every day | ORAL | Status: DC
  Administered 2016-10-17 – 2016-10-18 (×2): 81 mg via ORAL
  Filled 2016-10-16 (×2): qty 1

## 2016-10-16 MED ORDER — MAGNESIUM SULFATE 2 GM/50ML IV SOLN
2.0000 g | Freq: Once | INTRAVENOUS | Status: AC
  Administered 2016-10-16: 2 g via INTRAVENOUS
  Filled 2016-10-16: qty 50

## 2016-10-16 MED ORDER — WARFARIN - PHARMACIST DOSING INPATIENT
Freq: Every day | Status: DC
  Administered 2016-10-17: 18:00:00
  Filled 2016-10-16 (×7): qty 1

## 2016-10-16 MED ORDER — ONDANSETRON HCL 4 MG/2ML IJ SOLN: 4.0000 mg | Freq: Four times a day (QID) | INTRAMUSCULAR | Status: DC | PRN

## 2016-10-16 MED ORDER — WARFARIN SODIUM 5 MG PO TABS
5.0000 mg | ORAL_TABLET | Freq: Once | ORAL | Status: DC
  Filled 2016-10-16: qty 1

## 2016-10-16 MED ORDER — TORSEMIDE 20 MG PO TABS
40.0000 mg | ORAL_TABLET | Freq: Every day | ORAL | Status: DC
  Administered 2016-10-17 – 2016-10-18 (×2): 40 mg via ORAL
  Filled 2016-10-16 (×3): qty 2

## 2016-10-16 NOTE — ED Notes (Signed)
Admission RN at bedside. 

## 2016-10-16 NOTE — ED Notes (Signed)
Tech assisted pt to toilet for BM. Pt remained on O2. Pt wet linens changed and pt given warm blanket. Pt tolerated all movement well.

## 2016-10-16 NOTE — H&P (Signed)
[facility] [place] NAME: Tammie Mcmahon    MR#:  [id number]  DATE OF BIRTH:  16-Oct-1927  DATE OF ADMISSION:  10/16/2016  PRIMARY CARE PHYSICIAN: Kirk Ruths., MD   REQUESTING/REFERRING PHYSICIAN:   CHIEF COMPLAINT:   Chief Complaint  Patient presents with  . Tachycardia    HISTORY OF PRESENT ILLNESS: Tammie Mcmahon  is a 83 y.o. female with a known history of Diastolic CHF, atrial fibrillation, flutter, on chronic anticoagulation, breast cancer, CK D stage III, diabetes, hyperlipidemia, who presents to the hospital with complaints of SVT with rate of 190. Apparently patient was doing well up until today when she started having palpitations, she was sent from cardiologist's office 3 to emergency room for admission. Since she's been having intermittent palpitations, not responsive to conventional medications including Cardizem and metoprolol. While in the emergency room, she was also complaining of right lower quadrant abdominal pain with no relieving or aggravating symptoms, severe constipation, no bowel movement for the past one week. She denied any chest pains, lightheadedness or dizziness or feeling presyncopal, admitted of nausea  PAST MEDICAL HISTORY:   Past Medical History:  Diagnosis Date  . (HFpEF) heart failure with preserved ejection fraction ([place])   . Atrial fibrillation and flutter ([place])   . Breast cancer ([place])   . CKD (chronic kidney disease), stage III   . Diabetes mellitus without complication ([place])   . Hyperlipidemia   . Hypertension   . Low back pain   . Menopausal and postmenopausal disorder   . PA (pernicious anemia)   . Thyroid disease   . Valvular heart disease     PAST SURGICAL HISTORY: Past Surgical History:  Procedure Laterality Date  . BREAST SURGERY    . GALLBLADDER SURGERY    . MASTECTOMY Left   . THYROIDECTOMY      SOCIAL HISTORY:  Social History  Substance Use Topics  . Smoking status:  Never Smoker  . Smokeless tobacco: Current User    Types: Snuff  . Alcohol use No    FAMILY HISTORY:  Family History  Problem Relation Age of Onset  . Heart attack Father     DRUG ALLERGIES: No Known Allergies  Review of Systems  Constitutional: Negative for chills, fever and weight loss.  HENT: Negative for congestion.   Eyes: Negative for blurred vision and double vision.  Respiratory: Negative for cough, sputum production, shortness of breath and wheezing.   Cardiovascular: Positive for palpitations. Negative for chest pain, orthopnea, leg swelling and PND.  Gastrointestinal: Positive for abdominal pain, constipation and nausea. Negative for blood in stool, diarrhea and vomiting.  Genitourinary: Negative for dysuria, frequency, hematuria and urgency.  Musculoskeletal: Negative for falls.  Neurological: Negative for dizziness, tremors, focal weakness and headaches.  Endo/Heme/Allergies: Does not bruise/bleed easily.  Psychiatric/Behavioral: Negative for depression. The patient does not have insomnia.     MEDICATIONS AT HOME:  Prior to Admission medications   Medication Sig Start Date End Date Taking? Authorizing Provider  cholecalciferol (VITAMIN D) 1000 units tablet Take 1,000 Units by mouth daily.   Yes Historical Provider, MD  ciprofloxacin (CIPRO) 500 MG tablet Take 1 tablet by mouth 2 (two) times daily. For 7 days 10/02/16  Yes Historical Provider, MD  DULoxetine (CYMBALTA) 60 MG capsule Take 60 mg by mouth daily. take 1 capsule by mouth once daily   Yes Historical Provider, MD  levothyroxine (SYNTHROID) 50 MCG tablet Take 1 tablet (50 mcg total) by  mouth daily before breakfast. 09/15/16  Yes Epifanio Lesches, MD  metoprolol succinate (TOPROL-XL) 100 MG 24 hr tablet Take 100 mg by mouth daily. 09/18/16  Yes Historical Provider, MD  omeprazole (PRILOSEC) 20 MG capsule Take 20 mg by mouth daily.   Yes Historical Provider, MD  pravastatin (PRAVACHOL) 40 MG tablet take 1  tablet by mouth EVERY NIGHT 05/19/14  Yes Historical Provider, MD  torsemide (DEMADEX) 20 MG tablet Take 40 mg by mouth daily.   Yes Historical Provider, MD  warfarin (COUMADIN) 1 MG tablet Take 1 mg by mouth as directed. Taking 3mg  daily: 1mg  tablet and 2mg  tablet; per RN at Dr. Tonette Bihari office   Yes Historical Provider, MD  warfarin (COUMADIN) 2 MG tablet Take 2 mg by mouth daily. Taking 3mg  daily: 2mg  tablet and 1mg  tablet per RN at Dr. Tonette Bihari office   Yes Historical Provider, MD  azelastine (ASTELIN) 0.1 % nasal spray Place 1 spray into both nostrils daily as needed. 09/30/16   Historical Provider, MD  benzonatate (TESSALON) 200 MG capsule Take 1 capsule by mouth 3 (three) times daily as needed. 09/30/16   Historical Provider, MD  diclofenac sodium (VOLTAREN) 1 % GEL Apply topically 4 (four) times daily.    Historical Provider, MD  potassium chloride (MICRO-K) 10 MEQ CR capsule Take 10 mEq by mouth 2 (two) times daily. take 1 capsule by mouth twice a day 04/10/14   Historical Provider, MD      PHYSICAL EXAMINATION:   VITAL SIGNS: Blood pressure 132/80, pulse 92, temperature 98.1 F (36.7 C), temperature source Oral, resp. rate 17, SpO2 (!) 87 %.  GENERAL:  83 y.o.-year-old patient lying in the bed with no acute distress.  EYES: Pupils equal, round, reactive to light and accommodation. No scleral icterus. Extraocular muscles intact.  HEENT: Head atraumatic, normocephalic. Oropharynx and nasopharynx clear.  NECK:  Supple, no jugular venous distention. No thyroid enlargement, no tenderness.  LUNGS: Normal breath sounds bilaterally, no wheezing, rales,rhonchi or crepitation. No use of accessory muscles of respiration.  CARDIOVASCULAR: S1, S2 , rhythm was regular. No murmurs, rubs, or gallops.  ABDOMEN: Soft, nontender, nondistended. Bowel sounds present. No organomegaly or mass.  EXTREMITIES: No pedal edema, cyanosis, or clubbing.  NEUROLOGIC: Cranial nerves II through XII are intact.  Muscle strength 5/5 in all extremities. Sensation intact. Gait not checked.  PSYCHIATRIC: The patient is alert and oriented x 3.  SKIN: No obvious rash, lesion, or ulcer.   LABORATORY PANEL:   CBC  Recent Labs Lab 10/10/16 1614 10/13/16 1120 10/16/16 1110  WBC 12.1* 11.7* 13.8*  HGB 11.7* 12.4 13.2  HCT 34.4* 38.4 40.1  PLT 334 361 373  MCV 87.5 89.2 89.5  MCH 29.8 28.9 29.5  MCHC 34.0 32.4 32.9  RDW 14.8 15.1* 15.5*  LYMPHSABS 1.6 2.8  --   MONOABS 0.3 0.7  --   EOSABS 0.0 0.0  --   BASOSABS 0.0 0.1  --    ------------------------------------------------------------------------------------------------------------------  Chemistries   Recent Labs Lab 10/16/16 1110  NA 135  K 3.9  CL 98*  CO2 27  GLUCOSE 145*  BUN 15  CREATININE 1.04*  CALCIUM 8.7*  MG 2.2  AST 40  ALT 29  ALKPHOS 74  BILITOT 0.5   ------------------------------------------------------------------------------------------------------------------  Cardiac Enzymes  Recent Labs Lab 10/13/16 1120 10/13/16 1412 10/16/16 1110  TROPONINI 0.05* 0.06* 0.03*   ------------------------------------------------------------------------------------------------------------------  RADIOLOGY: Dg Chest 2 View  Result Date: 10/16/2016 CLINICAL DATA:  Syncope and tachycardia today. EXAM: CHEST  2 VIEW COMPARISON:  PA and lateral chest 10/10/2016. FINDINGS: There is cardiomegaly without edema. No consolidative process, pneumothorax or effusion. Aortic atherosclerosis noted. No acute bony abnormality. IMPRESSION: No acute disease. Cardiomegaly. Atherosclerosis. Electronically Signed   By: Inge Rise M.D.   On: 10/16/2016 12:18   Ct Renal Stone Study  Result Date: 10/16/2016 CLINICAL DATA:  Right lower quadrant pain EXAM: CT ABDOMEN AND PELVIS WITHOUT CONTRAST TECHNIQUE: Multidetector CT imaging of the abdomen and pelvis was performed following the standard protocol without IV contrast. COMPARISON:   02/20/2014 FINDINGS: Lower chest: Lung bases shows no acute abnormality. Small hiatal hernia. Axial image 1 there is partial visualized subcutaneous nodule in right lower chest wall measures about 1.3 cm. Pathologic lymph node cannot be excluded. Clinical correlation is necessary. Hepatobiliary: Unenhanced liver shows no biliary ductal dilatation. The patient is status post cholecystectomy. Pancreas: Significant atrophic fatty replaced pancreas. Spleen: Normal unenhanced spleen. Adrenals/Urinary Tract: No adrenal gland mass. Bilateral kidneys shows a lobulated contour. Mild bilateral renal cortical thinning probable due to atrophy. No nephrolithiasis. No hydronephrosis or hydroureter. No calcified ureteral calculi are noted. Stable chronic mild distension of the right renal pelvis. Nonspecific mild right perinephric stranding. No calcified calculi are noted within urinary bladder. No thickening of urinary bladder wall. No bladder filling defects are noted. Stomach/Bowel: Study is limited without oral contrast. There is no small bowel obstruction. Abundant stool noted in right colon. No pericecal inflammation. Normal appendix partially visualized in coronal image 68. There is some fecal like material within terminal ileum probable incompetent ileocecal valve. Abundant stool noted within transverse colon. There is some colonic stool in descending colon. Moderate stool noted in rectosigmoid colon. Colonic diverticula are noted proximal sigmoid colon. There is no evidence of acute diverticulitis or colitis. No distal colonic obstruction. Vascular/Lymphatic: There is no adenopathy. Extensive atherosclerotic calcifications of abdominal aorta and iliac arteries. Reproductive: The uterus is atrophic.  No adnexal mass. Other: No ascites or free abdominal air. Nonspecific small bilateral inguinal lymph nodes are noted. Musculoskeletal: No destructive bony lesions are noted. There is dextroscoliosis of the lumbar spine.  Sagittal images of the spine shows osteopenia and degenerative changes thoracolumbar spine. IMPRESSION: 1. Small hiatal hernia is noted. There is nonspecific subcutaneous nodule in right anterolateral chest wall axial image 1 partially visualized measures 1.3 cm. Clinical correlation is necessary to exclude borderline enlarged lymph node. 2. Status post cholecystectomy. Markedly atrophic fatty replaced pancreas. 3. No small bowel obstruction. 4. Abundant stool noted in right colon and transverse colon. No pericecal inflammation. Normal appendix is partially visualized. 5. Moderate stool noted in rectosigmoid colon. Colonic diverticula are noted proximal sigmoid colon. No evidence of acute diverticulitis. 6. No nephrolithiasis. No hydronephrosis or hydroureter. No calcified ureteral calculi are noted. No calcified calculi are noted within urinary bladder. 7. Dextroscoliosis lumbar spine. Degenerative changes thoracolumbar spine. Electronically Signed   By: Lahoma Crocker M.D.   On: 10/16/2016 13:14    EKG: Orders placed or performed during the hospital encounter of 10/16/16  . ED EKG within 10 minutes  . ED EKG within 10 minutes  . EKG 12-Lead  . EKG 12-Lead   EKG in emergency room revealed SVT with short PR. Rate to 194 bpm, normal axis, marked ST-T abnormality in inferior as well as anterolateral leads, repeat EKG revealed ectopic atrial tachycardia, supraventricular bigeminy, repolarization abnormalities  IMPRESSION AND PLAN:  Active Problems:   Other specified cardiac arrhythmias (CODE)  #1. Cardiac tachyarrhythmias, admit patient to medical floor, continue metoprolol,  get cardiologist to see patient in consultation, questionable amiodarone use. Discontinue SNRI Cymbalta, since it can give palpitations and possibly arrhythmias, get TSH #2. Essential hypertension, continue outpatient medications, follow blood pressure readings and advance medications as needed #3. Chronic renal insufficiency,  stable #4. COPD with elevated right-sided pressures, continue torsemide and potassium supplementation #5. Leukocytosis, likely stress related, no obvious infection on antibiotics are going to be given, follow closely #6. Hyperglycemia, get hemoglobin A1c to rule out diabetes  All the records are reviewed and case discussed with ED provider. Management plans discussed with the patient, family and they are in agreement.  CODE STATUS: Code Status History    Date Active Date Inactive Code Status Order ID Comments User Context   09/10/2016  5:07 AM 09/15/2016  6:26 PM Full Code [id number]  Harrie Foreman, MD Inpatient       TOTAL TIME TAKING CARE OF THIS PATIENT: 50 minutes.    Theodoro Grist M.D on 10/16/2016 at 3:09 PM  Between 7am to 6pm - Pager - [phone] After 6pm go to [email or web address] - password [email or web address] [facility]  Office  [phone]  CC: Primary care physician; Kirk Ruths., MD

## 2016-10-16 NOTE — ED Provider Notes (Signed)
[facility] Emergency Department Provider Note  ____________________________________________  Time seen: Approximately 11:42 AM  I have reviewed the triage vital signs and the nursing notes.   HISTORY  Chief Complaint Tachycardia   HPI Tammie Mcmahon is a 83 y.o. female with a h/o heart failure, atrial fibrillation and flutter on Coumadin and metoprolol, CK D, diabetes, hypertension, hyperlipidemia, pernicious anemia who presents from cardiology office for tachycardia. Patient was seen here 3 days ago for similar. She followed up with Dr. Nehemiah Massed today at his clinic however was sent to the ED for HR in the 160s. Patient denies chest pain, SOB, dizziness, N/V at this time. Patient reports while lower quadrant abdominal pain that has been going on for a week in the setting of constipation. She reports that she hasn't had a bowel movement in over a week. She takes Dulcolax every day and has been taking MiraLAX intermittently. Patient thinks that all of her heart problems are caused because of her abdominal pain. She denies any prior abdominal surgeries. She denies nausea or vomiting, dysuria or hematuria. She currently endorses severe sharp right lower quadrant abdominal pain that has been constant for a week, nonradiating.  Past Medical History:  Diagnosis Date  . (HFpEF) heart failure with preserved ejection fraction ([place])   . Atrial fibrillation and flutter ([place])   . Breast cancer ([place])   . CKD (chronic kidney disease), stage III   . Diabetes mellitus without complication ([place])   . Hyperlipidemia   . Hypertension   . Low back pain   . Menopausal and postmenopausal disorder   . PA (pernicious anemia)   . Thyroid disease   . Valvular heart disease     Patient Active Problem List   Diagnosis Date Noted  . Confusion 09/10/2016    Past Surgical History:  Procedure Laterality Date  . BREAST SURGERY    . GALLBLADDER SURGERY    . MASTECTOMY Left     . THYROIDECTOMY      Prior to Admission medications   Medication Sig Start Date End Date Taking? Authorizing Provider  cholecalciferol (VITAMIN D) 1000 units tablet Take 1,000 Units by mouth daily.   Yes Historical Provider, MD  ciprofloxacin (CIPRO) 500 MG tablet Take 1 tablet by mouth 2 (two) times daily. For 7 days 10/02/16  Yes Historical Provider, MD  DULoxetine (CYMBALTA) 60 MG capsule Take 60 mg by mouth daily. take 1 capsule by mouth once daily   Yes Historical Provider, MD  levothyroxine (SYNTHROID) 50 MCG tablet Take 1 tablet (50 mcg total) by mouth daily before breakfast. 09/15/16  Yes Epifanio Lesches, MD  metoprolol succinate (TOPROL-XL) 100 MG 24 hr tablet Take 100 mg by mouth daily. 09/18/16  Yes Historical Provider, MD  omeprazole (PRILOSEC) 20 MG capsule Take 20 mg by mouth daily.   Yes Historical Provider, MD  pravastatin (PRAVACHOL) 40 MG tablet take 1 tablet by mouth EVERY NIGHT 05/19/14  Yes Historical Provider, MD  torsemide (DEMADEX) 20 MG tablet Take 40 mg by mouth daily.   Yes Historical Provider, MD  warfarin (COUMADIN) 1 MG tablet Take 1 mg by mouth as directed. Taking 3mg  daily: 1mg  tablet and 2mg  tablet; per RN at Dr. Tonette Bihari office   Yes Historical Provider, MD  warfarin (COUMADIN) 2 MG tablet Take 2 mg by mouth daily. Taking 3mg  daily: 2mg  tablet and 1mg  tablet per RN at Dr. Tonette Bihari office   Yes Historical Provider, MD  azelastine (ASTELIN) 0.1 % nasal spray Place  1 spray into both nostrils daily as needed. 09/30/16   Historical Provider, MD  benzonatate (TESSALON) 200 MG capsule Take 1 capsule by mouth 3 (three) times daily as needed. 09/30/16   Historical Provider, MD  diclofenac sodium (VOLTAREN) 1 % GEL Apply topically 4 (four) times daily.    Historical Provider, MD  potassium chloride (MICRO-K) 10 MEQ CR capsule Take 10 mEq by mouth 2 (two) times daily. take 1 capsule by mouth twice a day 04/10/14   Historical Provider, MD    Allergies Patient has no  known allergies.  Family History  Problem Relation Age of Onset  . Heart attack Father     Social History Social History  Substance Use Topics  . Smoking status: Never Smoker  . Smokeless tobacco: Current User    Types: Snuff  . Alcohol use No    Review of Systems  Constitutional: Negative for fever. Eyes: Negative for visual changes. ENT: Negative for sore throat. Neck: No neck pain  Cardiovascular: Negative for chest pain. + palpitations Respiratory: Negative for shortness of breath. Gastrointestinal: + RLQ abdominal pain and constipation. No vomiting or diarrhea. Genitourinary: Negative for dysuria. Musculoskeletal: Negative for back pain. Skin: Negative for rash. Neurological: Negative for headaches, weakness or numbness. Psych: No SI or HI  ____________________________________________   PHYSICAL EXAM:  VITAL SIGNS: ED Triage Vitals  Enc Vitals Group     BP 10/16/16 1108 (!) 169/101     Pulse Rate 10/16/16 1108 (!) 129     Resp 10/16/16 1108 16     Temp 10/16/16 1116 98.1 F (36.7 C)     Temp Source 10/16/16 1116 Oral     SpO2 10/16/16 1116 95 %     Weight --      Height --      Head Circumference --      Peak Flow --      Pain Score 10/16/16 1109 0     Pain Loc --      Pain Edu? --      Excl. in [place]? --     Constitutional: Alert and oriented. Well appearing and in no apparent distress. HEENT:      Head: Normocephalic and atraumatic.         Eyes: Conjunctivae are normal. Sclera is non-icteric. EOMI. PERRL      Mouth/Throat: Mucous membranes are moist.       Neck: Supple with no signs of meningismus. Cardiovascular: Regular rhythm tachycardic rate. No murmurs, gallops, or rubs. 2+ symmetrical distal pulses are present in all extremities. No JVD. Respiratory: Normal respiratory effort. Lungs are clear to auscultation bilaterally. No wheezes, crackles, or rhonchi.  Gastrointestinal: Soft, mild ttp over the RLQ, large reducible abdominal wall hernia.  non distended with positive bowel sounds. No rebound or guarding. Musculoskeletal: Nontender with normal range of motion in all extremities. No edema, cyanosis, or erythema of extremities. Neurologic: Normal speech and language. Face is symmetric. Moving all extremities. No gross focal neurologic deficits are appreciated. Skin: Skin is warm, dry and intact. No rash noted. Psychiatric: Mood and affect are normal. Speech and behavior are normal.  ____________________________________________   LABS (all labs ordered are listed, but only abnormal results are displayed)  Labs Reviewed  BASIC METABOLIC PANEL - Abnormal; Notable for the following:       Result Value   Chloride 98 (*)    Glucose, Bld 145 (*)    Creatinine, Ser 1.04 (*)    Calcium 8.7 (*)  GFR calc non Af Amer 47 (*)    GFR calc Af Amer 55 (*)    All other components within normal limits  CBC - Abnormal; Notable for the following:    WBC 13.8 (*)    RDW 15.5 (*)    All other components within normal limits  TROPONIN I - Abnormal; Notable for the following:    Troponin I 0.03 (*)    All other components within normal limits  PROTIME-INR - Abnormal; Notable for the following:    Prothrombin Time 19.1 (*)    All other components within normal limits  HEPATIC FUNCTION PANEL - Abnormal; Notable for the following:    Bilirubin, Direct <0.1 (*)    All other components within normal limits  TSH - Abnormal; Notable for the following:    TSH 9.448 (*)    All other components within normal limits  LIPASE, BLOOD  MAGNESIUM  T4, FREE   ____________________________________________  EKG  ED ECG REPORT I, Rudene Re, the attending physician, personally viewed and interpreted this ECG.  10:28 - supraventricular tachycardia, rate of 194, normal intervals, normal axis, diffuse ST depressions on inferior and lateral leads with STE in aVR   11:46 - supraventricular bigeminy with rate of 133, normal intervals, improved ST  depressions on inferior and lateral leads, no ST elevation ____________________________________________  RADIOLOGY  CXR: No acute disease. Cardiomegaly. Atherosclerosis.  CT a/p: 1. Small hiatal hernia is noted. There is nonspecific subcutaneous nodule in right anterolateral chest wall axial image 1 partially visualized measures 1.3 cm. Clinical correlation is necessary to exclude borderline enlarged lymph node. 2. Status post cholecystectomy. Markedly atrophic fatty replaced pancreas. 3. No small bowel obstruction. 4. Abundant stool noted in right colon and transverse colon. No pericecal inflammation. Normal appendix is partially visualized. 5. Moderate stool noted in rectosigmoid colon. Colonic diverticula are noted proximal sigmoid colon. No evidence of acute diverticulitis. 6. No nephrolithiasis. No hydronephrosis or hydroureter. No calcified ureteral calculi are noted. No calcified calculi are noted within urinary bladder. 7. Dextroscoliosis lumbar spine. Degenerative changes thoracolumbar spine. ____________________________________________   PROCEDURES  Procedure(s) performed: None Procedures Critical Care performed:  None ____________________________________________   INITIAL IMPRESSION / ASSESSMENT AND PLAN / ED COURSE  83 y.o. female with a h/o heart failure, atrial fibrillation and flutter on Coumadin and metoprolol, CK D, diabetes, hypertension, hyperlipidemia, pernicious anemia who presents from cardiology office for tachycardia. Patient with multiple different rhythms on EKG initially an SVT, converted without intervention to normal sinus rhythm and it's now on a supraventricular bigeminy. Initial EKG patient was tachycardic to the 190s with significant ST depressions in inferior lateral leads and ST elevations on aVR and V1 which have now resolved. Patient is also complaining of abdominal pain for a week in the right lower quadrant the setting of constipation.  Patient has tenderness in that area concerning for possible constipation versus diverticulitis versus appendicitis. We'll pursue a CT scan of her abdomen. We'll discuss management of her arrhythmia with Dr. Nehemiah Massed as she was sent here by him.   Clinical Course as of Oct 16 1349  Thu Oct 16, 2016  1349 Patient received 5 mg of IV metoprolol and her dose of Lopressor and she is now in normal sinus rhythm with a rate in the 90s. Her first troponin 0.03. Her INR is subtherapeutic at 1.59. Her CT abdomen and pelvis showing normal appendix with no other acute findings other than constipation to explain her abdominal pain. I discussed patient with Dr.  Nehemiah Massed who recommended admitting her for further management of her arrhythmia and he will see and manage her in house. Patient will be admitted to the Hospitalist service.  [CV]    Clinical Course User Index [CV] Rudene Re, MD    Pertinent labs & imaging results that were available during my care of the patient were reviewed by me and considered in my medical decision making (see chart for details).    ____________________________________________   FINAL CLINICAL IMPRESSION(S) / ED DIAGNOSES  Final diagnoses:  SVT (supraventricular tachycardia) (HCC)  Atrial fibrillation with RVR (HCC)  Constipation, unspecified constipation type      NEW MEDICATIONS STARTED DURING THIS VISIT:  New Prescriptions   No medications on file     Note:  This document was prepared using Dragon voice recognition software and may include unintentional dictation errors.    Rudene Re, MD 10/16/16 1352

## 2016-10-16 NOTE — ED Triage Notes (Signed)
Pt to ED from Dr. Huey Romans office, pt was d/c from ED Monday with syncopal episode and rapid tachycardia. Per pt , Dr. Paris Lore sent pt here for HR 190s. Pt denies any CP or SOB, c/o weakness this week since last scene. Pt A&Ox4. HR currently

## 2016-10-16 NOTE — Progress Notes (Signed)
ANTICOAGULATION CONSULT NOTE - Initial Consult  Pharmacy Consult for Warfarin Indication: atrial fibrillation  No Known Allergies  Patient Measurements:    Vital Signs: Temp: 98.1 F (36.7 C) (01/11 1116) Temp Source: Oral (01/11 1116) BP: 156/96 (01/11 1500) Pulse Rate: 90 (01/11 1500)  Labs:  Recent Labs  10/16/16 1110  HGB 13.2  HCT 40.1  PLT 373  LABPROT 19.1*  INR 1.59  CREATININE 1.04*  TROPONINI 0.03*    Estimated Creatinine Clearance: 40.7 mL/min (by C-G formula based on SCr of 1.04 mg/dL (H)).   Medical History: Past Medical History:  Diagnosis Date  . (HFpEF) heart failure with preserved ejection fraction ([place])   . Atrial fibrillation and flutter ([place])   . Breast cancer ([place])   . CKD (chronic kidney disease), stage III   . Diabetes mellitus without complication ([place])   . Hyperlipidemia   . Hypertension   . Low back pain   . Menopausal and postmenopausal disorder   . PA (pernicious anemia)   . Thyroid disease   . Valvular heart disease     Medications:   (Not in a hospital admission)  Assessment: 83 y/o F with a h/o CHF and AF on warfarin 3 mg daily PTA admitted with SVT and subtherapeutic INR.   Goal of Therapy:  INR 2-3   Plan:  Warfarin 5 mg once then 3 mg daily. Will f/u AM INR.   Ulice Dash D 10/16/2016,4:37 PM

## 2016-10-17 DIAGNOSIS — I471 Supraventricular tachycardia: Secondary | ICD-10-CM | POA: Diagnosis not present

## 2016-10-17 LAB — CBC
HCT: 36.4 % (ref 35.0–47.0)
Hemoglobin: 12.3 g/dL (ref 12.0–16.0)
MCH: 29.8 pg (ref 26.0–34.0)
MCHC: 33.6 g/dL (ref 32.0–36.0)
MCV: 88.5 fL (ref 80.0–100.0)
Platelets: 328 10*3/uL (ref 150–440)
RBC: 4.12 MIL/uL (ref 3.80–5.20)
RDW: 15.3 % — ABNORMAL HIGH (ref 11.5–14.5)
WBC: 10.6 10*3/uL (ref 3.6–11.0)

## 2016-10-17 LAB — BASIC METABOLIC PANEL
Anion gap: 9 (ref 5–15)
BUN: 14 mg/dL (ref 6–20)
CHLORIDE: 101 mmol/L (ref 101–111)
CO2: 28 mmol/L (ref 22–32)
Calcium: 7.9 mg/dL — ABNORMAL LOW (ref 8.9–10.3)
Creatinine, Ser: 0.89 mg/dL (ref 0.44–1.00)
GFR calc Af Amer: >60 mL/min (ref >60)
GFR calc non Af Amer: 57 mL/min — ABNORMAL LOW (ref >60)
GLUCOSE: 114 mg/dL — AB (ref 65–99)
POTASSIUM: 3.4 mmol/L — AB (ref 3.5–5.1)
Sodium: 138 mmol/L (ref 135–145)

## 2016-10-17 LAB — PROTIME-INR
INR: 1.42
Prothrombin Time: 17.5 seconds — ABNORMAL HIGH (ref 11.4–15.2)

## 2016-10-17 LAB — TROPONIN I: Troponin I: 0.04 ng/mL (ref <0.03)

## 2016-10-17 MED ORDER — SENNOSIDES-DOCUSATE SODIUM 8.6-50 MG PO TABS
1.0000 | ORAL_TABLET | Freq: Two times a day (BID) | ORAL | Status: DC
  Administered 2016-10-17 – 2016-10-18 (×3): 1 via ORAL
  Filled 2016-10-17 (×3): qty 1

## 2016-10-17 MED ORDER — BISACODYL 10 MG RE SUPP: 10.0000 mg | Freq: Every day | RECTAL | Status: DC

## 2016-10-17 MED ORDER — POLYETHYLENE GLYCOL 3350 17 G PO PACK
17.0000 g | PACK | Freq: Every day | ORAL | Status: DC
  Administered 2016-10-17 – 2016-10-18 (×2): 17 g via ORAL
  Filled 2016-10-17 (×2): qty 1

## 2016-10-17 MED ORDER — AMIODARONE HCL 200 MG PO TABS
400.0000 mg | ORAL_TABLET | Freq: Two times a day (BID) | ORAL | Status: DC
  Administered 2016-10-17 – 2016-10-18 (×2): 400 mg via ORAL
  Filled 2016-10-17 (×2): qty 2

## 2016-10-17 NOTE — Progress Notes (Signed)
Per Dr. Benjie Karvonen,  [place] discharge.

## 2016-10-17 NOTE — Care Management (Signed)
Patient placed in observation for episode of tachycardia.  She presents from home and is followed by [facility]. Says is being seen by nurse and physical therapy.  Notified [facility] of anticipated discharge today and agency to resume her SN and PT

## 2016-10-17 NOTE — Progress Notes (Addendum)
Rn noticed that patient had not urinated throughout the entire night. Patient helped to [facility] where she urinated 250 mL. Patient states that she felt that she still needed to go to the bathroom. RN palpated bladder which felt slightly distended. Bladder scan performed, 652 mL of urine in bladder. MD Marcille Blanco notified. Verbal order given for intermittent catheter. Procedure explained to patient, and patient agreeably.   Update: Two RN's unsuccessful in attempt to catheterize patient. Had patient sit on bedside commode again, pt was able to urinate 400 mL. Patient states that she feels better. Will continue to monitor.   Iran Sizer M

## 2016-10-17 NOTE — Clinical Social Work Note (Signed)
CSW received referral for SNF.  Case discussed with case manager and plan is to discharge home with home health.  CSW to sign off please re-consult if social work needs arise.  Kateria Cutrona R. Mkayla Steele, MSW, LCSWA [phone]   

## 2016-10-17 NOTE — Progress Notes (Signed)
Dr. Nehemiah Massed rounding now, aware that patient just went into irregular rhythm with elevated heart rate per CCMD. MD to assess and place orders if needed.

## 2016-10-17 NOTE — Consult Note (Signed)
[facility] Card[facility] Consultation Note  Patient ID: Tammie Mcmahon, MRN: [id number], DOB/AGE: [birth year] 83 y.o. Admit date: 10/16/2016   Date of Consult: 10/17/2016 Primary Physician: Kirk Ruths., MD Primary Cardiologist: Nehemiah Massed  Chief Complaint:  Chief Complaint  Patient presents with  . Tachycardia   Reason for Consult: atrial fibrillation with rapid ventricular rate  HPI: 83 y.o. female with known diastolic dysfunction heart failure essential hypertension mixed hyperlipidemia and paroxysmal nonvalvular atrial fibrillation who has had worsening bradycardia due to appropriate treatment of atrial fibrillation. The patient had to have a decrease in dosage of metoprolol and diltiazem to improve symptomatic bradycardia. At that time the patient has had worsening symptoms over the last several weeks of intermittent atrial fibrillation with rapid ventricular rate. She has been seen in the emergency room multiple times or which she was given intravenous beta blocker or intravenous Cardizem for which he converted her to normal sinus rhythm. The patient then went home and had further recurrence. Patient has been on appropriate warfarin use for further risk reduction in stroke with atrial fibrillation with no current evidence of bleeding complications. The patient additionally has had no evidence of LV systolic dysfunction but has had acute on chronic diastolic dysfunction heart failure due to issues listed above exacerbated by chronic kidney disease and diabetes. The patient currently was more and more symptomatic and was admitted to the hospital for her above. After further injection the patient had normal sinus rhythm and has been in normal sinus rhythm with only a few episodes of rapid ventricular rate. We have discussed at length with the patient will likely need further intervention including atrial fibrillation rhythm management with amiodarone. She understands risk and benefits  of treatment listed above  Past Medical History:  Diagnosis Date  . (HFpEF) heart failure with preserved ejection fraction ([place])   . Atrial fibrillation and flutter ([place])   . Breast cancer ([place])   . CKD (chronic kidney disease), stage III   . Diabetes mellitus without complication ([place])   . Hyperlipidemia   . Hypertension   . Low back pain   . Menopausal and postmenopausal disorder   . PA (pernicious anemia)   . Thyroid disease   . Valvular heart disease       Surgical History:  Past Surgical History:  Procedure Laterality Date  . BREAST SURGERY    . GALLBLADDER SURGERY    . MASTECTOMY Left   . THYROIDECTOMY       Home Meds: Prior to Admission medications   Medication Sig Start Date End Date Taking? Authorizing Provider  cholecalciferol (VITAMIN D) 1000 units tablet Take 1,000 Units by mouth daily.   Yes Historical Provider, MD  ciprofloxacin (CIPRO) 500 MG tablet Take 1 tablet by mouth 2 (two) times daily. For 7 days 10/02/16  Yes Historical Provider, MD  DULoxetine (CYMBALTA) 60 MG capsule Take 60 mg by mouth daily. take 1 capsule by mouth once daily   Yes Historical Provider, MD  levothyroxine (SYNTHROID) 50 MCG tablet Take 1 tablet (50 mcg total) by mouth daily before breakfast. 09/15/16  Yes Epifanio Lesches, MD  metoprolol succinate (TOPROL-XL) 100 MG 24 hr tablet Take 100 mg by mouth daily. 09/18/16  Yes Historical Provider, MD  omeprazole (PRILOSEC) 20 MG capsule Take 20 mg by mouth daily.   Yes Historical Provider, MD  pravastatin (PRAVACHOL) 40 MG tablet take 1 tablet by mouth EVERY NIGHT 05/19/14  Yes Historical Provider, MD  torsemide (DEMADEX) 20 MG tablet Take 40 mg  by mouth daily.   Yes Historical Provider, MD  warfarin (COUMADIN) 1 MG tablet Take 1 mg by mouth as directed. Taking 3mg  daily: 1mg  tablet and 2mg  tablet; per RN at Dr. Tonette Bihari office   Yes Historical Provider, MD  warfarin (COUMADIN) 2 MG tablet Take 2 mg by mouth daily. Taking 3mg  daily: 2mg   tablet and 1mg  tablet per RN at Dr. Tonette Bihari office   Yes Historical Provider, MD  azelastine (ASTELIN) 0.1 % nasal spray Place 1 spray into both nostrils daily as needed. 09/30/16   Historical Provider, MD  benzonatate (TESSALON) 200 MG capsule Take 1 capsule by mouth 3 (three) times daily as needed. 09/30/16   Historical Provider, MD  diclofenac sodium (VOLTAREN) 1 % GEL Apply topically 4 (four) times daily.    Historical Provider, MD  potassium chloride (MICRO-K) 10 MEQ CR capsule Take 10 mEq by mouth 2 (two) times daily. take 1 capsule by mouth twice a day 04/10/14   Historical Provider, MD    Inpatient Medications:  . amiodarone  400 mg Oral BID  . aspirin EC  81 mg Oral Daily  . bisacodyl  10 mg Rectal Daily  . diclofenac sodium  2 g Topical QID  . levothyroxine  50 mcg Oral QAC breakfast  . metoprolol succinate  100 mg Oral Daily  . pantoprazole  40 mg Oral Daily  . polyethylene glycol  17 g Oral Daily  . potassium chloride  10 mEq Oral Daily  . senna-docusate  1 tablet Oral BID  . sodium chloride flush  3 mL Intravenous Q12H  . sodium chloride flush  3 mL Intravenous Q12H  . torsemide  40 mg Oral Daily  . warfarin  3 mg Oral q1800  . Warfarin - Pharmacist Dosing Inpatient   Does not apply q1800     Allergies: No Known Allergies  Social History   Social History  . Marital status: Widowed    Spouse name: N/A  . Number of children: N/A  . Years of education: N/A   Occupational History  . Not on file.   Social History Main Topics  . Smoking status: Never Smoker  . Smokeless tobacco: Current User    Types: Snuff  . Alcohol use No  . Drug use: No  . Sexual activity: Not on file   Other Topics Concern  . Not on file   Social History Narrative  . No narrative on file     Family History  Problem Relation Age of Onset  . Heart attack Father      Review of Systems Positive for Shortness of breath Negative for: General:  chills, fever, night sweats or weight  changes.  Cardiovascular: PND orthopnea syncope dizziness  Dermatological skin lesions rashes Respiratory: Cough congestion Urologic: Frequent urination urination at night and hematuria Abdominal: negative for nausea, vomiting, diarrhea, bright red blood per rectum, melena, or hematemesis Neurologic: negative for visual changes, and/or hearing changes  All other systems reviewed and are otherwise negative except as noted above.  Labs:  Recent Labs  10/16/16 1110 10/16/16 1635 10/16/16 1933 10/17/16 0137  TROPONINI 0.03* 0.04* 0.03* 0.04*   Lab Results  Component Value Date   WBC 10.6 10/17/2016   HGB 12.3 10/17/2016   HCT 36.4 10/17/2016   MCV 88.5 10/17/2016   PLT 328 10/17/2016    Recent Labs Lab 10/16/16 1110  10/17/16 0453  NA 135  --  138  K 3.9  --  3.4*  CL 98*  --  101  CO2 27  --  28  BUN 15  --  14  CREATININE 1.04*  < > 0.89  CALCIUM 8.7*  --  7.9*  PROT 6.9  --   --   BILITOT 0.5  --   --   ALKPHOS 74  --   --   ALT 29  --   --   AST 40  --   --   GLUCOSE 145*  --  114*  < > = values in this interval not displayed. No results found for: CHOL, HDL, LDLCALC, TRIG Lab Results  Component Value Date   DDIMER 0.36 10/10/2016    Radiology/Studies:  Dg Chest 2 View  Result Date: 10/16/2016 CLINICAL DATA:  Syncope and tachycardia today. EXAM: CHEST  2 VIEW COMPARISON:  PA and lateral chest 10/10/2016. FINDINGS: There is cardiomegaly without edema. No consolidative process, pneumothorax or effusion. Aortic atherosclerosis noted. No acute bony abnormality. IMPRESSION: No acute disease. Cardiomegaly. Atherosclerosis. Electronically Signed   By: Inge Rise M.D.   On: 10/16/2016 12:18   Dg Chest 2 View  Result Date: 10/10/2016 CLINICAL DATA:  Atrial fibrillation symptoms beginning today. Chest congestion. EXAM: CHEST  2 VIEW COMPARISON:  Single-view of the chest 10/06/2016 and 09/09/2016. FINDINGS: There is cardiomegaly without edema. No consolidative  process, pneumothorax or effusion. Aortic atherosclerosis is noted. Thoracolumbar scoliosis is seen. IMPRESSION: Cardiomegaly without acute disease. Atherosclerosis. Electronically Signed   By: Inge Rise M.D.   On: 10/10/2016 18:11   Ct Head Wo Contrast  Result Date: 10/06/2016 CLINICAL DATA:  Weakness. Feeling unwell since 1600 hours today. History of atrial fibrillation, breast cancer, diabetes, hypertension and hyperlipidemia. EXAM: CT HEAD WITHOUT CONTRAST TECHNIQUE: Contiguous axial images were obtained from the base of the skull through the vertex without intravenous contrast. COMPARISON:  MRI head September 10, 2016 FINDINGS: BRAIN: The ventricles and sulci are normal for age. No intraparenchymal hemorrhage, mass effect nor midline shift. Patchy supratentorial white matter hypodensities less than expected for patient's age less than expected, though non-specific are most compatible with chronic small vessel ischemic disease. Symmetric basal ganglia mineralization. No acute large vascular territory infarcts. No abnormal extra-axial fluid collections. Basal cisterns are patent. VASCULAR: Moderate calcific atherosclerosis of the carotid siphons. SKULL: No skull fracture. No significant scalp soft tissue swelling. 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 SINUSES/ORBITS: The mastoid air-cells and included paranasal sinuses are well-aerated. Status post bilateral ocular lens implants. The included ocular globes and orbital contents are non-suspicious. OTHER: Patient is edentulous. IMPRESSION: Negative CT HEAD for age. Electronically Signed   By: Elon Alas M.D.   On: 10/06/2016 22:51   Dg Chest Port 1 View  Result Date: 10/06/2016 CLINICAL DATA:  Weakness today. History of breast cancer, diabetes, heart failure. EXAM: PORTABLE CHEST 1 VIEW COMPARISON:  Chest radiograph September 09, 2016 FINDINGS: Cardiac silhouette is mildly enlarged an unchanged. Mediastinal silhouette is  nonsuspicious. Chronic interstitial changes without pleural effusion or focal consolidation. No pneumothorax. Mild biapical pleural thickening. Osteopenia. Lower thoracic levoscoliosis. Soft tissue planes are nonsuspicious. IMPRESSION: Mild cardiomegaly and chronic interstitial changes. Electronically Signed   By: Elon Alas M.D.   On: 10/06/2016 22:54   Dg Abdomen Acute W/chest  Result Date: 10/13/2016 CLINICAL DATA:  Abdominal pain swollen shortness of breath. EXAM: DG ABDOMEN ACUTE W/ 1V CHEST COMPARISON:  Chest x-ray of October 10, 2014 FINDINGS: The lungs are adequately inflated. The interstitial markings are coarse and slightly more conspicuous today. The cardiac silhouette remains enlarged.  The pulmonary vascularity is mildly prominent centrally. There is calcification in the wall of the aortic arch. There is no pleural effusion. Within the abdomen the colonic stool burden is moderate. There is a small amount of small bowel gas within normal caliber small bowel loops. There are surgical clips in the gallbladder fossa. There is a moderate amount of gas within the stomach and there is gas and stool in the rectum. There is mild dextrocurvature of the lumbar spine. There is degenerative disc disease at multiple lumbar levels. IMPRESSION: Chronic bronchitic changes bilaterally. Mild cardiomegaly with central pulmonary vascular prominence may reflect low-grade compensated CHF. Thoracic aortic atherosclerosis. Moderately increase colonic stool burden may reflect constipation in the appropriate clinical setting. Small amount of gas within normal caliber small bowel loops is likely normal. There is no evidence of obstruction or perforation. Electronically Signed   By: David  Martinique M.D.   On: 10/13/2016 12:30   Ct Renal Stone Study  Result Date: 10/16/2016 CLINICAL DATA:  Right lower quadrant pain EXAM: CT ABDOMEN AND PELVIS WITHOUT CONTRAST TECHNIQUE: Multidetector CT imaging of the abdomen and pelvis  was performed following the standard protocol without IV contrast. COMPARISON:  02/20/2014 FINDINGS: Lower chest: Lung bases shows no acute abnormality. Small hiatal hernia. Axial image 1 there is partial visualized subcutaneous nodule in right lower chest wall measures about 1.3 cm. Pathologic lymph node cannot be excluded. Clinical correlation is necessary. Hepatobiliary: Unenhanced liver shows no biliary ductal dilatation. The patient is status post cholecystectomy. Pancreas: Significant atrophic fatty replaced pancreas. Spleen: Normal unenhanced spleen. Adrenals/Urinary Tract: No adrenal gland mass. Bilateral kidneys shows a lobulated contour. Mild bilateral renal cortical thinning probable due to atrophy. No nephrolithiasis. No hydronephrosis or hydroureter. No calcified ureteral calculi are noted. Stable chronic mild distension of the right renal pelvis. Nonspecific mild right perinephric stranding. No calcified calculi are noted within urinary bladder. No thickening of urinary bladder wall. No bladder filling defects are noted. Stomach/Bowel: Study is limited without oral contrast. There is no small bowel obstruction. Abundant stool noted in right colon. No pericecal inflammation. Normal appendix partially visualized in coronal image 68. There is some fecal like material within terminal ileum probable incompetent ileocecal valve. Abundant stool noted within transverse colon. There is some colonic stool in descending colon. Moderate stool noted in rectosigmoid colon. Colonic diverticula are noted proximal sigmoid colon. There is no evidence of acute diverticulitis or colitis. No distal colonic obstruction. Vascular/Lymphatic: There is no adenopathy. Extensive atherosclerotic calcifications of abdominal aorta and iliac arteries. Reproductive: The uterus is atrophic.  No adnexal mass. Other: No ascites or free abdominal air. Nonspecific small bilateral inguinal lymph nodes are noted. Musculoskeletal: No  destructive bony lesions are noted. There is dextroscoliosis of the lumbar spine. Sagittal images of the spine shows osteopenia and degenerative changes thoracolumbar spine. IMPRESSION: 1. Small hiatal hernia is noted. There is nonspecific subcutaneous nodule in right anterolateral chest wall axial image 1 partially visualized measures 1.3 cm. Clinical correlation is necessary to exclude borderline enlarged lymph node. 2. Status post cholecystectomy. Markedly atrophic fatty replaced pancreas. 3. No small bowel obstruction. 4. Abundant stool noted in right colon and transverse colon. No pericecal inflammation. Normal appendix is partially visualized. 5. Moderate stool noted in rectosigmoid colon. Colonic diverticula are noted proximal sigmoid colon. No evidence of acute diverticulitis. 6. No nephrolithiasis. No hydronephrosis or hydroureter. No calcified ureteral calculi are noted. No calcified calculi are noted within urinary bladder. 7. Dextroscoliosis lumbar spine. Degenerative changes thoracolumbar spine. Electronically Signed  By: Lahoma Crocker M.D.   On: 10/16/2016 13:14    EKG: Atrial fibrillation with rapid ventricular rate  Weights: Filed Weights   10/16/16 1939  Weight: 74.8 kg (164 lb 14.4 oz)     Physical Exam: Blood pressure 140/79, pulse 91, temperature 98.3 F (36.8 C), temperature source Oral, resp. rate 19, height 5\' 5"  (1.651 m), weight 74.8 kg (164 lb 14.4 oz), SpO2 96 %. Body mass index is 27.44 kg/m. General: Well developed, well nourished, in no acute distress. Head eyes ears nose throat: Normocephalic, atraumatic, sclera non-icteric, no xanthomas, nares are without discharge. No apparent thyromegaly and/or mass  Lungs: Normal respiratory effort.  no wheezes, Few rales, no rhonchi.  Heart: Irregular with normal S1 S2. no murmur gallop, no rub, PMI is normal size and placement, carotid upstroke normal , jugular venous pressure is normal Abdomen: Soft, non-tender, non-distended  with normoactive bowel sounds. No hepatomegaly. No rebound/guarding. No obvious abdominal masses. Abdominal aorta is normal size without bruit Extremities: Trace to 1+ edema. no cyanosis, no clubbing, no ulcers  Peripheral : 2+ bilateral upper extremity pulses, 2+ bilateral femoral pulses, 2+ bilateral dorsal pedal pulse Neuro: Alert and oriented. No facial asymmetry. No focal deficit. Moves all extremities spontaneously. Musculoskeletal: Normal muscle tone without kyphosis Psych:  Responds to questions appropriately with a normal affect.    Assessment: 83 year old female with acute on chronic diastolic dysfunction heart failure likely secondary to atrial fibrillation with rapid ventricular rate paroxysmal nonvalvular in nature without current evidence of myocardial infarction  Plan: 1. Continue metoprolol and diltiazem for heart rate control of atrial fibrillation 2. Amiodarone 400 mg twice per day until better control and maintenance of normal sinus rhythm 3. Continue anticoagulation for further risk reduction in stroke with atrial fibrillation with a goal INR between 2 and 3 4. Begin ambulation following for further adjustments of medication management 5. No further cardiac diagnostics necessary at this time 6. Possible discharge to home tomorrow if patient relatively improved with appropriate medication management listed above  Signed, Corey Skains M.D. [facility] 10/17/2016, 5:56 PM

## 2016-10-17 NOTE — Progress Notes (Signed)
[facility] [place] NAME: Tammie Mcmahon    MR#:  [id number]  DATE OF BIRTH:  06-Sep-1928  SUBJECTIVE:  Doing well in NSR  REVIEW OF SYSTEMS:    Review of Systems  Constitutional: Negative.  Negative for chills, fever and malaise/fatigue.  HENT: Negative.  Negative for ear discharge, ear pain, hearing loss, nosebleeds and sore throat.   Eyes: Negative.  Negative for blurred vision and pain.  Respiratory: Negative.  Negative for cough, hemoptysis, shortness of breath and wheezing.   Cardiovascular: Negative.  Negative for chest pain, palpitations and leg swelling.  Gastrointestinal: Negative.  Negative for abdominal pain, blood in stool, diarrhea, nausea and vomiting.  Genitourinary: Negative.  Negative for dysuria.  Musculoskeletal: Negative.  Negative for back pain.  Skin: Negative.   Neurological: Negative for dizziness, tremors, speech change, focal weakness, seizures and headaches.  Endo/Heme/Allergies: Negative.  Does not bruise/bleed easily.  Psychiatric/Behavioral: Negative.  Negative for depression, hallucinations and suicidal ideas.    Tolerating Diet:yes      DRUG ALLERGIES:  No Known Allergies  VITALS:  Blood pressure 138/79, pulse 89, temperature 98.3 F (36.8 C), temperature source Oral, resp. rate 19, height 5\' 5"  (1.651 m), weight 74.8 kg (164 lb 14.4 oz), SpO2 96 %.  PHYSICAL EXAMINATION:   Physical Exam  Constitutional: She is oriented to person, place, and time and well-developed, well-nourished, and in no distress. No distress.  HENT:  Head: Normocephalic.  Eyes: No scleral icterus.  Neck: Normal range of motion. Neck supple. No JVD present. No tracheal deviation present.  Cardiovascular: Normal rate, regular rhythm and normal heart sounds.  Exam reveals no gallop and no friction rub.   No murmur heard. Pulmonary/Chest: Effort normal and breath sounds normal. No respiratory distress. She has no wheezes. She  has no rales. She exhibits no tenderness.  Abdominal: Soft. Bowel sounds are normal. She exhibits no distension and no mass. There is no tenderness. There is no rebound and no guarding.  Musculoskeletal: Normal range of motion. She exhibits no edema.  Neurological: She is alert and oriented to person, place, and time.  Skin: Skin is warm. No rash noted. No erythema.  Psychiatric: Affect and judgment normal.      LABORATORY PANEL:   CBC  Recent Labs Lab 10/17/16 0453  WBC 10.6  HGB 12.3  HCT 36.4  PLT 328   ------------------------------------------------------------------------------------------------------------------  Chemistries   Recent Labs Lab 10/16/16 1110  10/17/16 0453  NA 135  --  138  K 3.9  --  3.4*  CL 98*  --  101  CO2 27  --  28  GLUCOSE 145*  --  114*  BUN 15  --  14  CREATININE 1.04*  < > 0.89  CALCIUM 8.7*  --  7.9*  MG 2.2  --   --   AST 40  --   --   ALT 29  --   --   ALKPHOS 74  --   --   BILITOT 0.5  --   --   < > = values in this interval not displayed. ------------------------------------------------------------------------------------------------------------------  Cardiac Enzymes  Recent Labs Lab 10/16/16 1635 10/16/16 1933 10/17/16 0137  TROPONINI 0.04* 0.03* 0.04*   ------------------------------------------------------------------------------------------------------------------  RADIOLOGY:  Dg Chest 2 View  Result Date: 10/16/2016 CLINICAL DATA:  Syncope and tachycardia today. EXAM: CHEST  2 VIEW COMPARISON:  PA and lateral chest 10/10/2016. FINDINGS: There is cardiomegaly without edema. No consolidative process, pneumothorax or  effusion. Aortic atherosclerosis noted. No acute bony abnormality. IMPRESSION: No acute disease. Cardiomegaly. Atherosclerosis. Electronically Signed   By: Inge Rise M.D.   On: 10/16/2016 12:18   Ct Renal Stone Study  Result Date: 10/16/2016 CLINICAL DATA:  Right lower quadrant pain EXAM: CT  ABDOMEN AND PELVIS WITHOUT CONTRAST TECHNIQUE: Multidetector CT imaging of the abdomen and pelvis was performed following the standard protocol without IV contrast. COMPARISON:  02/20/2014 FINDINGS: Lower chest: Lung bases shows no acute abnormality. Small hiatal hernia. Axial image 1 there is partial visualized subcutaneous nodule in right lower chest wall measures about 1.3 cm. Pathologic lymph node cannot be excluded. Clinical correlation is necessary. Hepatobiliary: Unenhanced liver shows no biliary ductal dilatation. The patient is status post cholecystectomy. Pancreas: Significant atrophic fatty replaced pancreas. Spleen: Normal unenhanced spleen. Adrenals/Urinary Tract: No adrenal gland mass. Bilateral kidneys shows a lobulated contour. Mild bilateral renal cortical thinning probable due to atrophy. No nephrolithiasis. No hydronephrosis or hydroureter. No calcified ureteral calculi are noted. Stable chronic mild distension of the right renal pelvis. Nonspecific mild right perinephric stranding. No calcified calculi are noted within urinary bladder. No thickening of urinary bladder wall. No bladder filling defects are noted. Stomach/Bowel: Study is limited without oral contrast. There is no small bowel obstruction. Abundant stool noted in right colon. No pericecal inflammation. Normal appendix partially visualized in coronal image 68. There is some fecal like material within terminal ileum probable incompetent ileocecal valve. Abundant stool noted within transverse colon. There is some colonic stool in descending colon. Moderate stool noted in rectosigmoid colon. Colonic diverticula are noted proximal sigmoid colon. There is no evidence of acute diverticulitis or colitis. No distal colonic obstruction. Vascular/Lymphatic: There is no adenopathy. Extensive atherosclerotic calcifications of abdominal aorta and iliac arteries. Reproductive: The uterus is atrophic.  No adnexal mass. Other: No ascites or free  abdominal air. Nonspecific small bilateral inguinal lymph nodes are noted. Musculoskeletal: No destructive bony lesions are noted. There is dextroscoliosis of the lumbar spine. Sagittal images of the spine shows osteopenia and degenerative changes thoracolumbar spine. IMPRESSION: 1. Small hiatal hernia is noted. There is nonspecific subcutaneous nodule in right anterolateral chest wall axial image 1 partially visualized measures 1.3 cm. Clinical correlation is necessary to exclude borderline enlarged lymph node. 2. Status post cholecystectomy. Markedly atrophic fatty replaced pancreas. 3. No small bowel obstruction. 4. Abundant stool noted in right colon and transverse colon. No pericecal inflammation. Normal appendix is partially visualized. 5. Moderate stool noted in rectosigmoid colon. Colonic diverticula are noted proximal sigmoid colon. No evidence of acute diverticulitis. 6. No nephrolithiasis. No hydronephrosis or hydroureter. No calcified ureteral calculi are noted. No calcified calculi are noted within urinary bladder. 7. Dextroscoliosis lumbar spine. Degenerative changes thoracolumbar spine. Electronically Signed   By: Lahoma Crocker M.D.   On: 10/16/2016 13:14     ASSESSMENT AND PLAN:   83 year female with a history of diastolic heart failure, atrial fibrillation on anticoagulation and diabetes who presented to the hospital with SVT. For further details please refer the H&P.   1. Atrial fibrillation with RVR/SVT: Patient received 5 mg IV at of metoprolol in the emergency room and has been in normal sinus rhythm with heart rates less than 100. She will be evaluated by cardiology.  2. Elevated troponin is due to demand ischemia and not ACS  3. Constipation: Patient was started on laxatives. Constipation was etiology of urinary retention.  4. Chronic diastolic heart failure: Patient will continue torsemide  5. Essential hypertension on  metoprolol  6. PAF: Continue metoprolol for heart  rate control and intact cognition with Coumadin  7. Hypothyroidism: Continue Synthroid TSH is 4.8 however free T4 was within normal limits     Management plans discussed with the patient and she is in agreement.  CODE STATUS: full  TOTAL TIME TAKING CARE OF THIS PATIENT: 30 minutes.     POSSIBLE D/C today, DEPENDING ON CLINICAL CONDITION.   Karen Kinnard M.D on 10/17/2016 at 2:59 PM  Between 7am to 6pm - Pager - [phone] After 6pm go to [email or web address] - password [email or web address]  Office  [phone]  CC: Primary care physician; Kirk Ruths., MD  Note: This dictation was prepared with Dragon dictation along with smaller phrase technology. Any transcriptional errors that result from this process are unintentional.

## 2016-10-17 NOTE — Progress Notes (Signed)
Dr. Nehemiah Massed notified that patient is back in [place] [facility]. He is speaking with Nicki Reaper, PharmD regarding amio orders.

## 2016-10-17 NOTE — Discharge Summary (Addendum)
[facility] [place] NAME: Tammie Mcmahon    MR#:  [id number]  DATE OF BIRTH:  [birth year]  DATE OF ADMISSION:  10/16/2016 ADMITTING PHYSICIAN: Theodoro Grist, MD  DATE OF DISCHARGE: 10/18/2016  PRIMARY CARE PHYSICIAN: Kirk Ruths., MD    ADMISSION DIAGNOSIS:  SVT (supraventricular tachycardia) (HCC) [I47.1] Atrial fibrillation with RVR (HCC) [I48.91] Constipation, unspecified constipation type [K59.00]  DISCHARGE DIAGNOSIS:  Active Problems:   Other specified cardiac arrhythmias (CODE)   SECONDARY DIAGNOSIS:   Past Medical History:  Diagnosis Date  . (HFpEF) heart failure with preserved ejection fraction ([place])   . Atrial fibrillation and flutter ([place])   . Breast cancer ([place])   . CKD (chronic kidney disease), stage III   . Diabetes mellitus without complication ([place])   . Hyperlipidemia   . Hypertension   . Low back pain   . Menopausal and postmenopausal disorder   . PA (pernicious anemia)   . Thyroid disease   . Valvular heart disease     HOSPITAL COURSE:  83 year female with a history of diastolic heart failure, atrial fibrillation on anticoagulation and diabetes who presented to the hospital with SVT. For further details please refer the H&P.   1. Atrial fibrillation with RVR/SVT: Patient initially received IV metoprolol and converted to normal sinus rhythm. She then had an episode of a atrial fibrillation with RVR. She was started on amiodarone as per cardiology consultant. She has now been in normal sinus rhythm with heart rates less than 100. Cardiology is recommending to continue amiodarone 400 mg by mouth twice a day for maintenance of normal sinus rhythm with plan for tapering over the next 1-2 weeks.  She will need INR checked on Monday.   2. Elevated troponin is due to demand ischemia and not ACS  3. Constipation: This is improved.  4. Chronic diastolic heart failure: Patient will continue torsemide  5.  Essential hypertension on metoprolol  6. PAF: Continue metoprolol for heart rate control and anticoagulation with Coumadin  7. Hypothyroidism: Continue Synthroid TSH is 4.8 however free T4 was within normal limits  DISCHARGE CONDITIONS AND DIET:   Stable Heart healthy diet  CONSULTS OBTAINED:  Treatment Team:  Corey Skains, MD  DRUG ALLERGIES:  No Known Allergies  DISCHARGE MEDICATIONS:   Current Discharge Medication List    START taking these medications   Details  amiodarone (PACERONE) 200 MG tablet Take 2 tablets (400 mg total) by mouth 2 (two) times daily. Qty: 60 tablet, Refills: 0      CONTINUE these medications which have NOT CHANGED   Details  cholecalciferol (VITAMIN D) 1000 units tablet Take 1,000 Units by mouth daily.    DULoxetine (CYMBALTA) 60 MG capsule Take 60 mg by mouth daily. take 1 capsule by mouth once daily    levothyroxine (SYNTHROID) 50 MCG tablet Take 1 tablet (50 mcg total) by mouth daily before breakfast. Qty: 30 tablet, Refills: 0    metoprolol succinate (TOPROL-XL) 100 MG 24 hr tablet Take 100 mg by mouth daily. Refills: 1    omeprazole (PRILOSEC) 20 MG capsule Take 20 mg by mouth daily.    pravastatin (PRAVACHOL) 40 MG tablet take 1 tablet by mouth EVERY NIGHT    torsemide (DEMADEX) 20 MG tablet Take 40 mg by mouth daily.    !! warfarin (COUMADIN) 1 MG tablet Take 1 mg by mouth as directed. Taking 3mg  daily: 1mg  tablet and 2mg  tablet; per RN at Dr. Tonette Bihari office    !!  warfarin (COUMADIN) 2 MG tablet Take 2 mg by mouth daily. Taking 3mg  daily: 2mg  tablet and 1mg  tablet per RN at Dr. Tonette Bihari office    azelastine (ASTELIN) 0.1 % nasal spray Place 1 spray into both nostrils daily as needed. Refills: 0    benzonatate (TESSALON) 200 MG capsule Take 1 capsule by mouth 3 (three) times daily as needed. Refills: 0    diclofenac sodium (VOLTAREN) 1 % GEL Apply topically 4 (four) times daily.    potassium chloride (MICRO-K) 10 MEQ  CR capsule Take 10 mEq by mouth 2 (two) times daily. take 1 capsule by mouth twice a day     !! - Potential duplicate medications found. Please discuss with provider.    STOP taking these medications     ciprofloxacin (CIPRO) 500 MG tablet               Today   CHIEF COMPLAINT:  Patient doing well this morning.  VITAL SIGNS:  Blood pressure (!) 154/77, pulse 86, temperature 98.3 F (36.8 C), temperature source Oral, resp. rate 16, height 5\' 5"  (1.651 m), weight 74.8 kg (164 lb 14.4 oz), SpO2 97 %.   REVIEW OF SYSTEMS:  Review of Systems  Constitutional: Negative.  Negative for chills, fever and malaise/fatigue.  HENT: Negative.  Negative for ear discharge, ear pain, hearing loss, nosebleeds and sore throat.   Eyes: Negative.  Negative for blurred vision and pain.  Respiratory: Negative.  Negative for cough, hemoptysis, shortness of breath and wheezing.   Cardiovascular: Negative.  Negative for chest pain, palpitations and leg swelling.  Gastrointestinal: Negative for abdominal pain, blood in stool, constipation, diarrhea, nausea and vomiting.  Genitourinary: Negative.  Negative for dysuria.  Musculoskeletal: Negative.  Negative for back pain.  Skin: Negative.   Neurological: Negative for dizziness, tremors, speech change, focal weakness, seizures and headaches.  Endo/Heme/Allergies: Negative.  Does not bruise/bleed easily.  Psychiatric/Behavioral: Negative.  Negative for depression, hallucinations and suicidal ideas.     PHYSICAL EXAMINATION:  GENERAL:  83 y.o.-year-old patient lying in the bed with no acute distress.  NECK:  Supple, no jugular venous distention. No thyroid enlargement, no tenderness.  LUNGS: Normal breath sounds bilaterally, no wheezing, rales,rhonchi  No use of accessory muscles of respiration.  CARDIOVASCULAR: S1, S2 normal. No murmurs, rubs, or gallops.  ABDOMEN: Soft, non-tender, non-distended. Bowel sounds present. No organomegaly or mass.   EXTREMITIES: No pedal edema, cyanosis, or clubbing.  PSYCHIATRIC: The patient is alert and oriented x 3.  SKIN: No obvious rash, lesion, or ulcer.   DATA REVIEW:   CBC  Recent Labs Lab 10/17/16 0453  WBC 10.6  HGB 12.3  HCT 36.4  PLT 328    Chemistries   Recent Labs Lab 10/16/16 1110  10/17/16 0453  NA 135  --  138  K 3.9  --  3.4*  CL 98*  --  101  CO2 27  --  28  GLUCOSE 145*  --  114*  BUN 15  --  14  CREATININE 1.04*  < > 0.89  CALCIUM 8.7*  --  7.9*  MG 2.2  --   --   AST 40  --   --   ALT 29  --   --   ALKPHOS 74  --   --   BILITOT 0.5  --   --   < > = values in this interval not displayed.  Cardiac Enzymes  Recent Labs Lab 10/16/16 1635 10/16/16 1933 10/17/16 [id number]  TROPONINI 0.04* 0.03* 0.04*    Microbiology Results  @[email or web address] @  RADIOLOGY:  Dg Chest 2 View  Result Date: 10/16/2016 CLINICAL DATA:  Syncope and tachycardia today. EXAM: CHEST  2 VIEW COMPARISON:  PA and lateral chest 10/10/2016. FINDINGS: There is cardiomegaly without edema. No consolidative process, pneumothorax or effusion. Aortic atherosclerosis noted. No acute bony abnormality. IMPRESSION: No acute disease. Cardiomegaly. Atherosclerosis. Electronically Signed   By: Inge Rise M.D.   On: 10/16/2016 12:18   Ct Renal Stone Study  Result Date: 10/16/2016 CLINICAL DATA:  Right lower quadrant pain EXAM: CT ABDOMEN AND PELVIS WITHOUT CONTRAST TECHNIQUE: Multidetector CT imaging of the abdomen and pelvis was performed following the standard protocol without IV contrast. COMPARISON:  02/20/2014 FINDINGS: Lower chest: Lung bases shows no acute abnormality. Small hiatal hernia. Axial image 1 there is partial visualized subcutaneous nodule in right lower chest wall measures about 1.3 cm. Pathologic lymph node cannot be excluded. Clinical correlation is necessary. Hepatobiliary: Unenhanced liver shows no biliary ductal dilatation. The patient is status post cholecystectomy. Pancreas:  Significant atrophic fatty replaced pancreas. Spleen: Normal unenhanced spleen. Adrenals/Urinary Tract: No adrenal gland mass. Bilateral kidneys shows a lobulated contour. Mild bilateral renal cortical thinning probable due to atrophy. No nephrolithiasis. No hydronephrosis or hydroureter. No calcified ureteral calculi are noted. Stable chronic mild distension of the right renal pelvis. Nonspecific mild right perinephric stranding. No calcified calculi are noted within urinary bladder. No thickening of urinary bladder wall. No bladder filling defects are noted. Stomach/Bowel: Study is limited without oral contrast. There is no small bowel obstruction. Abundant stool noted in right colon. No pericecal inflammation. Normal appendix partially visualized in coronal image 68. There is some fecal like material within terminal ileum probable incompetent ileocecal valve. Abundant stool noted within transverse colon. There is some colonic stool in descending colon. Moderate stool noted in rectosigmoid colon. Colonic diverticula are noted proximal sigmoid colon. There is no evidence of acute diverticulitis or colitis. No distal colonic obstruction. Vascular/Lymphatic: There is no adenopathy. Extensive atherosclerotic calcifications of abdominal aorta and iliac arteries. Reproductive: The uterus is atrophic.  No adnexal mass. Other: No ascites or free abdominal air. Nonspecific small bilateral inguinal lymph nodes are noted. Musculoskeletal: No destructive bony lesions are noted. There is dextroscoliosis of the lumbar spine. Sagittal images of the spine shows osteopenia and degenerative changes thoracolumbar spine. IMPRESSION: 1. Small hiatal hernia is noted. There is nonspecific subcutaneous nodule in right anterolateral chest wall axial image 1 partially visualized measures 1.3 cm. Clinical correlation is necessary to exclude borderline enlarged lymph node. 2. Status post cholecystectomy. Markedly atrophic fatty replaced  pancreas. 3. No small bowel obstruction. 4. Abundant stool noted in right colon and transverse colon. No pericecal inflammation. Normal appendix is partially visualized. 5. Moderate stool noted in rectosigmoid colon. Colonic diverticula are noted proximal sigmoid colon. No evidence of acute diverticulitis. 6. No nephrolithiasis. No hydronephrosis or hydroureter. No calcified ureteral calculi are noted. No calcified calculi are noted within urinary bladder. 7. Dextroscoliosis lumbar spine. Degenerative changes thoracolumbar spine. Electronically Signed   By: Lahoma Crocker M.D.   On: 10/16/2016 13:14      Management plans discussed with the patient and she is in agreement. Stable for discharge home  Patient should follow up with cardiology  CODE STATUS:     Code Status Orders        Start     Ordered   10/16/16 1547  Full code  Continuous     10/16/16 1547  Code Status History    Date Active Date Inactive Code Status Order ID Comments User Context   09/10/2016  5:07 AM 09/15/2016  6:26 PM Full Code [id number]  Harrie Foreman, MD Inpatient      TOTAL TIME TAKING CARE OF THIS PATIENT: 37 minutes.    Note: This dictation was prepared with Dragon dictation along with smaller phrase technology. Any transcriptional errors that result from this process are unintentional.  Rhyann Berton M.D on 10/18/2016 at 8:41 AM  Between 7am to 6pm - Pager - [phone] After 6pm go to [email or web address] - password [email or web address]  Office  [phone]  CC: Primary care physician; Kirk Ruths., MD

## 2016-10-17 NOTE — Progress Notes (Signed)
ANTICOAGULATION CONSULT NOTE - Initial Consult  Pharmacy Consult for Warfarin Indication: atrial fibrillation  No Known Allergies  Patient Measurements: Height: 5\' 5"  (165.1 cm) Weight: 164 lb 14.4 oz (74.8 kg) IBW/kg (Calculated) : 57  Vital Signs: Temp: 98.3 F (36.8 C) (01/12 1137) Temp Source: Oral (01/12 1137) BP: 138/79 (01/12 1137) Pulse Rate: 89 (01/12 1137)  Labs:  Recent Labs  10/16/16 1110 10/16/16 1635 10/16/16 1933 10/17/16 0137 10/17/16 0453  HGB 13.2 12.1  --   --  12.3  HCT 40.1 36.1  --   --  36.4  PLT 373 339  --   --  328  LABPROT 19.1*  --   --   --  17.5*  INR 1.59  --   --   --  1.42  CREATININE 1.04* 1.00  --   --  0.89  TROPONINI 0.03* 0.04* 0.03* 0.04*  --     Estimated Creatinine Clearance: 45.9 mL/min (by C-G formula based on SCr of 0.89 mg/dL).   Medical History: Past Medical History:  Diagnosis Date  . (HFpEF) heart failure with preserved ejection fraction ([place])   . Atrial fibrillation and flutter ([place])   . Breast cancer ([place])   . CKD (chronic kidney disease), stage III   . Diabetes mellitus without complication ([place])   . Hyperlipidemia   . Hypertension   . Low back pain   . Menopausal and postmenopausal disorder   . PA (pernicious anemia)   . Thyroid disease   . Valvular heart disease     Medications:  Prescriptions Prior to Admission  Medication Sig Dispense Refill Last Dose  . cholecalciferol (VITAMIN D) 1000 units tablet Take 1,000 Units by mouth daily.   10/16/2016 at 0700  . ciprofloxacin (CIPRO) 500 MG tablet Take 1 tablet by mouth 2 (two) times daily. For 7 days  0 Past Week at 0700  . DULoxetine (CYMBALTA) 60 MG capsule Take 60 mg by mouth daily. take 1 capsule by mouth once daily   10/16/2016 at 0700  . levothyroxine (SYNTHROID) 50 MCG tablet Take 1 tablet (50 mcg total) by mouth daily before breakfast. 30 tablet 0 10/16/2016 at 0700  . metoprolol succinate (TOPROL-XL) 100 MG 24 hr tablet Take 100 mg by mouth daily.   1 10/15/2016 at 1300  . omeprazole (PRILOSEC) 20 MG capsule Take 20 mg by mouth daily.   10/15/2016 at 0700  . pravastatin (PRAVACHOL) 40 MG tablet take 1 tablet by mouth EVERY NIGHT   10/15/2016 at 2000  . torsemide (DEMADEX) 20 MG tablet Take 40 mg by mouth daily.   10/16/2016 at 0700  . warfarin (COUMADIN) 1 MG tablet Take 1 mg by mouth as directed. Taking 3mg  daily: 1mg  tablet and 2mg  tablet; per RN at Dr. Tonette Bihari office   10/15/2016 at 1800  . warfarin (COUMADIN) 2 MG tablet Take 2 mg by mouth daily. Taking 3mg  daily: 2mg  tablet and 1mg  tablet per RN at Dr. Tonette Bihari office   10/15/2016 at 1800  . azelastine (ASTELIN) 0.1 % nasal spray Place 1 spray into both nostrils daily as needed.  0 prn at prn  . benzonatate (TESSALON) 200 MG capsule Take 1 capsule by mouth 3 (three) times daily as needed.  0 prn at prn  . diclofenac sodium (VOLTAREN) 1 % GEL Apply topically 4 (four) times daily.   prn at prn  . potassium chloride (MICRO-K) 10 MEQ CR capsule Take 10 mEq by mouth 2 (two) times daily. take 1 capsule by  mouth twice a day   10/16/2016 at 0700    Assessment: 83 y/o F with a h/o CHF and AF on warfarin 3 mg daily PTA admitted with SVT and subtherapeutic INR.   Goal of Therapy:  INR 2-3   Plan:  Warfarin dose not given last PM and INR remains subtherapeutic decreasing. Will continue with PTA dosing of 3 mg daily as amiodarone added today and will likely increase INR while on warfarin.   Ulice Dash D 10/17/2016,3:05 PM

## 2016-10-17 NOTE — Care Management Obs Status (Signed)
[facility] NOTIFICATION   Patient Details  Name: Tammie Mcmahon MRN: [id number] Date of Birth: 04-16-1928   Medicare Observation Status Notification Given:  Yes    Katrina Stack, RN 10/17/2016, 11:09 AM

## 2016-10-18 DIAGNOSIS — I471 Supraventricular tachycardia: Secondary | ICD-10-CM | POA: Diagnosis not present

## 2016-10-18 LAB — PROTIME-INR
INR: 1.38
PROTHROMBIN TIME: 17.1 s — AB (ref 11.4–15.2)

## 2016-10-18 MED ORDER — AMIODARONE HCL 200 MG PO TABS: 400.0000 mg | ORAL_TABLET | Freq: Two times a day (BID) | ORAL | 0 refills | Status: DC

## 2016-10-18 NOTE — Progress Notes (Signed)
ANTICOAGULATION CONSULT NOTE - Initial Consult  Pharmacy Consult for Warfarin Indication: atrial fibrillation  No Known Allergies  Patient Measurements: Height: 5\' 5"  (165.1 cm) Weight: 164 lb 14.4 oz (74.8 kg) IBW/kg (Calculated) : 57  Vital Signs: Temp: 97.6 F (36.4 C) (01/13 0920) Temp Source: Oral (01/13 0920) BP: 149/86 (01/13 0920) Pulse Rate: 89 (01/13 0920)  Labs:  Recent Labs  10/16/16 1110 10/16/16 1635 10/16/16 1933 10/17/16 0137 10/17/16 0453 10/18/16 0500  HGB 13.2 12.1  --   --  12.3  --   HCT 40.1 36.1  --   --  36.4  --   PLT 373 339  --   --  328  --   LABPROT 19.1*  --   --   --  17.5* 17.1*  INR 1.59  --   --   --  1.42 1.38  CREATININE 1.04* 1.00  --   --  0.89  --   TROPONINI 0.03* 0.04* 0.03* 0.04*  --   --     Estimated Creatinine Clearance: 45.9 mL/min (by C-G formula based on SCr of 0.89 mg/dL).   Medical History: Past Medical History:  Diagnosis Date  . (HFpEF) heart failure with preserved ejection fraction ([place])   . Atrial fibrillation and flutter ([place])   . Breast cancer ([place])   . CKD (chronic kidney disease), stage III   . Diabetes mellitus without complication ([place])   . Hyperlipidemia   . Hypertension   . Low back pain   . Menopausal and postmenopausal disorder   . PA (pernicious anemia)   . Thyroid disease   . Valvular heart disease     Medications:  Prescriptions Prior to Admission  Medication Sig Dispense Refill Last Dose  . cholecalciferol (VITAMIN D) 1000 units tablet Take 1,000 Units by mouth daily.   10/16/2016 at 0700  . ciprofloxacin (CIPRO) 500 MG tablet Take 1 tablet by mouth 2 (two) times daily. For 7 days  0 Past Week at 0700  . DULoxetine (CYMBALTA) 60 MG capsule Take 60 mg by mouth daily. take 1 capsule by mouth once daily   10/16/2016 at 0700  . levothyroxine (SYNTHROID) 50 MCG tablet Take 1 tablet (50 mcg total) by mouth daily before breakfast. 30 tablet 0 10/16/2016 at 0700  . metoprolol succinate  (TOPROL-XL) 100 MG 24 hr tablet Take 100 mg by mouth daily.  1 10/15/2016 at 1300  . omeprazole (PRILOSEC) 20 MG capsule Take 20 mg by mouth daily.   10/15/2016 at 0700  . pravastatin (PRAVACHOL) 40 MG tablet take 1 tablet by mouth EVERY NIGHT   10/15/2016 at 2000  . torsemide (DEMADEX) 20 MG tablet Take 40 mg by mouth daily.   10/16/2016 at 0700  . warfarin (COUMADIN) 1 MG tablet Take 1 mg by mouth as directed. Taking 3mg  daily: 1mg  tablet and 2mg  tablet; per RN at Dr. Tonette Bihari office   10/15/2016 at 1800  . warfarin (COUMADIN) 2 MG tablet Take 2 mg by mouth daily. Taking 3mg  daily: 2mg  tablet and 1mg  tablet per RN at Dr. Tonette Bihari office   10/15/2016 at 1800  . azelastine (ASTELIN) 0.1 % nasal spray Place 1 spray into both nostrils daily as needed.  0 prn at prn  . benzonatate (TESSALON) 200 MG capsule Take 1 capsule by mouth 3 (three) times daily as needed.  0 prn at prn  . diclofenac sodium (VOLTAREN) 1 % GEL Apply topically 4 (four) times daily.   prn at prn  . potassium  chloride (MICRO-K) 10 MEQ CR capsule Take 10 mEq by mouth 2 (two) times daily. take 1 capsule by mouth twice a day   10/16/2016 at 0700    Assessment: 83 y/o F with a h/o CHF and AF on warfarin 3 mg daily PTA admitted with SVT and subtherapeutic INR.   Goal of Therapy:  INR 2-3   Plan:  Warfarin dose not given on 1/11 and INR remains subtherapeutic decreasing. Will continue with PTA dosing of 3 mg daily as amiodarone added yesterday and will likely increase INR while on warfarin.   Analynn Daum D 10/18/2016,11:09 AM

## 2016-10-18 NOTE — Progress Notes (Signed)
Discharge: Pt d/c from room via wheelchair, Family member (neice) with the pt. Discharge instructions given to the patient and family members.  No questions from pt, reintegrated to the pt to call or go to the ED for chest discomfort. Pt dressed in street clothes and left with discharge papers and prescriptions in hand. IV d/ced, tele removed and no complaints of pain or discomfort.

## 2016-10-18 NOTE — Progress Notes (Signed)
Pt. Slept well throughout the night with no c/o pain SOB or acute distress noted. HR ran SR in 70's-80's throughout the night.

## 2016-10-18 NOTE — Care Management Note (Signed)
Case Management Note  Patient Details  Name: Tammie Mcmahon MRN: [id number] Date of Birth: 1928/05/24  Subjective/Objective:       Referral faxed to [facility] to resume HH-PT and RN services per Ms Ambs is already an open client of [facility].              Action/Plan:   Expected Discharge Date:  10/18/16               Expected Discharge Plan:     In-House Referral:     Discharge planning Services     Post Acute Care Choice:    Choice offered to:     DME Arranged:    DME Agency:     HH Arranged:    HH Agency:     Status of Service:     If discussed at [employer], dates discussed:    Additional Comments:  Rylynne Schicker A, RN 10/18/2016, 9:26 AM

## 2016-10-18 NOTE — Progress Notes (Signed)
[facility] Encounter Note  Patient: Tammie Mcmahon / Admit Date: 10/16/2016 / Date of Encounter: 10/18/2016, 6:30 AM   Subjective: Patient had no episodes of atrial fibrillation and/or palpitations overnight. Currently patient in normal sinus rhythm without evidence of congestive heart failure. Elevated troponin more consistent with demand ischemia rather than acute coronary syndrome  Review of Systems: Positive for: Shortness of breath Negative for: Vision change, hearing change, syncope, dizziness, nausea, vomiting,diarrhea, bloody stool, stomach pain, cough, congestion, diaphoresis, urinary frequency, urinary pain,skin lesions, skin rashes Others previously listed  Objective: Telemetry: Normal sinus rhythm Physical Exam: Blood pressure (!) 154/77, pulse 86, temperature 98.3 F (36.8 C), temperature source Oral, resp. rate 16, height 5\' 5"  (1.651 m), weight 74.8 kg (164 lb 14.4 oz), SpO2 97 %. Body mass index is 27.44 kg/m. General: Well developed, well nourished, in no acute distress. Head: Normocephalic, atraumatic, sclera non-icteric, no xanthomas, nares are without discharge. Neck: No apparent masses Lungs: Normal respirations with no wheezes, no rhonchi, no rales , few crackles   Heart: Regular rate and rhythm, normal S1 S2, no murmur, no rub, no gallop, PMI is normal size and placement, carotid upstroke normal without bruit, jugular venous pressure normal Abdomen: Soft, non-tender, non-distended with normoactive bowel sounds. No hepatosplenomegaly. Abdominal aorta is normal size without bruit Extremities: Trace edema, no clubbing, no cyanosis, no ulcers,  Peripheral: 2+ radial, 2+ femoral, 2+ dorsal pedal pulses Neuro: Alert and oriented. Moves all extremities spontaneously. Psych:  Responds to questions appropriately with a normal affect.   Intake/Output Summary (Last 24 hours) at 10/18/16 0630 Last data filed at 10/18/16 0521  Gross per 24 hour   Intake              600 ml  Output             1050 ml  Net             -450 ml    Inpatient Medications:  . amiodarone  400 mg Oral BID  . aspirin EC  81 mg Oral Daily  . bisacodyl  10 mg Rectal Daily  . diclofenac sodium  2 g Topical QID  . levothyroxine  50 mcg Oral QAC breakfast  . metoprolol succinate  100 mg Oral Daily  . pantoprazole  40 mg Oral Daily  . polyethylene glycol  17 g Oral Daily  . potassium chloride  10 mEq Oral Daily  . senna-docusate  1 tablet Oral BID  . sodium chloride flush  3 mL Intravenous Q12H  . sodium chloride flush  3 mL Intravenous Q12H  . torsemide  40 mg Oral Daily  . warfarin  3 mg Oral q1800  . Warfarin - Pharmacist Dosing Inpatient   Does not apply q1800   Infusions:   Labs:  Recent Labs  10/16/16 1110 10/16/16 1635 10/17/16 0453  NA 135  --  138  K 3.9  --  3.4*  CL 98*  --  101  CO2 27  --  28  GLUCOSE 145*  --  114*  BUN 15  --  14  CREATININE 1.04* 1.00 0.89  CALCIUM 8.7*  --  7.9*  MG 2.2  --   --     Recent Labs  10/16/16 1110  AST 40  ALT 29  ALKPHOS 74  BILITOT 0.5  PROT 6.9  ALBUMIN 3.9    Recent Labs  10/16/16 1635 10/17/16 0453  WBC 11.4* 10.6  HGB 12.1 12.3  HCT 36.1  36.4  MCV 89.0 88.5  PLT 339 328    Recent Labs  10/16/16 1110 10/16/16 1635 10/16/16 1933 10/17/16 0137  TROPONINI 0.03* 0.04* 0.03* 0.04*   Invalid input(s): POCBNP No results for input(s): HGBA1C in the last 72 hours.   Weights: Filed Weights   10/16/16 1939  Weight: 74.8 kg (164 lb 14.4 oz)     Radiology/Studies:  Dg Chest 2 View  Result Date: 10/16/2016 CLINICAL DATA:  Syncope and tachycardia today. EXAM: CHEST  2 VIEW COMPARISON:  PA and lateral chest 10/10/2016. FINDINGS: There is cardiomegaly without edema. No consolidative process, pneumothorax or effusion. Aortic atherosclerosis noted. No acute bony abnormality. IMPRESSION: No acute disease. Cardiomegaly. Atherosclerosis. Electronically Signed   By: Inge Rise M.D.   On: 10/16/2016 12:18   Dg Chest 2 View  Result Date: 10/10/2016 CLINICAL DATA:  Atrial fibrillation symptoms beginning today. Chest congestion. EXAM: CHEST  2 VIEW COMPARISON:  Single-view of the chest 10/06/2016 and 09/09/2016. FINDINGS: There is cardiomegaly without edema. No consolidative process, pneumothorax or effusion. Aortic atherosclerosis is noted. Thoracolumbar scoliosis is seen. IMPRESSION: Cardiomegaly without acute disease. Atherosclerosis. Electronically Signed   By: Inge Rise M.D.   On: 10/10/2016 18:11   Ct Head Wo Contrast  Result Date: 10/06/2016 CLINICAL DATA:  Weakness. Feeling unwell since 1600 hours today. History of atrial fibrillation, breast cancer, diabetes, hypertension and hyperlipidemia. EXAM: CT HEAD WITHOUT CONTRAST TECHNIQUE: Contiguous axial images were obtained from the base of the skull through the vertex without intravenous contrast. COMPARISON:  MRI head September 10, 2016 FINDINGS: BRAIN: The ventricles and sulci are normal for age. No intraparenchymal hemorrhage, mass effect nor midline shift. Patchy supratentorial white matter hypodensities less than expected for patient's age less than expected, though non-specific are most compatible with chronic small vessel ischemic disease. Symmetric basal ganglia mineralization. No acute large vascular territory infarcts. No abnormal extra-axial fluid collections. Basal cisterns are patent. VASCULAR: Moderate calcific atherosclerosis of the carotid siphons. SKULL: No skull fracture. No significant scalp soft tissue swelling. 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 SINUSES/ORBITS: The mastoid air-cells and included paranasal sinuses are well-aerated. Status post bilateral ocular lens implants. The included ocular globes and orbital contents are non-suspicious. OTHER: Patient is edentulous. IMPRESSION: Negative CT HEAD for age. Electronically Signed   By: Elon Alas M.D.   On:  10/06/2016 22:51   Dg Chest Port 1 View  Result Date: 10/06/2016 CLINICAL DATA:  Weakness today. History of breast cancer, diabetes, heart failure. EXAM: PORTABLE CHEST 1 VIEW COMPARISON:  Chest radiograph September 09, 2016 FINDINGS: Cardiac silhouette is mildly enlarged an unchanged. Mediastinal silhouette is nonsuspicious. Chronic interstitial changes without pleural effusion or focal consolidation. No pneumothorax. Mild biapical pleural thickening. Osteopenia. Lower thoracic levoscoliosis. Soft tissue planes are nonsuspicious. IMPRESSION: Mild cardiomegaly and chronic interstitial changes. Electronically Signed   By: Elon Alas M.D.   On: 10/06/2016 22:54   Dg Abdomen Acute W/chest  Result Date: 10/13/2016 CLINICAL DATA:  Abdominal pain swollen shortness of breath. EXAM: DG ABDOMEN ACUTE W/ 1V CHEST COMPARISON:  Chest x-ray of October 10, 2014 FINDINGS: The lungs are adequately inflated. The interstitial markings are coarse and slightly more conspicuous today. The cardiac silhouette remains enlarged. The pulmonary vascularity is mildly prominent centrally. There is calcification in the wall of the aortic arch. There is no pleural effusion. Within the abdomen the colonic stool burden is moderate. There is a small amount of small bowel gas within normal caliber small bowel loops. There are  surgical clips in the gallbladder fossa. There is a moderate amount of gas within the stomach and there is gas and stool in the rectum. There is mild dextrocurvature of the lumbar spine. There is degenerative disc disease at multiple lumbar levels. IMPRESSION: Chronic bronchitic changes bilaterally. Mild cardiomegaly with central pulmonary vascular prominence may reflect low-grade compensated CHF. Thoracic aortic atherosclerosis. Moderately increase colonic stool burden may reflect constipation in the appropriate clinical setting. Small amount of gas within normal caliber small bowel loops is likely normal. There is no  evidence of obstruction or perforation. Electronically Signed   By: David  Martinique M.D.   On: 10/13/2016 12:30   Ct Renal Stone Study  Result Date: 10/16/2016 CLINICAL DATA:  Right lower quadrant pain EXAM: CT ABDOMEN AND PELVIS WITHOUT CONTRAST TECHNIQUE: Multidetector CT imaging of the abdomen and pelvis was performed following the standard protocol without IV contrast. COMPARISON:  02/20/2014 FINDINGS: Lower chest: Lung bases shows no acute abnormality. Small hiatal hernia. Axial image 1 there is partial visualized subcutaneous nodule in right lower chest wall measures about 1.3 cm. Pathologic lymph node cannot be excluded. Clinical correlation is necessary. Hepatobiliary: Unenhanced liver shows no biliary ductal dilatation. The patient is status post cholecystectomy. Pancreas: Significant atrophic fatty replaced pancreas. Spleen: Normal unenhanced spleen. Adrenals/Urinary Tract: No adrenal gland mass. Bilateral kidneys shows a lobulated contour. Mild bilateral renal cortical thinning probable due to atrophy. No nephrolithiasis. No hydronephrosis or hydroureter. No calcified ureteral calculi are noted. Stable chronic mild distension of the right renal pelvis. Nonspecific mild right perinephric stranding. No calcified calculi are noted within urinary bladder. No thickening of urinary bladder wall. No bladder filling defects are noted. Stomach/Bowel: Study is limited without oral contrast. There is no small bowel obstruction. Abundant stool noted in right colon. No pericecal inflammation. Normal appendix partially visualized in coronal image 68. There is some fecal like material within terminal ileum probable incompetent ileocecal valve. Abundant stool noted within transverse colon. There is some colonic stool in descending colon. Moderate stool noted in rectosigmoid colon. Colonic diverticula are noted proximal sigmoid colon. There is no evidence of acute diverticulitis or colitis. No distal colonic  obstruction. Vascular/Lymphatic: There is no adenopathy. Extensive atherosclerotic calcifications of abdominal aorta and iliac arteries. Reproductive: The uterus is atrophic.  No adnexal mass. Other: No ascites or free abdominal air. Nonspecific small bilateral inguinal lymph nodes are noted. Musculoskeletal: No destructive bony lesions are noted. There is dextroscoliosis of the lumbar spine. Sagittal images of the spine shows osteopenia and degenerative changes thoracolumbar spine. IMPRESSION: 1. Small hiatal hernia is noted. There is nonspecific subcutaneous nodule in right anterolateral chest wall axial image 1 partially visualized measures 1.3 cm. Clinical correlation is necessary to exclude borderline enlarged lymph node. 2. Status post cholecystectomy. Markedly atrophic fatty replaced pancreas. 3. No small bowel obstruction. 4. Abundant stool noted in right colon and transverse colon. No pericecal inflammation. Normal appendix is partially visualized. 5. Moderate stool noted in rectosigmoid colon. Colonic diverticula are noted proximal sigmoid colon. No evidence of acute diverticulitis. 6. No nephrolithiasis. No hydronephrosis or hydroureter. No calcified ureteral calculi are noted. No calcified calculi are noted within urinary bladder. 7. Dextroscoliosis lumbar spine. Degenerative changes thoracolumbar spine. Electronically Signed   By: Lahoma Crocker M.D.   On: 10/16/2016 13:14     Assessment and Recommendation  83 y.o. female with known acute on chronic diastolic dysfunction congestive heart failure secondary to atrial fibrillation paroxysmal nonvalvular in nature with rapid ventricular rate causing weakness and  fatigue now improved and more stable on adjusted medication management without evidence of myocardial infarction 1. Continue metoprolol for heart rate control of atrial fibrillation 2. Continue amiodarone at 400 mg twice per day for maintenance of normal sinus rhythm which appears to be improved  overnight 3. Continue diuretic torsemide orally at this point due to improved diastolic dysfunction heart failure 4. Decrease warfarin dose to one half due to addition of amiodarone and interaction of these medications 5. No further cardiac diagnostics necessary at this time 6. If patient ambulating well without any further episodes of atrial fibrillation for possible discharged home with follow-up early next week for further adjustments of medication  Signed, Serafina Royals M.D. FACC

## 2016-10-18 NOTE — Progress Notes (Signed)
Pt alert and oriented x4, no complaints of pain or discomfort.  Bed in low position, call bell within reach.  Bed alarms on and functioning.  Assessment done and charted.  Will continue to monitor and do hourly rounding throughout the shift 

## 2016-10-21 ENCOUNTER — Observation Stay
Admission: EM | Admit: 2016-10-21 | Discharge: 2016-10-23 | Disposition: A | Discharge status: Home / Self Care | Payer: Medicare Other | Attending: Internal Medicine | Admitting: Internal Medicine

## 2016-10-21 ENCOUNTER — Emergency Department: Payer: Medicare Other

## 2016-10-21 ENCOUNTER — Encounter: Payer: Self-pay | Admitting: Emergency Medicine

## 2016-10-21 DIAGNOSIS — Z9889 Other specified postprocedural states: Secondary | ICD-10-CM | POA: Insufficient documentation

## 2016-10-21 DIAGNOSIS — Z9012 Acquired absence of left breast and nipple: Secondary | ICD-10-CM | POA: Insufficient documentation

## 2016-10-21 DIAGNOSIS — Z79899 Other long term (current) drug therapy: Secondary | ICD-10-CM | POA: Diagnosis not present

## 2016-10-21 DIAGNOSIS — I5032 Chronic diastolic (congestive) heart failure: Secondary | ICD-10-CM | POA: Insufficient documentation

## 2016-10-21 DIAGNOSIS — E785 Hyperlipidemia, unspecified: Secondary | ICD-10-CM | POA: Diagnosis not present

## 2016-10-21 DIAGNOSIS — I5030 Unspecified diastolic (congestive) heart failure: Secondary | ICD-10-CM | POA: Diagnosis present

## 2016-10-21 DIAGNOSIS — R001 Bradycardia, unspecified: Secondary | ICD-10-CM | POA: Diagnosis not present

## 2016-10-21 DIAGNOSIS — Z9049 Acquired absence of other specified parts of digestive tract: Secondary | ICD-10-CM | POA: Diagnosis not present

## 2016-10-21 DIAGNOSIS — I480 Paroxysmal atrial fibrillation: Secondary | ICD-10-CM | POA: Insufficient documentation

## 2016-10-21 DIAGNOSIS — I10 Essential (primary) hypertension: Secondary | ICD-10-CM | POA: Diagnosis present

## 2016-10-21 DIAGNOSIS — I4891 Unspecified atrial fibrillation: Secondary | ICD-10-CM | POA: Diagnosis present

## 2016-10-21 DIAGNOSIS — M8580 Other specified disorders of bone density and structure, unspecified site: Secondary | ICD-10-CM | POA: Insufficient documentation

## 2016-10-21 DIAGNOSIS — Z8249 Family history of ischemic heart disease and other diseases of the circulatory system: Secondary | ICD-10-CM | POA: Diagnosis not present

## 2016-10-21 DIAGNOSIS — I700 Atherosclerosis of aorta: Secondary | ICD-10-CM | POA: Insufficient documentation

## 2016-10-21 DIAGNOSIS — Z853 Personal history of malignant neoplasm of breast: Secondary | ICD-10-CM | POA: Diagnosis not present

## 2016-10-21 DIAGNOSIS — E1122 Type 2 diabetes mellitus with diabetic chronic kidney disease: Secondary | ICD-10-CM | POA: Diagnosis not present

## 2016-10-21 DIAGNOSIS — K449 Diaphragmatic hernia without obstruction or gangrene: Secondary | ICD-10-CM | POA: Insufficient documentation

## 2016-10-21 DIAGNOSIS — Z7901 Long term (current) use of anticoagulants: Secondary | ICD-10-CM | POA: Insufficient documentation

## 2016-10-21 DIAGNOSIS — I130 Hypertensive heart and chronic kidney disease with heart failure and stage 1 through stage 4 chronic kidney disease, or unspecified chronic kidney disease: Secondary | ICD-10-CM | POA: Insufficient documentation

## 2016-10-21 DIAGNOSIS — Z961 Presence of intraocular lens: Secondary | ICD-10-CM | POA: Insufficient documentation

## 2016-10-21 DIAGNOSIS — M5136 Other intervertebral disc degeneration, lumbar region: Secondary | ICD-10-CM | POA: Insufficient documentation

## 2016-10-21 DIAGNOSIS — E039 Hypothyroidism, unspecified: Secondary | ICD-10-CM | POA: Diagnosis not present

## 2016-10-21 DIAGNOSIS — D510 Vitamin B12 deficiency anemia due to intrinsic factor deficiency: Secondary | ICD-10-CM | POA: Diagnosis not present

## 2016-10-21 DIAGNOSIS — N183 Chronic kidney disease, stage 3 (moderate): Secondary | ICD-10-CM | POA: Diagnosis not present

## 2016-10-21 DIAGNOSIS — N189 Chronic kidney disease, unspecified: Secondary | ICD-10-CM

## 2016-10-21 DIAGNOSIS — I38 Endocarditis, valve unspecified: Secondary | ICD-10-CM | POA: Diagnosis not present

## 2016-10-21 DIAGNOSIS — N179 Acute kidney failure, unspecified: Secondary | ICD-10-CM | POA: Insufficient documentation

## 2016-10-21 DIAGNOSIS — K5730 Diverticulosis of large intestine without perforation or abscess without bleeding: Secondary | ICD-10-CM | POA: Diagnosis not present

## 2016-10-21 LAB — CBC
HCT: 37.4 % (ref 35.0–47.0)
HEMOGLOBIN: 12.4 g/dL (ref 12.0–16.0)
MCH: 29.5 pg (ref 26.0–34.0)
MCHC: 33.1 g/dL (ref 32.0–36.0)
MCV: 89.2 fL (ref 80.0–100.0)
Platelets: 352 10*3/uL (ref 150–440)
RBC: 4.2 MIL/uL (ref 3.80–5.20)
RDW: 15.5 % — ABNORMAL HIGH (ref 11.5–14.5)
WBC: 11.6 10*3/uL — ABNORMAL HIGH (ref 3.6–11.0)

## 2016-10-21 LAB — BASIC METABOLIC PANEL
ANION GAP: 11 (ref 5–15)
BUN: 18 mg/dL (ref 6–20)
CALCIUM: 9.3 mg/dL (ref 8.9–10.3)
CO2: 24 mmol/L (ref 22–32)
CREATININE: 1.97 mg/dL — AB (ref 0.44–1.00)
Chloride: 101 mmol/L (ref 101–111)
GFR, EST AFRICAN AMERICAN: 25 mL/min — AB (ref >60)
GFR, EST NON AFRICAN AMERICAN: 22 mL/min — AB (ref >60)
Glucose, Bld: 132 mg/dL — ABNORMAL HIGH (ref 65–99)
Potassium: 4.4 mmol/L (ref 3.5–5.1)
Sodium: 136 mmol/L (ref 135–145)

## 2016-10-21 LAB — TROPONIN I: TROPONIN I: 0.04 ng/mL — AB (ref <0.03)

## 2016-10-21 NOTE — ED Notes (Signed)
Spoke with patient's great nephew and informed him of the admission process. Told him it would be a short time before the admitting doctor comes in to see her and write orders. He states he will stay about another hour.

## 2016-10-21 NOTE — ED Triage Notes (Signed)
Pt ambulatory to triage with assistance by family member. Pt brought to ED due to heart rate in 30's at home. Pt denies chest pain or other symptoms. Heart rate in triage 33bpm.

## 2016-10-21 NOTE — ED Provider Notes (Signed)
[facility] Emergency Department Provider Note  Time seen: 10:07 PM  I have reviewed the triage vital signs and the nursing notes.   HISTORY  Chief Complaint Bradycardia    HPI Tammie Mcmahon is a 83 y.o. female with a past medical history of atrial fibrillation, CK D, diabetes, hypertension, hyperlipidemia who presents to the emergency department for generalized weakness. According to the patient since noon today she has been experiencing generalized weakness and fatigue. Upon arrival to the emergency department the patient is noted to have a heart rate of 35 bpm. She was recently discharged from the hospital 3 days ago after admission for atrial fibrillation with rapid ventricular response and started on amiodarone. Patient denies any chest pain or shortness breath. Denies any focal weakness. Denies any nausea or vomiting.  Past Medical History:  Diagnosis Date  . (HFpEF) heart failure with preserved ejection fraction ([place])   . Atrial fibrillation and flutter ([place])   . Breast cancer ([place])   . CKD (chronic kidney disease), stage III   . Diabetes mellitus without complication ([place])   . Hyperlipidemia   . Hypertension   . Low back pain   . Menopausal and postmenopausal disorder   . PA (pernicious anemia)   . Thyroid disease   . Valvular heart disease     Patient Active Problem List   Diagnosis Date Noted  . Other specified cardiac arrhythmias (CODE) 10/16/2016  . Confusion 09/10/2016    Past Surgical History:  Procedure Laterality Date  . BREAST SURGERY    . GALLBLADDER SURGERY    . MASTECTOMY Left   . THYROIDECTOMY      Prior to Admission medications   Medication Sig Start Date End Date Taking? Authorizing Provider  amiodarone (PACERONE) 200 MG tablet Take 2 tablets (400 mg total) by mouth 2 (two) times daily. 10/18/16   Bettey Costa, MD  azelastine (ASTELIN) 0.1 % nasal spray Place 1 spray into both nostrils daily as needed. 09/30/16    Historical Provider, MD  benzonatate (TESSALON) 200 MG capsule Take 1 capsule by mouth 3 (three) times daily as needed. 09/30/16   Historical Provider, MD  cholecalciferol (VITAMIN D) 1000 units tablet Take 1,000 Units by mouth daily.    Historical Provider, MD  diclofenac sodium (VOLTAREN) 1 % GEL Apply topically 4 (four) times daily.    Historical Provider, MD  DULoxetine (CYMBALTA) 60 MG capsule Take 60 mg by mouth daily. take 1 capsule by mouth once daily    Historical Provider, MD  levothyroxine (SYNTHROID) 50 MCG tablet Take 1 tablet (50 mcg total) by mouth daily before breakfast. 09/15/16   Epifanio Lesches, MD  metoprolol succinate (TOPROL-XL) 100 MG 24 hr tablet Take 100 mg by mouth daily. 09/18/16   Historical Provider, MD  omeprazole (PRILOSEC) 20 MG capsule Take 20 mg by mouth daily.    Historical Provider, MD  potassium chloride (MICRO-K) 10 MEQ CR capsule Take 10 mEq by mouth 2 (two) times daily. take 1 capsule by mouth twice a day 04/10/14   Historical Provider, MD  pravastatin (PRAVACHOL) 40 MG tablet take 1 tablet by mouth EVERY NIGHT 05/19/14   Historical Provider, MD  torsemide (DEMADEX) 20 MG tablet Take 40 mg by mouth daily.    Historical Provider, MD  warfarin (COUMADIN) 1 MG tablet Take 1 mg by mouth as directed. Taking 3mg  daily: 1mg  tablet and 2mg  tablet; per RN at Dr. Tonette Bihari office    Historical Provider, MD  warfarin (COUMADIN) 2  MG tablet Take 2 mg by mouth daily. Taking 3mg  daily: 2mg  tablet and 1mg  tablet per RN at Dr. Tonette Bihari office    Historical Provider, MD    No Known Allergies  Family History  Problem Relation Age of Onset  . Heart attack Father     Social History Social History  Substance Use Topics  . Smoking status: Never Smoker  . Smokeless tobacco: Current User    Types: Snuff  . Alcohol use No    Review of Systems Constitutional: Negative for fever.Positive for generalized weakness. Cardiovascular: Negative for chest pain. Respiratory:  Negative for shortness of breath. Gastrointestinal: Negative for abdominal pain, vomiting Neurological: Negative for headaches, focal weakness or numbness. 10-point ROS otherwise negative.  ____________________________________________   PHYSICAL EXAM:  Constitutional: Alert and oriented. Well appearing and in no distress. Eyes: Normal exam ENT   Head: Normocephalic and atraumatic   Mouth/Throat: Mucous membranes are moist. Cardiovascular: Normal rate, regular rhythm. No murmurs, rubs, or gallops. Respiratory: Normal respiratory effort without tachypnea nor retractions. Breath sounds are clear  Gastrointestinal: Soft and nontender. No distention. Musculoskeletal: Nontender with normal range of motion in all extremities.  Neurologic:  Normal speech and language. No gross focal neurologic deficits Skin:  Skin is warm, dry and intact.  Psychiatric: Mood and affect are normal. Speech and behavior are normal.   ____________________________________________    EKG  EKG reviewed and interpreted by myself shows a ventricular rhythm at 35 bpm no P waves noted. Widened QRS with left axis deviation mild ST depressions in the lateral leads.  ____________________________________________    RADIOLOGY  Cardiomegaly otherwise negative.  ____________________________________________   INITIAL IMPRESSION / ASSESSMENT AND PLAN / ED COURSE  Pertinent labs & imaging results that were available during my care of the patient were reviewed by me and considered in my medical decision making (see chart for details).  The patient presents him or to department with generalized weakness with a heart rate of 35 bpm. Likely symptomatic bradycardia due to recent amiodarone usage. Patient's EKG appears to show a ventricular rhythm at 35 bpm with no P waves. However the patient's blood pressure is stable currently 129/60. Patient states she is feeling well lying down. We will keep the patient on a Z[place]  monitor and continue to closely monitor in the emergency department while awaiting lab and imaging results. Highly suspect amiodarone to be causing her bradycardia. Patient will require admission to the hospital.   Patient's labs show acute renal insufficiency, we will continue with IV hydration. Continues to be bradycardiac however blood pressure remained stable. Patient will be admitted to the hospital for further treatment.  ____________________________________________   FINAL CLINICAL IMPRESSION(S) / ED DIAGNOSES  Symptomatic bradycardia    Harvest Dark, MD 10/21/16 2243

## 2016-10-21 NOTE — ED Notes (Signed)
Lab called to report elevated troponin of 0.04

## 2016-10-22 ENCOUNTER — Encounter: Payer: Self-pay | Admitting: *Deleted

## 2016-10-22 DIAGNOSIS — R001 Bradycardia, unspecified: Secondary | ICD-10-CM | POA: Diagnosis not present

## 2016-10-22 DIAGNOSIS — I10 Essential (primary) hypertension: Secondary | ICD-10-CM | POA: Diagnosis present

## 2016-10-22 DIAGNOSIS — E785 Hyperlipidemia, unspecified: Secondary | ICD-10-CM | POA: Diagnosis present

## 2016-10-22 DIAGNOSIS — N189 Chronic kidney disease, unspecified: Secondary | ICD-10-CM

## 2016-10-22 DIAGNOSIS — I4891 Unspecified atrial fibrillation: Secondary | ICD-10-CM | POA: Diagnosis present

## 2016-10-22 DIAGNOSIS — I5030 Unspecified diastolic (congestive) heart failure: Secondary | ICD-10-CM | POA: Diagnosis present

## 2016-10-22 DIAGNOSIS — N179 Acute kidney failure, unspecified: Secondary | ICD-10-CM | POA: Diagnosis present

## 2016-10-22 LAB — BASIC METABOLIC PANEL
ANION GAP: 7 (ref 5–15)
BUN: 20 mg/dL (ref 6–20)
CHLORIDE: 103 mmol/L (ref 101–111)
CO2: 27 mmol/L (ref 22–32)
Calcium: 9 mg/dL (ref 8.9–10.3)
Creatinine, Ser: 1.57 mg/dL — ABNORMAL HIGH (ref 0.44–1.00)
GFR calc Af Amer: 33 mL/min — ABNORMAL LOW (ref >60)
GFR calc non Af Amer: 29 mL/min — ABNORMAL LOW (ref >60)
GLUCOSE: 137 mg/dL — AB (ref 65–99)
POTASSIUM: 3.9 mmol/L (ref 3.5–5.1)
Sodium: 137 mmol/L (ref 135–145)

## 2016-10-22 LAB — PROTIME-INR
INR: 1.77
Prothrombin Time: 20.8 seconds — ABNORMAL HIGH (ref 11.4–15.2)

## 2016-10-22 LAB — CBC
HEMATOCRIT: 32.9 % — AB (ref 35.0–47.0)
HEMOGLOBIN: 11 g/dL — AB (ref 12.0–16.0)
MCH: 29.7 pg (ref 26.0–34.0)
MCHC: 33.4 g/dL (ref 32.0–36.0)
MCV: 89 fL (ref 80.0–100.0)
Platelets: 280 10*3/uL (ref 150–440)
RBC: 3.7 MIL/uL — ABNORMAL LOW (ref 3.80–5.20)
RDW: 15.7 % — ABNORMAL HIGH (ref 11.5–14.5)
WBC: 8.9 10*3/uL (ref 3.6–11.0)

## 2016-10-22 LAB — TSH: TSH: 12.856 u[IU]/mL — ABNORMAL HIGH (ref 0.350–4.500)

## 2016-10-22 MED ORDER — ACETAMINOPHEN 650 MG RE SUPP: 650.0000 mg | Freq: Four times a day (QID) | RECTAL | Status: DC | PRN

## 2016-10-22 MED ORDER — WARFARIN - PHARMACIST DOSING INPATIENT
Freq: Every day | Status: DC
  Administered 2016-10-22: 18:00:00

## 2016-10-22 MED ORDER — PRAVASTATIN SODIUM 40 MG PO TABS
40.0000 mg | ORAL_TABLET | Freq: Every day | ORAL | Status: DC
  Administered 2016-10-22: 40 mg via ORAL
  Filled 2016-10-22 (×2): qty 1

## 2016-10-22 MED ORDER — DULOXETINE HCL 60 MG PO CPEP
60.0000 mg | ORAL_CAPSULE | Freq: Every day | ORAL | Status: DC
  Administered 2016-10-22 – 2016-10-23 (×2): 60 mg via ORAL
  Filled 2016-10-22 (×2): qty 1

## 2016-10-22 MED ORDER — ACETAMINOPHEN 325 MG PO TABS: 650.0000 mg | ORAL_TABLET | Freq: Four times a day (QID) | ORAL | Status: DC | PRN

## 2016-10-22 MED ORDER — TORSEMIDE 20 MG PO TABS
40.0000 mg | ORAL_TABLET | Freq: Every day | ORAL | Status: DC
  Administered 2016-10-22 – 2016-10-23 (×2): 40 mg via ORAL
  Filled 2016-10-22 (×2): qty 2

## 2016-10-22 MED ORDER — ENSURE ENLIVE PO LIQD
237.0000 mL | Freq: Two times a day (BID) | ORAL | Status: DC
  Administered 2016-10-22 – 2016-10-23 (×3): 237 mL via ORAL

## 2016-10-22 MED ORDER — LEVOTHYROXINE SODIUM 50 MCG PO TABS
50.0000 ug | ORAL_TABLET | Freq: Every day | ORAL | Status: DC
  Administered 2016-10-22: 50 ug via ORAL
  Filled 2016-10-22: qty 1

## 2016-10-22 MED ORDER — POTASSIUM CHLORIDE CRYS ER 10 MEQ PO TBCR
10.0000 meq | EXTENDED_RELEASE_TABLET | Freq: Two times a day (BID) | ORAL | Status: DC
  Administered 2016-10-22 – 2016-10-23 (×2): 10 meq via ORAL
  Filled 2016-10-22 (×2): qty 1

## 2016-10-22 MED ORDER — PROCHLORPERAZINE EDISYLATE 5 MG/ML IJ SOLN: 5.0000 mg | INTRAMUSCULAR | Status: DC | PRN

## 2016-10-22 MED ORDER — WARFARIN SODIUM 3 MG PO TABS
3.0000 mg | ORAL_TABLET | Freq: Every day | ORAL | Status: DC
  Administered 2016-10-22: 3 mg via ORAL
  Filled 2016-10-22 (×2): qty 1

## 2016-10-22 MED ORDER — PANTOPRAZOLE SODIUM 40 MG PO TBEC
40.0000 mg | DELAYED_RELEASE_TABLET | Freq: Every day | ORAL | Status: DC
  Administered 2016-10-22 – 2016-10-23 (×2): 40 mg via ORAL
  Filled 2016-10-22 (×2): qty 1

## 2016-10-22 NOTE — ED Notes (Signed)
Patient repositioned in bed to right side. Denies need for anything at this time. Awaiting ready bed and will call report to floor.

## 2016-10-22 NOTE — Care Management (Signed)
Placed in observation from home with bradycardia.  Currently followed by [facility] and PT.  Notified agency of observation. cardiology consulting and metoprolol and Amiodarone on hold

## 2016-10-22 NOTE — Plan of Care (Signed)
Problem: Safety: Goal: Ability to remain free from injury will improve Outcome: Progressing Pt does not score high enough for bed alarm to be on, but this RN turned it on due to pt's HR being in the 30's, educated pt to call before she got up just so someone could be in the room if she got dizzy/lightheaded.  Pt in agreement, fall contract signed & non skid socks placed.

## 2016-10-22 NOTE — Progress Notes (Signed)
[facility] [place] NAME: Tammie Mcmahon    MR#:  [id number]  DATE OF BIRTH:  [birth year]  SUBJECTIVE:  CHIEF COMPLAINT:   Chief Complaint  Patient presents with  . Bradycardia  Feeling dizzy, denies any other complaints at this time.  Sleepy REVIEW OF SYSTEMS:  Review of Systems  Constitutional: Positive for malaise/fatigue. Negative for chills, fever and weight loss.  HENT: Negative for nosebleeds and sore throat.   Eyes: Negative for blurred vision.  Respiratory: Negative for cough, shortness of breath and wheezing.   Cardiovascular: Negative for chest pain, orthopnea, leg swelling and PND.  Gastrointestinal: Negative for abdominal pain, constipation, diarrhea, heartburn, nausea and vomiting.  Genitourinary: Negative for dysuria and urgency.  Musculoskeletal: Negative for back pain.  Skin: Negative for rash.  Neurological: Positive for weakness. Negative for dizziness, speech change, focal weakness and headaches.  Endo/Heme/Allergies: Does not bruise/bleed easily.  Psychiatric/Behavioral: Negative for depression.    DRUG ALLERGIES:  No Known Allergies VITALS:  Blood pressure (!) 147/62, pulse (!) 47, temperature 98 F (36.7 C), temperature source Oral, resp. rate 18, height 5\' 5"  (1.651 m), weight 76.1 kg (167 lb 11.2 oz), SpO2 96 %. PHYSICAL EXAMINATION:  Physical Exam  Constitutional: She is oriented to person, place, and time and well-developed, well-nourished, and in no distress.  HENT:  Head: Normocephalic and atraumatic.  Eyes: Conjunctivae and EOM are normal. Pupils are equal, round, and reactive to light.  Neck: Normal range of motion. Neck supple. No tracheal deviation present. No thyromegaly present.  Cardiovascular: Regular rhythm and normal heart sounds.  Bradycardia present.   Pulmonary/Chest: Effort normal and breath sounds normal. No respiratory distress. She has no wheezes. She exhibits no tenderness.  Abdominal:  Soft. Bowel sounds are normal. She exhibits no distension. There is no tenderness.  Musculoskeletal: Normal range of motion.  Neurological: She is alert and oriented to person, place, and time. No cranial nerve deficit.  Skin: Skin is warm and dry. No rash noted.  Psychiatric: Mood and affect normal.   LABORATORY PANEL:   CBC  Recent Labs Lab 10/22/16 0634  WBC 8.9  HGB 11.0*  HCT 32.9*  PLT 280   ------------------------------------------------------------------------------------------------------------------ Chemistries   Recent Labs Lab 10/16/16 1110  10/22/16 0634  NA 135  < > 137  K 3.9  < > 3.9  CL 98*  < > 103  CO2 27  < > 27  GLUCOSE 145*  < > 137*  BUN 15  < > 20  CREATININE 1.04*  < > 1.57*  CALCIUM 8.7*  < > 9.0  MG 2.2  --   --   AST 40  --   --   ALT 29  --   --   ALKPHOS 74  --   --   BILITOT 0.5  --   --   < > = values in this interval not displayed. RADIOLOGY:  Dg Chest Portable 1 View  Result Date: 10/21/2016 CLINICAL DATA:  Weakness. EXAM: PORTABLE CHEST 1 VIEW COMPARISON:  10/16/2016, additional priors FINDINGS: Cardiomegaly, may have progressed from prior exam versus differences in technique. There is atherosclerosis of the thoracic aorta. Chronic interstitial opacities are similar to prior. No confluent airspace disease, pleural effusion or pneumothorax. IMPRESSION: Cardiomegaly, with possible progression from prior versus differences in technique. Chronic interstitial opacities, stable. Thoracic aortic atherosclerosis Electronically Signed   By: Jeb Levering M.D.   On: 10/21/2016 22:36   ASSESSMENT AND PLAN:   *  Symptomatic bradycardia - suspect this is likely iatrogenic due to her starting amiodarone. We will hold that medication for the time being, as well as other rate inhibiting medicines. Continue to monitor on telemetry,  - Appreciate cardiology consult  - HR still in 40s  * Atrial fibrillation ([place]) - holding rate controlling  medication on Coumadin with subtherapeutic INR 1.7  * Acute on chronic renal failure - continue IV fluids. avoid nephrotoxins. - Monitor renal function  *  chronic diastolic Heart failure with preserved ejection fraction (H[facility]) - monitor for any fluid overload, continue torsemide  * Hypertension - continue home meds   * Hyperlipidemia - continue pravastatin     All the records are reviewed and case discussed with Care Management/Social Worker. Management plans discussed with the patient, family and they are in agreement.  CODE STATUS: Full code  TOTAL TIME TAKING CARE OF THIS PATIENT: 25 minutes.   More than 50% of the time was spent in counseling/coordination of care: YES  POSSIBLE D/C IN 1-2 DAYS, DEPENDING ON CLINICAL CONDITION.   Max Sane M.D on 10/22/2016 at 4:35 PM  Between 7am to 6pm - Pager - [phone]  After 6pm go to [email or web address] - Proofreader  [facility]  Office  [phone]  CC: Primary care physician; Kirk Ruths., MD  Note: This dictation was prepared with Dragon dictation along with smaller phrase technology. Any transcriptional errors that result from this process are unintentional.

## 2016-10-22 NOTE — Progress Notes (Signed)
Arrival Method: via stretcher with ED tech Mental Orientation:  A&O Telemetry: [id number], verified by Corine Shelter, RN Skin: Intact, no breakdown noted, verified by Corine Shelter, RN IV: 22g right hand & 20g right AC Pain: none Safety Measures: Safety Fall Prevention Plan has been given, discussed & signed, non skid socks in place, bed alarm activated. 2A Orientation: Patient has been orientated to the room, unit & staff.   Orders have been reviewed & implemented. Will continue to monitor the patient. Call light has been placed within reach.  Georgian Co, RN

## 2016-10-22 NOTE — Progress Notes (Signed)
ANTICOAGULATION CONSULT NOTE - Initial Consult  Pharmacy Consult for warfarin Indication: AF  No Known Allergies  Patient Measurements: Height: 5\' 5"  (165.1 cm) Weight: 167 lb 11.2 oz (76.1 kg) IBW/kg (Calculated) : 57 Heparin Dosing Weight:   Vital Signs: Temp: 97.8 F (36.6 C) (01/17 0250) Temp Source: Oral (01/17 0250) BP: 129/54 (01/17 0426) Pulse Rate: 49 (01/17 0426)  Labs:  Recent Labs  10/21/16 2149 10/22/16 0634  HGB 12.4 11.0*  HCT 37.4 32.9*  PLT 352 280  LABPROT  --  20.8*  INR  --  1.77  CREATININE 1.97* 1.57*  TROPONINI 0.04*  --     Estimated Creatinine Clearance: 26.2 mL/min (by C-G formula based on SCr of 1.57 mg/dL (H)).   Medical History: Past Medical History:  Diagnosis Date  . (HFpEF) heart failure with preserved ejection fraction ([place])   . Atrial fibrillation and flutter ([place])   . Breast cancer ([place])   . CKD (chronic kidney disease), stage III   . Diabetes mellitus without complication ([place])   . Hyperlipidemia   . Hypertension   . Low back pain   . Menopausal and postmenopausal disorder   . PA (pernicious anemia)   . Thyroid disease   . Valvular heart disease     Medications:  Infusions:    Assessment: 88 yof cc bradycardia. Pharmacy consulted to dose VKA from home. Takes 3 mg po daily per PTA list. Pt was recently d/c on 1/13. Her INR was subtherapeutic on last admission 1/11 and no warfarin was given that evening. INR was subtherapeutic on d/c 1/13 1.38. Warfarin 3mg  daily was continued because pt was started on amiodarone and DI can increase INR. INR on this admission is 1.77. Amiodarone is on hold due to bradycardia. However amiodarone has a very long half life.   Goal of Therapy:  INR 2-3 Monitor platelets by anticoagulation protocol: Yes   Plan:  INR is subtherapeutic, however increasing from 1/13. Due to drug interaction with amiodarone (still present in pt system despite hold) will continue with home dose of 3mg  daily  and recheck in the AM.  Ramond Dial, Pharm.D., BCPS Clinical Pharmacist 10/22/2016,10:21 AM

## 2016-10-22 NOTE — Consult Note (Signed)
[facility] Cardiology  CARDIOLOGY CONSULT NOTE  Patient ID: Tammie Mcmahon MRN: [id number] DOB/AGE: 81/02/1928 81 y.o.  Admit date: 10/21/2016 Referring Physician Manuella Ghazi Primary Physician [facility] Primary Cardiologist Nehemiah Massed Reason for Consultation Bradycardia  HPI: 83 year old female referred for evaluation of bradycardia. The patient was recently hospitalized for atrial fibrillation with a rapid ventricular rate. She was discharged home on amiodarone 400 twice a day and metoprolol succinate 100 mg daily. Yesterday, she noted her heart rate was in the 30s paced on her oximeter. She presented to [facility] emergency room, where ECG revealed idioventricular rhythm at a rate of 35 bpm. Amiodarone and metoprolol were held. Today, the patient is in sinus bradycardia rate of 45-50 bpm, currently asymptomatic, without chest pain and shortness of breath presyncope or syncope.  Review of systems complete and found to be negative unless listed above     Past Medical History:  Diagnosis Date  . (HFpEF) heart failure with preserved ejection fraction ([place])   . Atrial fibrillation and flutter ([place])   . Breast cancer ([place])   . CKD (chronic kidney disease), stage III   . Diabetes mellitus without complication ([place])   . Hyperlipidemia   . Hypertension   . Low back pain   . Menopausal and postmenopausal disorder   . PA (pernicious anemia)   . Thyroid disease   . Valvular heart disease     Past Surgical History:  Procedure Laterality Date  . BREAST SURGERY    . GALLBLADDER SURGERY    . MASTECTOMY Left   . THYROIDECTOMY      Prescriptions Prior to Admission  Medication Sig Dispense Refill Last Dose  . amiodarone (PACERONE) 200 MG tablet Take 2 tablets (400 mg total) by mouth 2 (two) times daily. 60 tablet 0 10/21/2016 at Unknown time  . azelastine (ASTELIN) 0.1 % nasal spray Place 1 spray into both nostrils daily as needed for allergies.   0 prn at prn  . benzonatate (TESSALON) 200 MG capsule Take 1  capsule by mouth 3 (three) times daily as needed for cough.   0 prn at prn  . cholecalciferol (VITAMIN D) 1000 units tablet Take 1,000 Units by mouth daily.   10/21/2016 at Unknown time  . diclofenac sodium (VOLTAREN) 1 % GEL Apply topically 4 (four) times daily.   10/21/2016 at Unknown time  . DULoxetine (CYMBALTA) 60 MG capsule Take 60 mg by mouth daily.    10/21/2016 at Unknown time  . levothyroxine (SYNTHROID) 50 MCG tablet Take 1 tablet (50 mcg total) by mouth daily before breakfast. 30 tablet 0 10/21/2016 at Unknown time  . metoprolol succinate (TOPROL-XL) 100 MG 24 hr tablet Take 100 mg by mouth daily.  1 10/21/2016 at Unknown time  . omeprazole (PRILOSEC) 20 MG capsule Take 20 mg by mouth daily.   10/21/2016 at Unknown time  . potassium chloride (MICRO-K) 10 MEQ CR capsule Take 10 mEq by mouth 2 (two) times daily.    10/21/2016 at Unknown time  . pravastatin (PRAVACHOL) 40 MG tablet Take 40 mg by mouth at bedtime.    10/20/2016 at Unknown time  . torsemide (DEMADEX) 20 MG tablet Take 40 mg by mouth daily.   10/21/2016 at Unknown time  . warfarin (COUMADIN) 1 MG tablet Take 1 mg by mouth as directed. Taking 3mg  daily: 1mg  tablet and 2mg  tablet; per RN at Dr. Tonette Bihari office   10/21/2016 at Unknown time  . warfarin (COUMADIN) 2 MG tablet Take 2 mg by mouth daily. Taking 3mg  daily:  2mg  tablet and 1mg  tablet per RN at Dr. Tonette Bihari office   10/21/2016 at Unknown time   Social History   Social History  . Marital status: Widowed    Spouse name: N/A  . Number of children: N/A  . Years of education: N/A   Occupational History  . Not on file.   Social History Main Topics  . Smoking status: Never Smoker  . Smokeless tobacco: Current User    Types: Snuff  . Alcohol use No  . Drug use: No  . Sexual activity: Not on file   Other Topics Concern  . Not on file   Social History Narrative  . No narrative on file    Family History  Problem Relation Age of Onset  . Heart attack Father        Review of systems complete and found to be negative unless listed above      PHYSICAL EXAM  General: Well developed, well nourished, in no acute distress HEENT:  Normocephalic and atramatic Neck:  No JVD.  Lungs: Clear bilaterally to auscultation and percussion. Heart: HRRR . Normal S1 and S2 without gallops or murmurs.  Abdomen: Bowel sounds are positive, abdomen soft and non-tender  Msk:  Back normal, normal gait. Normal strength and tone for age. Extremities: No clubbing, cyanosis or edema.   Neuro: Alert and oriented X 3. Psych:  Good affect, responds appropriately  Labs:   Lab Results  Component Value Date   WBC 8.9 10/22/2016   HGB 11.0 (L) 10/22/2016   HCT 32.9 (L) 10/22/2016   MCV 89.0 10/22/2016   PLT 280 10/22/2016    Recent Labs Lab 10/16/16 1110  10/22/16 0634  NA 135  < > 137  K 3.9  < > 3.9  CL 98*  < > 103  CO2 27  < > 27  BUN 15  < > 20  CREATININE 1.04*  < > 1.57*  CALCIUM 8.7*  < > 9.0  PROT 6.9  --   --   BILITOT 0.5  --   --   ALKPHOS 74  --   --   ALT 29  --   --   AST 40  --   --   GLUCOSE 145*  < > 137*  < > = values in this interval not displayed. Lab Results  Component Value Date   CKTOTAL 58 10/25/2012   CKMB 1.4 10/25/2012   TROPONINI 0.04 (HH) 10/21/2016   No results found for: CHOL No results found for: HDL No results found for: LDLCALC No results found for: TRIG No results found for: CHOLHDL No results found for: LDLDIRECT    Radiology: Dg Chest 2 View  Result Date: 10/16/2016 CLINICAL DATA:  Syncope and tachycardia today. EXAM: CHEST  2 VIEW COMPARISON:  PA and lateral chest 10/10/2016. FINDINGS: There is cardiomegaly without edema. No consolidative process, pneumothorax or effusion. Aortic atherosclerosis noted. No acute bony abnormality. IMPRESSION: No acute disease. Cardiomegaly. Atherosclerosis. Electronically Signed   By: Inge Rise M.D.   On: 10/16/2016 12:18   Dg Chest 2 View  Result Date:  10/10/2016 CLINICAL DATA:  Atrial fibrillation symptoms beginning today. Chest congestion. EXAM: CHEST  2 VIEW COMPARISON:  Single-view of the chest 10/06/2016 and 09/09/2016. FINDINGS: There is cardiomegaly without edema. No consolidative process, pneumothorax or effusion. Aortic atherosclerosis is noted. Thoracolumbar scoliosis is seen. IMPRESSION: Cardiomegaly without acute disease. Atherosclerosis. Electronically Signed   By: Inge Rise M.D.   On: 10/10/2016 18:11  Ct Head Wo Contrast  Result Date: 10/06/2016 CLINICAL DATA:  Weakness. Feeling unwell since 1600 hours today. History of atrial fibrillation, breast cancer, diabetes, hypertension and hyperlipidemia. EXAM: CT HEAD WITHOUT CONTRAST TECHNIQUE: Contiguous axial images were obtained from the base of the skull through the vertex without intravenous contrast. COMPARISON:  MRI head September 10, 2016 FINDINGS: BRAIN: The ventricles and sulci are normal for age. No intraparenchymal hemorrhage, mass effect nor midline shift. Patchy supratentorial white matter hypodensities less than expected for patient's age less than expected, though non-specific are most compatible with chronic small vessel ischemic disease. Symmetric basal ganglia mineralization. No acute large vascular territory infarcts. No abnormal extra-axial fluid collections. Basal cisterns are patent. VASCULAR: Moderate calcific atherosclerosis of the carotid siphons. SKULL: No skull fracture. No significant scalp soft tissue swelling. 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 SINUSES/ORBITS: The mastoid air-cells and included paranasal sinuses are well-aerated. Status post bilateral ocular lens implants. The included ocular globes and orbital contents are non-suspicious. OTHER: Patient is edentulous. IMPRESSION: Negative CT HEAD for age. Electronically Signed   By: Elon Alas M.D.   On: 10/06/2016 22:51   Dg Chest Portable 1 View  Result Date:  10/21/2016 CLINICAL DATA:  Weakness. EXAM: PORTABLE CHEST 1 VIEW COMPARISON:  10/16/2016, additional priors FINDINGS: Cardiomegaly, may have progressed from prior exam versus differences in technique. There is atherosclerosis of the thoracic aorta. Chronic interstitial opacities are similar to prior. No confluent airspace disease, pleural effusion or pneumothorax. IMPRESSION: Cardiomegaly, with possible progression from prior versus differences in technique. Chronic interstitial opacities, stable. Thoracic aortic atherosclerosis Electronically Signed   By: Jeb Levering M.D.   On: 10/21/2016 22:36   Dg Chest Port 1 View  Result Date: 10/06/2016 CLINICAL DATA:  Weakness today. History of breast cancer, diabetes, heart failure. EXAM: PORTABLE CHEST 1 VIEW COMPARISON:  Chest radiograph September 09, 2016 FINDINGS: Cardiac silhouette is mildly enlarged an unchanged. Mediastinal silhouette is nonsuspicious. Chronic interstitial changes without pleural effusion or focal consolidation. No pneumothorax. Mild biapical pleural thickening. Osteopenia. Lower thoracic levoscoliosis. Soft tissue planes are nonsuspicious. IMPRESSION: Mild cardiomegaly and chronic interstitial changes. Electronically Signed   By: Elon Alas M.D.   On: 10/06/2016 22:54   Dg Abdomen Acute W/chest  Result Date: 10/13/2016 CLINICAL DATA:  Abdominal pain swollen shortness of breath. EXAM: DG ABDOMEN ACUTE W/ 1V CHEST COMPARISON:  Chest x-ray of October 10, 2014 FINDINGS: The lungs are adequately inflated. The interstitial markings are coarse and slightly more conspicuous today. The cardiac silhouette remains enlarged. The pulmonary vascularity is mildly prominent centrally. There is calcification in the wall of the aortic arch. There is no pleural effusion. Within the abdomen the colonic stool burden is moderate. There is a small amount of small bowel gas within normal caliber small bowel loops. There are surgical clips in the gallbladder  fossa. There is a moderate amount of gas within the stomach and there is gas and stool in the rectum. There is mild dextrocurvature of the lumbar spine. There is degenerative disc disease at multiple lumbar levels. IMPRESSION: Chronic bronchitic changes bilaterally. Mild cardiomegaly with central pulmonary vascular prominence may reflect low-grade compensated CHF. Thoracic aortic atherosclerosis. Moderately increase colonic stool burden may reflect constipation in the appropriate clinical setting. Small amount of gas within normal caliber small bowel loops is likely normal. There is no evidence of obstruction or perforation. Electronically Signed   By: David  Martinique M.D.   On: 10/13/2016 12:30   Ct Renal Stone Study  Result  Date: 10/16/2016 CLINICAL DATA:  Right lower quadrant pain EXAM: CT ABDOMEN AND PELVIS WITHOUT CONTRAST TECHNIQUE: Multidetector CT imaging of the abdomen and pelvis was performed following the standard protocol without IV contrast. COMPARISON:  02/20/2014 FINDINGS: Lower chest: Lung bases shows no acute abnormality. Small hiatal hernia. Axial image 1 there is partial visualized subcutaneous nodule in right lower chest wall measures about 1.3 cm. Pathologic lymph node cannot be excluded. Clinical correlation is necessary. Hepatobiliary: Unenhanced liver shows no biliary ductal dilatation. The patient is status post cholecystectomy. Pancreas: Significant atrophic fatty replaced pancreas. Spleen: Normal unenhanced spleen. Adrenals/Urinary Tract: No adrenal gland mass. Bilateral kidneys shows a lobulated contour. Mild bilateral renal cortical thinning probable due to atrophy. No nephrolithiasis. No hydronephrosis or hydroureter. No calcified ureteral calculi are noted. Stable chronic mild distension of the right renal pelvis. Nonspecific mild right perinephric stranding. No calcified calculi are noted within urinary bladder. No thickening of urinary bladder wall. No bladder filling defects are  noted. Stomach/Bowel: Study is limited without oral contrast. There is no small bowel obstruction. Abundant stool noted in right colon. No pericecal inflammation. Normal appendix partially visualized in coronal image 68. There is some fecal like material within terminal ileum probable incompetent ileocecal valve. Abundant stool noted within transverse colon. There is some colonic stool in descending colon. Moderate stool noted in rectosigmoid colon. Colonic diverticula are noted proximal sigmoid colon. There is no evidence of acute diverticulitis or colitis. No distal colonic obstruction. Vascular/Lymphatic: There is no adenopathy. Extensive atherosclerotic calcifications of abdominal aorta and iliac arteries. Reproductive: The uterus is atrophic.  No adnexal mass. Other: No ascites or free abdominal air. Nonspecific small bilateral inguinal lymph nodes are noted. Musculoskeletal: No destructive bony lesions are noted. There is dextroscoliosis of the lumbar spine. Sagittal images of the spine shows osteopenia and degenerative changes thoracolumbar spine. IMPRESSION: 1. Small hiatal hernia is noted. There is nonspecific subcutaneous nodule in right anterolateral chest wall axial image 1 partially visualized measures 1.3 cm. Clinical correlation is necessary to exclude borderline enlarged lymph node. 2. Status post cholecystectomy. Markedly atrophic fatty replaced pancreas. 3. No small bowel obstruction. 4. Abundant stool noted in right colon and transverse colon. No pericecal inflammation. Normal appendix is partially visualized. 5. Moderate stool noted in rectosigmoid colon. Colonic diverticula are noted proximal sigmoid colon. No evidence of acute diverticulitis. 6. No nephrolithiasis. No hydronephrosis or hydroureter. No calcified ureteral calculi are noted. No calcified calculi are noted within urinary bladder. 7. Dextroscoliosis lumbar spine. Degenerative changes thoracolumbar spine. Electronically Signed   By:  Lahoma Crocker M.D.   On: 10/16/2016 13:14    EKG: Idioventricular rhythm at a rate of 35 bpm  ASSESSMENT AND PLAN:   1. Idioventricular rhythm, iatrogenically induced by amiodarone and metoprolol succinate, now in sinus bradycardia, clinically and hemodynamically stable 2. Paroxysmal atrial fibrillation  Recommendations  1. Continue current therapy 2. Continue to hold amiodarone and metoprolol succinate for now 3. Defer permanent pacemaker implantation at this time 4. Will very likely need to readjust metoprolol and amiodarone dose prior to discharge   Signed: Isaias Cowman MD,PhD, [facility] 10/22/2016, 9:17 AM

## 2016-10-22 NOTE — H&P (Signed)
[facility] NAME: Tammie Mcmahon    MR#:  [id number]  DATE OF BIRTH:  04-07-1928  DATE OF ADMISSION:  10/21/2016  PRIMARY CARE PHYSICIAN: Kirk Ruths., MD   REQUESTING/REFERRING PHYSICIAN: Kerman Passey, MD  CHIEF COMPLAINT:   Chief Complaint  Patient presents with  . Bradycardia    HISTORY OF PRESENT ILLNESS:  Tammie Mcmahon  is a 83 y.o. female who presents with Symptomatic bradycardia. Patient was recently here in the hospital for A. fib with RVR and was started on amiodarone. The last several days at home she has been very fatigued. She finally came for evaluation was found to have a heart rate in the 30s. Other workup was largely negative. Hospitalists were called for admission and further evaluation.  PAST MEDICAL HISTORY:   Past Medical History:  Diagnosis Date  . (HFpEF) heart failure with preserved ejection fraction ([place])   . Atrial fibrillation and flutter ([place])   . Breast cancer ([place])   . CKD (chronic kidney disease), stage III   . Diabetes mellitus without complication ([place])   . Hyperlipidemia   . Hypertension   . Low back pain   . Menopausal and postmenopausal disorder   . PA (pernicious anemia)   . Thyroid disease   . Valvular heart disease     PAST SURGICAL HISTORY:   Past Surgical History:  Procedure Laterality Date  . BREAST SURGERY    . GALLBLADDER SURGERY    . MASTECTOMY Left   . THYROIDECTOMY      SOCIAL HISTORY:   Social History  Substance Use Topics  . Smoking status: Never Smoker  . Smokeless tobacco: Current User    Types: Snuff  . Alcohol use No    FAMILY HISTORY:   Family History  Problem Relation Age of Onset  . Heart attack Father     DRUG ALLERGIES:  No Known Allergies  MEDICATIONS AT HOME:   Prior to Admission medications   Medication Sig Start Date End Date Taking? Authorizing Provider  amiodarone (PACERONE) 200 MG tablet Take 2 tablets (400 mg total)  by mouth 2 (two) times daily. 10/18/16  Yes Sital Mody, MD  azelastine (ASTELIN) 0.1 % nasal spray Place 1 spray into both nostrils daily as needed for allergies.  09/30/16  Yes Historical Provider, MD  benzonatate (TESSALON) 200 MG capsule Take 1 capsule by mouth 3 (three) times daily as needed for cough.  09/30/16  Yes Historical Provider, MD  cholecalciferol (VITAMIN D) 1000 units tablet Take 1,000 Units by mouth daily.   Yes Historical Provider, MD  diclofenac sodium (VOLTAREN) 1 % GEL Apply topically 4 (four) times daily.   Yes Historical Provider, MD  DULoxetine (CYMBALTA) 60 MG capsule Take 60 mg by mouth daily.    Yes Historical Provider, MD  levothyroxine (SYNTHROID) 50 MCG tablet Take 1 tablet (50 mcg total) by mouth daily before breakfast. 09/15/16  Yes Epifanio Lesches, MD  metoprolol succinate (TOPROL-XL) 100 MG 24 hr tablet Take 100 mg by mouth daily. 09/18/16  Yes Historical Provider, MD  omeprazole (PRILOSEC) 20 MG capsule Take 20 mg by mouth daily.   Yes Historical Provider, MD  potassium chloride (MICRO-K) 10 MEQ CR capsule Take 10 mEq by mouth 2 (two) times daily.  04/10/14  Yes Historical Provider, MD  pravastatin (PRAVACHOL) 40 MG tablet Take 40 mg by mouth at bedtime.  05/19/14  Yes Historical Provider, MD  torsemide (DEMADEX) 20 MG tablet Take 40 mg  by mouth daily.   Yes Historical Provider, MD  warfarin (COUMADIN) 1 MG tablet Take 1 mg by mouth as directed. Taking 3mg  daily: 1mg  tablet and 2mg  tablet; per RN at Dr. Tonette Bihari office   Yes Historical Provider, MD  warfarin (COUMADIN) 2 MG tablet Take 2 mg by mouth daily. Taking 3mg  daily: 2mg  tablet and 1mg  tablet per RN at Dr. Tonette Bihari office   Yes Historical Provider, MD    REVIEW OF SYSTEMS:  Review of Systems  Constitutional: Positive for malaise/fatigue. Negative for chills, fever and weight loss.  HENT: Negative for ear pain, hearing loss and tinnitus.   Eyes: Negative for blurred vision, double vision, pain and  redness.  Respiratory: Negative for cough, hemoptysis and shortness of breath.   Cardiovascular: Negative for chest pain, palpitations, orthopnea and leg swelling.  Gastrointestinal: Negative for abdominal pain, constipation, diarrhea, nausea and vomiting.  Genitourinary: Negative for dysuria, frequency and hematuria.  Musculoskeletal: Negative for back pain, joint pain and neck pain.  Skin:       No acne, rash, or lesions  Neurological: Negative for dizziness, tremors, focal weakness and weakness.  Endo/Heme/Allergies: Negative for polydipsia. Does not bruise/bleed easily.  Psychiatric/Behavioral: Negative for depression. The patient is not nervous/anxious and does not have insomnia.      VITAL SIGNS:   Vitals:   10/21/16 2152 10/21/16 2154 10/21/16 2300 10/21/16 2330  BP:   115/62 (!) 130/57  Pulse: (!) 35 (!) 36 (!) 36 (!) 37  Resp: 13 14 15 10   Temp:      TempSrc:      SpO2: 99% 98% 99% 97%  Weight:      Height:       Wt Readings from Last 3 Encounters:  10/21/16 80.3 kg (177 lb)  10/16/16 74.8 kg (164 lb 14.4 oz)  10/10/16 77.1 kg (170 lb)    PHYSICAL EXAMINATION:  Physical Exam  Vitals reviewed. Constitutional: She is oriented to person, place, and time. She appears well-developed and well-nourished. No distress.  HENT:  Head: Normocephalic and atraumatic.  Mouth/Throat: Oropharynx is clear and moist.  Eyes: Conjunctivae and EOM are normal. Pupils are equal, round, and reactive to light. No scleral icterus.  Neck: Normal range of motion. Neck supple. No JVD present. No thyromegaly present.  Cardiovascular: Regular rhythm and intact distal pulses.  Exam reveals no gallop and no friction rub.   No murmur heard. Bradycardic  Respiratory: Effort normal and breath sounds normal. No respiratory distress. She has no wheezes. She has no rales.  GI: Soft. Bowel sounds are normal. She exhibits no distension. There is no tenderness.  Musculoskeletal: Normal range of motion.  She exhibits no edema.  No arthritis, no gout  Lymphadenopathy:    She has no cervical adenopathy.  Neurological: She is alert and oriented to person, place, and time. No cranial nerve deficit.  No dysarthria, no aphasia  Skin: Skin is warm and dry. No rash noted. No erythema.  Psychiatric: She has a normal mood and affect. Her behavior is normal. Judgment and thought content normal.    LABORATORY PANEL:   CBC  Recent Labs Lab 10/21/16 2149  WBC 11.6*  HGB 12.4  HCT 37.4  PLT 352   ------------------------------------------------------------------------------------------------------------------  Chemistries   Recent Labs Lab 10/16/16 1110  10/21/16 2149  NA 135  < > 136  K 3.9  < > 4.4  CL 98*  < > 101  CO2 27  < > 24  GLUCOSE 145*  < >  132*  BUN 15  < > 18  CREATININE 1.04*  < > 1.97*  CALCIUM 8.7*  < > 9.3  MG 2.2  --   --   AST 40  --   --   ALT 29  --   --   ALKPHOS 74  --   --   BILITOT 0.5  --   --   < > = values in this interval not displayed. ------------------------------------------------------------------------------------------------------------------  Cardiac Enzymes  Recent Labs Lab 10/21/16 2149  TROPONINI 0.04*   ------------------------------------------------------------------------------------------------------------------  RADIOLOGY:  Dg Chest Portable 1 View  Result Date: 10/21/2016 CLINICAL DATA:  Weakness. EXAM: PORTABLE CHEST 1 VIEW COMPARISON:  10/16/2016, additional priors FINDINGS: Cardiomegaly, may have progressed from prior exam versus differences in technique. There is atherosclerosis of the thoracic aorta. Chronic interstitial opacities are similar to prior. No confluent airspace disease, pleural effusion or pneumothorax. IMPRESSION: Cardiomegaly, with possible progression from prior versus differences in technique. Chronic interstitial opacities, stable. Thoracic aortic atherosclerosis Electronically Signed   By: Jeb Levering M.D.   On: 10/21/2016 22:36    EKG:   Orders placed or performed during the hospital encounter of 10/21/16  . ED EKG within 10 minutes  . ED EKG within 10 minutes  . EKG 12-Lead  . EKG 12-Lead    IMPRESSION AND PLAN:  Principal Problem:   Symptomatic bradycardia - suspect this is likely iatrogenic due to her starting amiodarone. We will hold that medication for the time being, as well as other rate inhibiting medicines. We'll monitor tonight on telemetry, get a cardiology consult morning. Active Problems:   Atrial fibrillation (HCC) - continue home meds, including anticoagulation, except for those were holding as above   Acute on chronic renal failure (HCC) - gentle IV fluids tonight, monitor for expected improvement, avoid nephrotoxins.   Heart failure with preserved ejection fraction ([place]) - continue home meds   Hypertension - continue home meds   Hyperlipidemia - home dose anti-lipid  All the records are reviewed and case discussed with ED provider. Management plans discussed with the patient and/or family.  DVT PROPHYLAXIS: Systemic anticoagulation  GI PROPHYLAXIS: None  ADMISSION STATUS: Observation  CODE STATUS: Full Code Status History    Date Active Date Inactive Code Status Order ID Comments User Context   10/16/2016  3:47 PM 10/18/2016  3:25 PM Full Code [id number]  Theodoro Grist, MD ED   09/10/2016  5:07 AM 09/15/2016  6:26 PM Full Code [id number]  Harrie Foreman, MD Inpatient      TOTAL TIME TAKING CARE OF THIS PATIENT: 45 minutes.    Tammie Mcmahon [place] 10/22/2016, 12:24 AM  [facility]  Office  [phone]  CC: Primary care physician; Kirk Ruths., MD

## 2016-10-22 NOTE — Progress Notes (Signed)
Initial Nutrition Assessment  DOCUMENTATION CODES:   Obesity unspecified  INTERVENTION:  1. Ensure Enlive po BID, each supplement provides 350 kcal and 20 grams of protein  NUTRITION DIAGNOSIS:   Unintentional weight loss related to poor appetite as evidenced by per patient/family report.  GOAL:   Patient will meet greater than or equal to 90% of their needs  MONITOR:   PO intake, I & O's, Labs, Weight trends, Supplement acceptance  REASON FOR ASSESSMENT:   Malnutrition Screening Tool    ASSESSMENT:   Tammie Mcmahon  is a 83 y.o. female who presents with Symptomatic bradycardia. Patient was recently here in the hospital for A. fib with RVR and was started on amiodarone. The last several days at home she has been very fatigued.  Spoke with Tammie Mcmahon at bedside. She reports ok appetite at home, was doing 2-3 medium sized meals daily. Reports stable weight around 175#, per chart she exhibits an 8#/4.5% insignificant wt loss over 5 months. Ate about 50% of her breakfast this morning - Pancakes, Fruit, Scrambled Eggs, Orange Juice and Coffee. Nutrition-Focused physical exam completed. Findings are no fat depletion, moderate muscle depletion, and no edema.   Labs and medications reviewed Warfarin, Snythroid  Diet Order:  Diet Heart Room service appropriate? Yes; Fluid consistency: Thin  Skin:  Reviewed, no issues  Last BM:  10/22/2016  Height:   Ht Readings from Last 1 Encounters:  10/21/16 5\' 5"  (1.651 m)    Weight:   Wt Readings from Last 1 Encounters:  10/22/16 167 lb 11.2 oz (76.1 kg)    Ideal Body Weight:  56.81 kg  BMI:  Body mass index is 27.91 kg/m.  Estimated Nutritional Needs:   Kcal:  1450-1560 calories (MSJ x1.2-1.3)  Protein:  75-90 grams  Fluid:  >/= 1.5L  EDUCATION NEEDS:   No education needs identified at this time  Tammie Anis. Neitzke, MS, RD LDN Inpatient Clinical Dietitian Pager [phone]

## 2016-10-22 NOTE — Care Management Obs Status (Signed)
[facility] NOTIFICATION   Patient Details  Name: Tammie Mcmahon MRN: [id number] Date of Birth: 28-Dec-1927   Medicare Observation Status Notification Given:  Yes    Katrina Stack, RN 10/22/2016, 12:51 PM

## 2016-10-22 NOTE — Progress Notes (Signed)
ANTICOAGULATION CONSULT NOTE - Initial Consult  Pharmacy Consult for warfarin Indication: AF  No Known Allergies  Patient Measurements: Height: 5\' 5"  (165.1 cm) Weight: 177 lb (80.3 kg) IBW/kg (Calculated) : 57 Heparin Dosing Weight:   Vital Signs: Temp: 97.5 F (36.4 C) (01/16 2145) Temp Source: Oral (01/16 2145) BP: 105/67 (01/17 0130) Pulse Rate: 37 (01/17 0130)  Labs:  Recent Labs  10/21/16 2149  HGB 12.4  HCT 37.4  PLT 352  CREATININE 1.97*  TROPONINI 0.04*    Estimated Creatinine Clearance: 21.5 mL/min (by C-G formula based on SCr of 1.97 mg/dL (H)).   Medical History: Past Medical History:  Diagnosis Date  . (HFpEF) heart failure with preserved ejection fraction ([place])   . Atrial fibrillation and flutter ([place])   . Breast cancer ([place])   . CKD (chronic kidney disease), stage III   . Diabetes mellitus without complication ([place])   . Hyperlipidemia   . Hypertension   . Low back pain   . Menopausal and postmenopausal disorder   . PA (pernicious anemia)   . Thyroid disease   . Valvular heart disease     Medications:  Infusions:    Assessment: 88 yof cc bradycardia. Pharmacy consulted to dose VKA from home. Takes 3 mg po daily per PTA list.  Goal of Therapy:  INR 2-3 Monitor platelets by anticoagulation protocol: Yes   Plan:  Baseline INR, then resume dosing. Takes warfarin 3 mg po daily at home.   Laural Benes, Pharm.D., BCPS Clinical Pharmacist 10/22/2016,2:35 AM

## 2016-10-23 DIAGNOSIS — R001 Bradycardia, unspecified: Secondary | ICD-10-CM | POA: Diagnosis not present

## 2016-10-23 LAB — BASIC METABOLIC PANEL
ANION GAP: 8 (ref 5–15)
BUN: 15 mg/dL (ref 6–20)
CHLORIDE: 102 mmol/L (ref 101–111)
CO2: 28 mmol/L (ref 22–32)
Calcium: 8.6 mg/dL — ABNORMAL LOW (ref 8.9–10.3)
Creatinine, Ser: 1.17 mg/dL — ABNORMAL HIGH (ref 0.44–1.00)
GFR calc non Af Amer: 41 mL/min — ABNORMAL LOW (ref >60)
GFR, EST AFRICAN AMERICAN: 47 mL/min — AB (ref >60)
Glucose, Bld: 95 mg/dL (ref 65–99)
Potassium: 3.7 mmol/L (ref 3.5–5.1)
Sodium: 138 mmol/L (ref 135–145)

## 2016-10-23 LAB — CBC
HEMATOCRIT: 32.1 % — AB (ref 35.0–47.0)
HEMOGLOBIN: 10.9 g/dL — AB (ref 12.0–16.0)
MCH: 30.2 pg (ref 26.0–34.0)
MCHC: 34 g/dL (ref 32.0–36.0)
MCV: 89 fL (ref 80.0–100.0)
Platelets: 262 10*3/uL (ref 150–440)
RBC: 3.61 MIL/uL — ABNORMAL LOW (ref 3.80–5.20)
RDW: 15.1 % — ABNORMAL HIGH (ref 11.5–14.5)
WBC: 12.9 10*3/uL — AB (ref 3.6–11.0)

## 2016-10-23 LAB — PROTIME-INR
INR: 1.95
Prothrombin Time: 22.5 seconds — ABNORMAL HIGH (ref 11.4–15.2)

## 2016-10-23 MED ORDER — LEVOTHYROXINE SODIUM 75 MCG PO TABS: 75.0000 ug | ORAL_TABLET | Freq: Every day | ORAL | 0 refills | Status: DC

## 2016-10-23 MED ORDER — METOPROLOL SUCCINATE ER 50 MG PO TB24: 50.0000 mg | ORAL_TABLET | Freq: Every day | ORAL | Status: DC

## 2016-10-23 MED ORDER — METOPROLOL SUCCINATE ER 50 MG PO TB24: 50.0000 mg | ORAL_TABLET | Freq: Every day | ORAL | 0 refills | Status: DC

## 2016-10-23 MED ORDER — LEVOTHYROXINE SODIUM 75 MCG PO TABS: 75.0000 ug | ORAL_TABLET | Freq: Every day | ORAL | Status: DC

## 2016-10-23 NOTE — Discharge Instructions (Signed)
Bradycardia, Adult Bradycardia is a slower-than-normal heartbeat. A normal resting heart rate for an adult ranges from 60 to 100 beats per minute. With bradycardia, the resting heart rate is less than 60 beats per minute. Bradycardia can prevent enough oxygen from reaching certain areas of your body when you are active. It can be serious if it keeps enough oxygen from reaching your brain and other parts of your body. Bradycardia is not a problem for everyone. For some healthy adults, a slow resting heart rate is normal. What are the causes? This condition may be caused by:  A problem with the heart, including:  A problem with the heart's electrical system, such as a heart block.  A problem with the heart's natural pacemaker (sinus node).  Heart disease.  A heart attack.  Heart damage.  A heart infection.  A heart condition that is present at birth (congenital heart defect).  Certain medicines that treat heart conditions.  Certain conditions, such as hypothyroidism and obstructive sleep apnea.  Problems with the balance of chemicals and other substances, like potassium, in the blood. What increases the risk? This condition is more likely to develop in adults who:  Are age 71 or older.  Have high blood pressure (hypertension), high cholesterol (hyperlipidemia), or diabetes.  Drink heavily, use tobacco or nicotine products, or use drugs.  Are stressed. What are the signs or symptoms? Symptoms of this condition include:  Light-headedness.  Feeling faint or fainting.  Fatigue and weakness.  Shortness of breath.  Chest pain (angina).  Drowsiness.  Confusion.  Dizziness. How is this diagnosed? This condition may be diagnosed based on:  Your symptoms.  Your medical history.  A physical exam. During the exam, your health care provider will listen to your heartbeat and check your pulse. To confirm the diagnosis, your health care provider may order tests, such  as:  Blood tests.  An electrocardiogram (ECG). This test records the heart's electrical activity. The test can show how fast your heart is beating and whether the heartbeat is steady.  A test in which you wear a portable device (event recorder or Holter monitor) to record your heart's electrical activity while you go about your day.  Anexercise test. How is this treated? Treatment for this condition depends on the cause of the condition and how severe your symptoms are. Treatment may involve:  Treatment of the underlying condition.  Changing your medicines or how much medicine you take.  Having a small, battery-operated device called a pacemaker implanted under the skin. When bradycardia occurs, this device can be used to increase your heart rate and help your heart to beat in a regular rhythm. Follow these instructions at home: Lifestyle  Manage any health conditions that contribute to bradycardia as told by your health care provider.  Follow a heart-healthy diet. A nutrition specialist (dietitian) can help to educate you about healthy food options and changes.  Follow an exercise program that is approved by your health care provider.  Maintain a healthy weight.  Try to reduce or manage your stress, such as with yoga or meditation. If you need help reducing stress, ask your health care provider.  Do not use use any products that contain nicotine or tobacco, such as cigarettes and e-cigarettes. If you need help quitting, ask your health care provider.  Do not use illegal drugs.  Limit alcohol intake to no more than 1 drink per day for nonpregnant women and 2 drinks per day for men. One drink equals 12  oz of beer, 5 oz of wine, or 1 oz of hard liquor. General instructions  Take over-the-counter and prescription medicines only as told by your health care provider.  Keep all follow-up visits as directed by your health care provider. This is important. How is this prevented? In  some cases, bradycardia may be prevented by:  Treating underlying medical problems.  Stopping behaviors or medicines that can trigger the condition. Contact a health care provider if:  You feel light-headed or dizzy.  You almost faint.  You feel weak or are easily fatigued during physical activity.  You experience confusion or have memory problems. Get help right away if:  You faint.  You have an irregular heartbeat (palpitations).  You have chest pain.  You have trouble breathing. This information is not intended to replace advice given to you by your health care provider. Make sure you discuss any questions you have with your health care provider. Document Released: 06/14/2002 Document Revised: 05/20/2016 Document Reviewed: 03/13/2016 Elsevier Interactive Patient Education  2017 [place] on my medicine - Coumadin   (Warfarin)  This medication education was reviewed with me or my healthcare representative as part of my discharge preparation.  The pharmacist that spoke with me during my hospital stay was:  Ramond Dial, [facility]  Why was Coumadin prescribed for you? Coumadin was prescribed for you because you have a blood clot or a medical condition that can cause an increased risk of forming blood clots. Blood clots can cause serious health problems by blocking the flow of blood to the heart, lung, or brain. Coumadin can prevent harmful blood clots from forming. As a reminder your indication for Coumadin is:   Stroke Prevention Because Of Atrial Fibrillation  What test will check on my response to Coumadin? While on Coumadin (warfarin) you will need to have an INR test regularly to ensure that your dose is keeping you in the desired range. The INR (international normalized ratio) number is calculated from the result of the laboratory test called prothrombin time (PT).  If an INR APPOINTMENT HAS NOT ALREADY BEEN MADE FOR YOU please schedule an appointment to  have this lab work done by your health care provider within 7 days. Your INR goal is usually a number between:  2 to 3 or your provider may give you a more narrow range like 2-2.5.  Ask your health care provider during an office visit what your goal INR is.  What  do you need to  know  About  COUMADIN? Take Coumadin (warfarin) exactly as prescribed by your healthcare provider about the same time each day.  DO NOT stop taking without talking to the doctor who prescribed the medication.  Stopping without other blood clot prevention medication to take the place of Coumadin may increase your risk of developing a new clot or stroke.  Get refills before you run out.  What do you do if you miss a dose? If you miss a dose, take it as soon as you remember on the same day then continue your regularly scheduled regimen the next day.  Do not take two doses of Coumadin at the same time.  Important Safety Information A possible side effect of Coumadin (Warfarin) is an increased risk of bleeding. You should call your healthcare provider right away if you experience any of the following: ? Bleeding from an injury or your nose that does not stop. ? Unusual colored urine (red or dark brown) or unusual colored stools (  red or black). ? Unusual bruising for unknown reasons. ? A serious fall or if you hit your head (even if there is no bleeding).  Some foods or medicines interact with Coumadin (warfarin) and might alter your response to warfarin. To help avoid this: ? Eat a balanced diet, maintaining a consistent amount of Vitamin K. ? Notify your provider about major diet changes you plan to make. ? Avoid alcohol or limit your intake to 1 drink for women and 2 drinks for men per day. (1 drink is 5 oz. wine, 12 oz. beer, or 1.5 oz. liquor.)  Make sure that ANY health care provider who prescribes medication for you knows that you are taking Coumadin (warfarin).  Also make sure the healthcare provider who is  monitoring your Coumadin knows when you have started a new medication including herbals and non-prescription products.  Coumadin (Warfarin)  Major Drug Interactions  Increased Warfarin Effect Decreased Warfarin Effect  Alcohol (large quantities) Antibiotics (esp. Septra/Bactrim, Flagyl, Cipro) Amiodarone (Cordarone) Aspirin (ASA) Cimetidine (Tagamet) Megestrol (Megace) NSAIDs (ibuprofen, naproxen, etc.) Piroxicam (Feldene) Propafenone (Rythmol SR) Propranolol (Inderal) Isoniazid (INH) Posaconazole (Noxafil) Barbiturates (Phenobarbital) Carbamazepine (Tegretol) Chlordiazepoxide (Librium) Cholestyramine (Questran) Griseofulvin Oral Contraceptives Rifampin Sucralfate (Carafate) Vitamin K   Coumadin (Warfarin) Major Herbal Interactions  Increased Warfarin Effect Decreased Warfarin Effect  Garlic Ginseng Ginkgo biloba Coenzyme Q10 Green tea St. Johns wort    Coumadin (Warfarin) FOOD Interactions  Eat a consistent number of servings per week of foods HIGH in Vitamin K (1 serving =  cup)  Collards (cooked, or boiled & drained) Kale (cooked, or boiled & drained) Mustard greens (cooked, or boiled & drained) Parsley *serving size only =  cup Spinach (cooked, or boiled & drained) Swiss chard (cooked, or boiled & drained) Turnip greens (cooked, or boiled & drained)  Eat a consistent number of servings per week of foods MEDIUM-HIGH in Vitamin K (1 serving = 1 cup)  Asparagus (cooked, or boiled & drained) Broccoli (cooked, boiled & drained, or raw & chopped) Brussel sprouts (cooked, or boiled & drained) *serving size only =  cup Lettuce, raw (green leaf, endive, romaine) Spinach, raw Turnip greens, raw & chopped   These websites have more information on Coumadin (warfarin):  FailFactory.se; VeganReport.com.au;

## 2016-10-23 NOTE — Plan of Care (Signed)
Problem: Safety: Goal: Ability to remain free from injury will improve Outcome: Progressing Pt continues to not score high enough for the bed alarm, however I have it on just since pt's heart rate has been low. BSC at bedside and pt has been up to use it, tolerating well.

## 2016-10-23 NOTE — Progress Notes (Signed)
Pt to be discharged to home today. Iv and tele removed. disch instructions and prescrip given to pt. Awaiting transport

## 2016-10-23 NOTE — Progress Notes (Signed)
ANTICOAGULATION CONSULT NOTE - Initial Consult  Pharmacy Consult for warfarin Indication: AF  No Known Allergies  Patient Measurements: Height: 5\' 5"  (165.1 cm) Weight: 169 lb 9.6 oz (76.9 kg) IBW/kg (Calculated) : 57 Heparin Dosing Weight:   Vital Signs: Temp: 98.5 F (36.9 C) (01/18 0800) Temp Source: Oral (01/18 0800) BP: 147/65 (01/18 0800) Pulse Rate: 54 (01/18 0800)  Labs:  Recent Labs  10/21/16 2149 10/22/16 0634 10/23/16 0637  HGB 12.4 11.0* 10.9*  HCT 37.4 32.9* 32.1*  PLT 352 280 262  LABPROT  --  20.8* 22.5*  INR  --  1.77 1.95  CREATININE 1.97* 1.57* 1.17*  TROPONINI 0.04*  --   --     Estimated Creatinine Clearance: 35.4 mL/min (by C-G formula based on SCr of 1.17 mg/dL (H)).   Medical History: Past Medical History:  Diagnosis Date  . (HFpEF) heart failure with preserved ejection fraction ([place])   . Atrial fibrillation and flutter ([place])   . Breast cancer ([place])   . CKD (chronic kidney disease), stage III   . Diabetes mellitus without complication ([place])   . Hyperlipidemia   . Hypertension   . Low back pain   . Menopausal and postmenopausal disorder   . PA (pernicious anemia)   . Thyroid disease   . Valvular heart disease     Medications:  Infusions:    Assessment: 88 yof cc bradycardia. Pharmacy consulted to dose VKA from home. Takes 3 mg po daily per PTA list. Pt was recently d/c on 1/13. Her INR was subtherapeutic on last admission 1/11 and no warfarin was given that evening. INR was subtherapeutic on d/c 1/13 1.38. Warfarin 3mg  daily was continued because pt was started on amiodarone and DI can increase INR. INR on this admission is 1.77. Amiodarone is on hold due to bradycardia. However amiodarone has a very long half life.   Goal of Therapy:  INR 2-3 Monitor platelets by anticoagulation protocol: Yes   Plan:  INR is just slightly subtherapeutic (1.95), however continues to increase. Due to drug interaction with amiodarone (still  present in pt system despite hold) will continue with home dose of 3mg  daily and recheck in the AM.  Ramond Dial, Pharm.D., BCPS Clinical Pharmacist 10/23/2016,9:08 AM

## 2016-10-23 NOTE — Progress Notes (Signed)
[facility] Cardiology  SUBJECTIVE: I feel much better   Vitals:   10/22/16 1055 10/22/16 1931 10/23/16 0402 10/23/16 0800  BP: (!) 147/62 140/75 (!) 146/64 (!) 147/65  Pulse: (!) 47 (!) 105 (!) 58 (!) 54  Resp: 18 18 18 16   Temp: 98 F (36.7 C) 98.6 F (37 C) 98.9 F (37.2 C) 98.5 F (36.9 C)  TempSrc: Oral Oral Oral Oral  SpO2: 96% 96% 93% 92%  Weight:   76.9 kg (169 lb 9.6 oz)   Height:         Intake/Output Summary (Last 24 hours) at 10/23/16 0920 Last data filed at 10/23/16 0402  Gross per 24 hour  Intake              640 ml  Output              450 ml  Net              190 ml      PHYSICAL EXAM  General: Well developed, well nourished, in no acute distress HEENT:  Normocephalic and atramatic Neck:  No JVD.  Lungs: Clear bilaterally to auscultation and percussion. Heart: HRRR . Normal S1 and S2 without gallops or murmurs.  Abdomen: Bowel sounds are positive, abdomen soft and non-tender  Msk:  Back normal, normal gait. Normal strength and tone for age. Extremities: No clubbing, cyanosis or edema.   Neuro: Alert and oriented X 3. Psych:  Good affect, responds appropriately   LABS: Basic Metabolic Panel:  Recent Labs  10/22/16 0634 10/23/16 0637  NA 137 138  K 3.9 3.7  CL 103 102  CO2 27 28  GLUCOSE 137* 95  BUN 20 15  CREATININE 1.57* 1.17*  CALCIUM 9.0 8.6*   Liver Function Tests: No results for input(s): AST, ALT, ALKPHOS, BILITOT, PROT, ALBUMIN in the last 72 hours. No results for input(s): LIPASE, AMYLASE in the last 72 hours. CBC:  Recent Labs  10/22/16 0634 10/23/16 0637  WBC 8.9 12.9*  HGB 11.0* 10.9*  HCT 32.9* 32.1*  MCV 89.0 89.0  PLT 280 262   Cardiac Enzymes:  Recent Labs  10/21/16 2149  TROPONINI 0.04*   BNP: Invalid input(s): POCBNP D-Dimer: No results for input(s): DDIMER in the last 72 hours. Hemoglobin A1C: No results for input(s): HGBA1C in the last 72 hours. Fasting Lipid Panel: No results for input(s): CHOL, HDL,  LDLCALC, TRIG, CHOLHDL, LDLDIRECT in the last 72 hours. Thyroid Function Tests:  Recent Labs  10/22/16 0634  TSH 12.856*   Anemia Panel: No results for input(s): VITAMINB12, FOLATE, FERRITIN, TIBC, IRON, RETICCTPCT in the last 72 hours.  Dg Chest Portable 1 View  Result Date: 10/21/2016 CLINICAL DATA:  Weakness. EXAM: PORTABLE CHEST 1 VIEW COMPARISON:  10/16/2016, additional priors FINDINGS: Cardiomegaly, may have progressed from prior exam versus differences in technique. There is atherosclerosis of the thoracic aorta. Chronic interstitial opacities are similar to prior. No confluent airspace disease, pleural effusion or pneumothorax. IMPRESSION: Cardiomegaly, with possible progression from prior versus differences in technique. Chronic interstitial opacities, stable. Thoracic aortic atherosclerosis Electronically Signed   By: Jeb Levering M.D.   On: 10/21/2016 22:36     Echo   TELEMETRY: Sinus bradycardia at 55-60 bpm:  ASSESSMENT AND PLAN:  Principal Problem:   Symptomatic bradycardia Active Problems:   Atrial fibrillation (HCC)   Heart failure with preserved ejection fraction (HCC)   Hypertension   Hyperlipidemia   Acute on chronic renal failure ([place])  1. Idioventricular rhythm, secondary to amiodarone and metoprolol succinate, in the setting of hypothyroidism, now in sinus bradycardia at a rate of 55-60 bpm 2. Paroxysmal atrial fibrillation  Recommendations  1. Agree with overall current therapy 2. Resume metoprolol succinate at lower dose 3. Continue to hold amiodarone for now 4. Up titrate thyroid replacement therapy   Isaias Cowman, MD, PhD, [facility] 10/23/2016 9:20 AM

## 2016-10-25 NOTE — Discharge Summary (Signed)
[facility] NAME: Tammie Mcmahon    MR#:  [id number]  DATE OF BIRTH:  09-28-1928  DATE OF ADMISSION:  10/21/2016   ADMITTING PHYSICIAN: Lance Coon, MD  DATE OF DISCHARGE: 10/23/2016  3:36 PM  PRIMARY CARE PHYSICIAN: Kirk Ruths., MD   ADMISSION DIAGNOSIS:  Symptomatic bradycardia [R00.1] DISCHARGE DIAGNOSIS:  Principal Problem:   Symptomatic bradycardia Active Problems:   Atrial fibrillation (HCC)   Heart failure with preserved ejection fraction (HCC)   Hypertension   Hyperlipidemia   Acute on chronic renal failure ([place])  SECONDARY DIAGNOSIS:   Past Medical History:  Diagnosis Date  . (HFpEF) heart failure with preserved ejection fraction ([place])   . Atrial fibrillation and flutter ([place])   . Breast cancer ([place])   . CKD (chronic kidney disease), stage III   . Diabetes mellitus without complication ([place])   . Hyperlipidemia   . Hypertension   . Low back pain   . Menopausal and postmenopausal disorder   . PA (pernicious anemia)   . Thyroid disease   . Valvular heart disease    HOSPITAL COURSE:   * Symptomatic bradycardia - resolved/improved. Likely due to overmedication (rate controlling) and hypothyroidism (TSH high suggestive of not getting enough levothyroxine - increased dose from 50 mcg to 75 mcg) - stopped metoprolol and reduced dose of amiodarone  *Atrial fibrillation (HCC) - reduced dose of rate controlling medication. Continue Coumadin   *Acute on chronic renal failure 2 - back to baseline with hydration   * chronic diastolic Heart failure with preserved ejection fraction ([place]) - continue torsemide  DISCHARGE CONDITIONS:  stable CONSULTS OBTAINED:  Treatment Team:  Isaias Cowman, MD DRUG ALLERGIES:  No Known Allergies DISCHARGE MEDICATIONS:   Allergies as of 10/23/2016   No Known Allergies     Medication List    STOP taking these medications   amiodarone 200 MG  tablet Commonly known as:  PACERONE     TAKE these medications   azelastine 0.1 % nasal spray Commonly known as:  ASTELIN Place 1 spray into both nostrils daily as needed for allergies.   benzonatate 200 MG capsule Commonly known as:  TESSALON Take 1 capsule by mouth 3 (three) times daily as needed for cough.   cholecalciferol 1000 units tablet Commonly known as:  VITAMIN D Take 1,000 Units by mouth daily.   diclofenac sodium 1 % Gel Commonly known as:  VOLTAREN Apply topically 4 (four) times daily.   DULoxetine 60 MG capsule Commonly known as:  CYMBALTA Take 60 mg by mouth daily.   levothyroxine 75 MCG tablet Commonly known as:  SYNTHROID, LEVOTHROID Take 1 tablet (75 mcg total) by mouth daily before breakfast. What changed:  medication strength  how much to take   metoprolol succinate 50 MG 24 hr tablet Commonly known as:  TOPROL-XL Take 1 tablet (50 mg total) by mouth daily. Take with or immediately following a meal. What changed:  medication strength  how much to take  additional instructions   omeprazole 20 MG capsule Commonly known as:  PRILOSEC Take 20 mg by mouth daily.   potassium chloride 10 MEQ CR capsule Commonly known as:  MICRO-K Take 10 mEq by mouth 2 (two) times daily.   pravastatin 40 MG tablet Commonly known as:  PRAVACHOL Take 40 mg by mouth at bedtime.   torsemide 20 MG tablet Commonly known as:  DEMADEX Take 40 mg by mouth daily.   warfarin 2 MG tablet  Commonly known as:  COUMADIN Take 2 mg by mouth daily. Taking 3mg  daily: 2mg  tablet and 1mg  tablet per RN at Dr. Tonette Bihari office   warfarin 1 MG tablet Commonly known as:  COUMADIN Take 1 mg by mouth as directed. Taking 3mg  daily: 1mg  tablet and 2mg  tablet; per RN at Dr. Tonette Bihari office        DISCHARGE INSTRUCTIONS:   DIET:  Regular diet DISCHARGE CONDITION:  Good ACTIVITY:  Activity as tolerated OXYGEN:  Home Oxygen: No.  Oxygen Delivery: room air DISCHARGE  LOCATION:  home   If you experience worsening of your admission symptoms, develop shortness of breath, life threatening emergency, suicidal or homicidal thoughts you must seek medical attention immediately by calling 911 or calling your MD immediately  if symptoms less severe.  You Must read complete instructions/literature along with all the possible adverse reactions/side effects for all the Medicines you take and that have been prescribed to you. Take any new Medicines after you have completely understood and accpet all the possible adverse reactions/side effects.   Please note  You were cared for by a hospitalist during your hospital stay. If you have any questions about your discharge medications or the care you received while you were in the hospital after you are discharged, you can call the unit and asked to speak with the hospitalist on call if the hospitalist that took care of you is not available. Once you are discharged, your primary care physician will handle any further medical issues. Please note that NO REFILLS for any discharge medications will be authorized once you are discharged, as it is imperative that you return to your primary care physician (or establish a relationship with a primary care physician if you do not have one) for your aftercare needs so that they can reassess your need for medications and monitor your lab values.    On the day of Discharge:  VITAL SIGNS:  Blood pressure (!) 142/63, pulse (!) 56, temperature 98.5 F (36.9 C), temperature source Oral, resp. rate 14, height 5\' 5"  (1.651 m), weight 76.9 kg (169 lb 9.6 oz), SpO2 98 %. PHYSICAL EXAMINATION:  GENERAL:  83 y.o.-year-old patient lying in the bed with no acute distress.  EYES: Pupils equal, round, reactive to light and accommodation. No scleral icterus. Extraocular muscles intact.  HEENT: Head atraumatic, normocephalic. Oropharynx and nasopharynx clear.  NECK:  Supple, no jugular venous distention. No  thyroid enlargement, no tenderness.  LUNGS: Normal breath sounds bilaterally, no wheezing, rales,rhonchi or crepitation. No use of accessory muscles of respiration.  CARDIOVASCULAR: S1, S2 normal. No murmurs, rubs, or gallops.  ABDOMEN: Soft, non-tender, non-distended. Bowel sounds present. No organomegaly or mass.  EXTREMITIES: No pedal edema, cyanosis, or clubbing.  NEUROLOGIC: Cranial nerves II through XII are intact. Muscle strength 5/5 in all extremities. Sensation intact. Gait not checked.  PSYCHIATRIC: The patient is alert and oriented x 3.  SKIN: No obvious rash, lesion, or ulcer.  DATA REVIEW:   CBC  Recent Labs Lab 10/23/16 0637  WBC 12.9*  HGB 10.9*  HCT 32.1*  PLT 262    Chemistries   Recent Labs Lab 10/23/16 0637  NA 138  K 3.7  CL 102  CO2 28  GLUCOSE 95  BUN 15  CREATININE 1.17*  CALCIUM 8.6*    Follow-up Information    Kirk Ruths., MD. Schedule an appointment as soon as possible for a visit in 1 week(s).   Specialty:  Internal Medicine Why:  You will need  to call for an appointment, ccs Contact information: [facility] [address] [place] [address] [phone]        Isaias Cowman, MD. Schedule an appointment as soon as possible for a visit in 2 week(s).   Specialty:  Cardiology Why:  You will need to call for an appointment, ccs Contact information: [facility] [place] [address] [phone]           Management plans discussed with the patient, family and they are in agreement.  CODE STATUS: FULL CODE  TOTAL TIME TAKING CARE OF THIS PATIENT: 45 minutes.    Max Sane M.D on 10/25/2016 at 7:33 PM  Between 7am to 6pm - Pager - [phone]  After 6pm go to [email or web address] - Proofreader  [facility]  Office  [phone]  CC: Primary care physician; Kirk Ruths., MD   Note: This dictation was  prepared with Dragon dictation along with smaller phrase technology. Any transcriptional errors that result from this process are unintentional.

## 2017-01-03 ENCOUNTER — Emergency Department: Payer: Medicare Other

## 2017-01-03 ENCOUNTER — Emergency Department
Admission: EM | Admit: 2017-01-03 | Discharge: 2017-01-03 | Disposition: A | Discharge status: Home / Self Care | Payer: Medicare Other | Source: Home / Self Care | Attending: Emergency Medicine | Admitting: Emergency Medicine

## 2017-01-03 DIAGNOSIS — W19XXXA Unspecified fall, initial encounter: Secondary | ICD-10-CM

## 2017-01-03 DIAGNOSIS — I129 Hypertensive chronic kidney disease with stage 1 through stage 4 chronic kidney disease, or unspecified chronic kidney disease: Secondary | ICD-10-CM | POA: Insufficient documentation

## 2017-01-03 DIAGNOSIS — Z853 Personal history of malignant neoplasm of breast: Secondary | ICD-10-CM

## 2017-01-03 DIAGNOSIS — Y9301 Activity, walking, marching and hiking: Secondary | ICD-10-CM

## 2017-01-03 DIAGNOSIS — Y92009 Unspecified place in unspecified non-institutional (private) residence as the place of occurrence of the external cause: Secondary | ICD-10-CM | POA: Insufficient documentation

## 2017-01-03 DIAGNOSIS — S0990XA Unspecified injury of head, initial encounter: Secondary | ICD-10-CM | POA: Insufficient documentation

## 2017-01-03 DIAGNOSIS — S72011A Unspecified intracapsular fracture of right femur, initial encounter for closed fracture: Secondary | ICD-10-CM | POA: Diagnosis not present

## 2017-01-03 DIAGNOSIS — Y999 Unspecified external cause status: Secondary | ICD-10-CM | POA: Insufficient documentation

## 2017-01-03 DIAGNOSIS — N183 Chronic kidney disease, stage 3 (moderate): Secondary | ICD-10-CM | POA: Insufficient documentation

## 2017-01-03 DIAGNOSIS — M25551 Pain in right hip: Secondary | ICD-10-CM

## 2017-01-03 DIAGNOSIS — F17290 Nicotine dependence, other tobacco product, uncomplicated: Secondary | ICD-10-CM | POA: Insufficient documentation

## 2017-01-03 DIAGNOSIS — W0110XA Fall on same level from slipping, tripping and stumbling with subsequent striking against unspecified object, initial encounter: Secondary | ICD-10-CM | POA: Insufficient documentation

## 2017-01-03 DIAGNOSIS — R55 Syncope and collapse: Secondary | ICD-10-CM | POA: Diagnosis not present

## 2017-01-03 DIAGNOSIS — E1122 Type 2 diabetes mellitus with diabetic chronic kidney disease: Secondary | ICD-10-CM | POA: Insufficient documentation

## 2017-01-03 NOTE — ED Triage Notes (Signed)
Pt presents via [facility] from her neices house. Pt walked to mailbox and tripped on curve, falling on to grass. Denies LOC. States R posterior head and R hip pain. Alert, oriented. Talking in complete sentences. States head pain is gone, only c/o R hip pain. No shortening or rotation noted, able to move R leg.  Small knot noted to posterior R head. Hx HTN and a fib. Takes blood thinner- coumadin.

## 2017-01-03 NOTE — Discharge Instructions (Signed)
Fortunately today your CT scan and x-ray were normal. Return to the emergency department for any concerns and follow up with yiour primary care physician as needed.  It was a pleasure to take care of you today, and thank you for coming to our emergency department.  If you have any questions or concerns before leaving please ask the nurse to grab me and I'm more than happy to go through your aftercare instructions again.  If you were prescribed any opioid pain medication today such as Norco, Vicodin, Percocet, morphine, hydrocodone, or oxycodone please make sure you do not drive when you are taking this medication as it can alter your ability to drive safely.  If you have any concerns once you are home that you are not improving or are in fact getting worse before you can make it to your follow-up appointment, please do not hesitate to call 911 and come back for further evaluation.  Darel Hong MD   Ct Head Wo Contrast  Result Date: 01/03/2017 CLINICAL DATA:  Status post fall. Hit head on curb. Initial encounter. EXAM: CT HEAD WITHOUT CONTRAST TECHNIQUE: Contiguous axial images were obtained from the base of the skull through the vertex without intravenous contrast. COMPARISON:  CT of the head performed 10/06/2016 FINDINGS: Brain: No evidence of acute infarction, hemorrhage, hydrocephalus, extra-axial collection or mass lesion/mass effect. Prominence of the ventricles and sulci reflects mild to moderate cortical volume loss. Cerebellar atrophy is noted. Scattered periventricular white matter change likely reflects small vessel ischemic microangiopathy. The brainstem and fourth ventricle are within normal limits. The basal ganglia are unremarkable in appearance. The cerebral hemispheres demonstrate grossly normal gray-white differentiation. No mass effect or midline shift is seen. Vascular: No hyperdense vessel or unexpected calcification. Skull: There is no evidence of fracture; visualized osseous  structures are unremarkable in appearance. Sinuses/Orbits: The visualized portions of the orbits are within normal limits. The paranasal sinuses and mastoid air cells are well-aerated. Other: No significant soft tissue abnormalities are seen. IMPRESSION: 1. No evidence of traumatic intracranial injury or fracture. 2. Mild to moderate cortical volume loss and scattered small vessel ischemic microangiopathy. Next Electronically Signed   By: Garald Balding M.D.   On: 01/03/2017 18:37   Dg Hip Unilat With Pelvis 2-3 Views Right  Result Date: 01/03/2017 CLINICAL DATA:  Acute onset of right hip pain after tripping over curb and falling. Initial encounter. EXAM: DG HIP (WITH OR WITHOUT PELVIS) 2-3V RIGHT COMPARISON:  None. FINDINGS: There is no evidence of fracture or dislocation. Both femoral heads are seated normally within their respective acetabula. The proximal right femur appears intact. No significant degenerative change is appreciated. The sacroiliac joints are unremarkable in appearance. The visualized bowel gas pattern is grossly unremarkable in appearance. Scattered vascular calcifications are seen. IMPRESSION: 1. No evidence of fracture or dislocation. 2. Scattered vascular calcifications seen. Electronically Signed   By: Garald Balding M.D.   On: 01/03/2017 18:48

## 2017-01-03 NOTE — ED Provider Notes (Signed)
[facility] Emergency Department Provider Note  ____________________________________________   First MD Initiated Contact with Patient 01/03/17 1806     (approximate)  I have reviewed the triage vital signs and the nursing notes.   HISTORY  Chief Complaint Fall   HPI Tammie Mcmahon is a 83 y.o. female who comes to the emergency department after mechanical trip and fall with head trauma. She says she was walking at home tripped and fell struck the right side of her head and her right hip. She has moderate severity nonradiating pain in her right hip. She was able to get up and walk after the incident. She does take Coumadin. She denies loss of consciousness. She denies neck pain. She denies chest pain or shortness of breath. She denies palpations.   Past Medical History:  Diagnosis Date  . (HFpEF) heart failure with preserved ejection fraction ([place])   . Atrial fibrillation and flutter ([place])   . Breast cancer ([place])   . CKD (chronic kidney disease), stage III   . Diabetes mellitus without complication ([place])   . Hyperlipidemia   . Hypertension   . Low back pain   . Menopausal and postmenopausal disorder   . PA (pernicious anemia)   . Thyroid disease   . Valvular heart disease     Patient Active Problem List   Diagnosis Date Noted  . Symptomatic bradycardia 10/22/2016  . Atrial fibrillation ([place]) 10/22/2016  . Heart failure with preserved ejection fraction ([place]) 10/22/2016  . Hypertension 10/22/2016  . Hyperlipidemia 10/22/2016  . Acute on chronic renal failure ([place]) 10/22/2016  . Other specified cardiac arrhythmias ([place]) 10/16/2016  . Confusion 09/10/2016    Past Surgical History:  Procedure Laterality Date  . BREAST SURGERY    . GALLBLADDER SURGERY    . MASTECTOMY Left   . THYROIDECTOMY      Prior to Admission medications   Medication Sig Start Date End Date Taking? Authorizing Provider  azelastine (ASTELIN) 0.1 % nasal spray  Place 1 spray into both nostrils daily as needed for allergies.  09/30/16   Historical Provider, MD  benzonatate (TESSALON) 200 MG capsule Take 1 capsule by mouth 3 (three) times daily as needed for cough.  09/30/16   Historical Provider, MD  cholecalciferol (VITAMIN D) 1000 units tablet Take 1,000 Units by mouth daily.    Historical Provider, MD  diclofenac sodium (VOLTAREN) 1 % GEL Apply topically 4 (four) times daily.    Historical Provider, MD  DULoxetine (CYMBALTA) 60 MG capsule Take 60 mg by mouth daily.     Historical Provider, MD  levothyroxine (SYNTHROID, LEVOTHROID) 75 MCG tablet Take 1 tablet (75 mcg total) by mouth daily before breakfast. 10/24/16   Max Sane, MD  metoprolol succinate (TOPROL-XL) 50 MG 24 hr tablet Take 1 tablet (50 mg total) by mouth daily. Take with or immediately following a meal. 10/23/16   Max Sane, MD  omeprazole (PRILOSEC) 20 MG capsule Take 20 mg by mouth daily.    Historical Provider, MD  potassium chloride (MICRO-K) 10 MEQ CR capsule Take 10 mEq by mouth 2 (two) times daily.  04/10/14   Historical Provider, MD  pravastatin (PRAVACHOL) 40 MG tablet Take 40 mg by mouth at bedtime.  05/19/14   Historical Provider, MD  torsemide (DEMADEX) 20 MG tablet Take 40 mg by mouth daily.    Historical Provider, MD  warfarin (COUMADIN) 1 MG tablet Take 1 mg by mouth as directed. Taking 3mg  daily: 1mg  tablet and 2mg   tablet; per RN at Dr. Tonette Bihari office    Historical Provider, MD  warfarin (COUMADIN) 2 MG tablet Take 2 mg by mouth daily. Taking 3mg  daily: 2mg  tablet and 1mg  tablet per RN at Dr. Tonette Bihari office    Historical Provider, MD    Allergies Patient has no known allergies.  Family History  Problem Relation Age of Onset  . Heart attack Father     Social History Social History  Substance Use Topics  . Smoking status: Never Smoker  . Smokeless tobacco: Current User    Types: Snuff  . Alcohol use No    Review of Systems Constitutional: No  fever/chills Eyes: No visual changes. ENT: No sore throat. Cardiovascular: Denies chest pain. Respiratory: Denies shortness of breath. Gastrointestinal: No abdominal pain.  No nausea, no vomiting.  No diarrhea.  No constipation. Genitourinary: Negative for dysuria. Musculoskeletal: Negative for back pain. Skin: Negative for rash. Neurological: Negative for headaches, focal weakness or numbness.  10-point ROS otherwise negative.  ____________________________________________   PHYSICAL EXAM:  VITAL SIGNS: ED Triage Vitals  Enc Vitals Group     BP --      Pulse Rate 01/03/17 1802 (!) 58     Resp 01/03/17 1802 18     Temp 01/03/17 1802 97.9 F (36.6 C)     Temp Source 01/03/17 1802 Oral     SpO2 01/03/17 1802 94 %     Weight --      Height 01/03/17 1803 5\' 4"  (1.626 m)     Head Circumference --      Peak Flow --      Pain Score 01/03/17 1801 6     Pain Loc --      Pain Edu? --      Excl. in [place]? --     Constitutional: Alert and oriented x 4 well appearing nontoxic no diaphoresis speaks in full, clear sentences Eyes: PERRL EOMI. Head: Atraumatic.No hematoma or lesions noted Nose: No congestion/rhinnorhea. Mouth/Throat: No trismus Neck: No stridor.   Cardiovascular: Cardiac rate, regular rhythm. Grossly normal heart sounds.  Good peripheral circulation. Respiratory: Normal respiratory effort.  No retractions. Lungs CTAB and moving good air Gastrointestinal: Soft nondistended nontender no rebound no guarding no peritonitis no McBurney's tenderness negative Rovsing's no costovertebral tenderness negative Murphy's Musculoskeletal: No bony tenderness or deformity full range of motion bilateral hips Neurologic:  Normal speech and language. No gross focal neurologic deficits are appreciated. Skin:  Skin is warm, dry and intact. No rash noted. Psychiatric: Mood and affect are normal. Speech and behavior are normal.    ____________________________________________    DIFFERENTIAL  History of all hemorrhage, hip fracture ____________________________________________   LABS (all labs ordered are listed, but only abnormal results are displayed)  Labs Reviewed - No data to display   __________________________________________  EKG   ____________________________________________  RADIOLOGY  CT scan a hip x-ray with no acute pathology ____________________________________________   PROCEDURES  Procedure(s) performed: no  Procedures  Critical Care performed: no  ____________________________________________   INITIAL IMPRESSION / ASSESSMENT AND PLAN / ED COURSE  Pertinent labs & imaging results that were available during my care of the patient were reviewed by me and considered in my medical decision making (see chart for details).  The patient arrives with a clear story from mechanical trip and fall with minor head trauma. She is on Coumadin head CT obtained which is fortunately negative for acute pathology. The patient's x-ray is also negative and she is able to ambulate. I  discussed the patient and family the possibility of a delayed bleed but she remains stable for discharge.      ____________________________________________   FINAL CLINICAL IMPRESSION(S) / ED DIAGNOSES  Final diagnoses:  Accident due to mechanical fall without injury, initial encounter      NEW MEDICATIONS STARTED DURING THIS VISIT:  New Prescriptions   No medications on file     Note:  This document was prepared using Dragon voice recognition software and may include unintentional dictation errors.     Darel Hong, MD 01/03/17 [phone]

## 2017-01-04 ENCOUNTER — Emergency Department: Payer: Medicare Other

## 2017-01-04 ENCOUNTER — Encounter: Payer: Self-pay | Admitting: Emergency Medicine

## 2017-01-04 ENCOUNTER — Observation Stay: Payer: Medicare Other

## 2017-01-04 ENCOUNTER — Inpatient Hospital Stay
Admission: EM | Admit: 2017-01-04 | Discharge: 2017-01-07 | DRG: 536 | Disposition: A | Discharge status: Skilled Nursing Facility | Payer: Medicare Other | Attending: Internal Medicine | Admitting: Internal Medicine

## 2017-01-04 DIAGNOSIS — R778 Other specified abnormalities of plasma proteins: Secondary | ICD-10-CM

## 2017-01-04 DIAGNOSIS — Z452 Encounter for adjustment and management of vascular access device: Secondary | ICD-10-CM

## 2017-01-04 DIAGNOSIS — I248 Other forms of acute ischemic heart disease: Secondary | ICD-10-CM | POA: Diagnosis present

## 2017-01-04 DIAGNOSIS — S7010XA Contusion of unspecified thigh, initial encounter: Secondary | ICD-10-CM | POA: Diagnosis present

## 2017-01-04 DIAGNOSIS — E1122 Type 2 diabetes mellitus with diabetic chronic kidney disease: Secondary | ICD-10-CM | POA: Diagnosis present

## 2017-01-04 DIAGNOSIS — E890 Postprocedural hypothyroidism: Secondary | ICD-10-CM | POA: Diagnosis present

## 2017-01-04 DIAGNOSIS — Z9049 Acquired absence of other specified parts of digestive tract: Secondary | ICD-10-CM

## 2017-01-04 DIAGNOSIS — I4892 Unspecified atrial flutter: Secondary | ICD-10-CM | POA: Diagnosis present

## 2017-01-04 DIAGNOSIS — R791 Abnormal coagulation profile: Secondary | ICD-10-CM | POA: Diagnosis present

## 2017-01-04 DIAGNOSIS — N179 Acute kidney failure, unspecified: Secondary | ICD-10-CM | POA: Diagnosis present

## 2017-01-04 DIAGNOSIS — Z8249 Family history of ischemic heart disease and other diseases of the circulatory system: Secondary | ICD-10-CM

## 2017-01-04 DIAGNOSIS — S300XXA Contusion of lower back and pelvis, initial encounter: Secondary | ICD-10-CM | POA: Diagnosis present

## 2017-01-04 DIAGNOSIS — Z79899 Other long term (current) drug therapy: Secondary | ICD-10-CM

## 2017-01-04 DIAGNOSIS — D62 Acute posthemorrhagic anemia: Secondary | ICD-10-CM | POA: Diagnosis present

## 2017-01-04 DIAGNOSIS — N189 Chronic kidney disease, unspecified: Secondary | ICD-10-CM

## 2017-01-04 DIAGNOSIS — Z853 Personal history of malignant neoplasm of breast: Secondary | ICD-10-CM

## 2017-01-04 DIAGNOSIS — N183 Chronic kidney disease, stage 3 unspecified: Secondary | ICD-10-CM

## 2017-01-04 DIAGNOSIS — F329 Major depressive disorder, single episode, unspecified: Secondary | ICD-10-CM | POA: Diagnosis present

## 2017-01-04 DIAGNOSIS — D72829 Elevated white blood cell count, unspecified: Secondary | ICD-10-CM

## 2017-01-04 DIAGNOSIS — Z9012 Acquired absence of left breast and nipple: Secondary | ICD-10-CM

## 2017-01-04 DIAGNOSIS — Z7901 Long term (current) use of anticoagulants: Secondary | ICD-10-CM

## 2017-01-04 DIAGNOSIS — K5900 Constipation, unspecified: Secondary | ICD-10-CM | POA: Diagnosis present

## 2017-01-04 DIAGNOSIS — R001 Bradycardia, unspecified: Secondary | ICD-10-CM | POA: Diagnosis present

## 2017-01-04 DIAGNOSIS — F17290 Nicotine dependence, other tobacco product, uncomplicated: Secondary | ICD-10-CM | POA: Diagnosis present

## 2017-01-04 DIAGNOSIS — M8580 Other specified disorders of bone density and structure, unspecified site: Secondary | ICD-10-CM | POA: Diagnosis present

## 2017-01-04 DIAGNOSIS — R300 Dysuria: Secondary | ICD-10-CM

## 2017-01-04 DIAGNOSIS — R7989 Other specified abnormal findings of blood chemistry: Secondary | ICD-10-CM

## 2017-01-04 DIAGNOSIS — S72011A Unspecified intracapsular fracture of right femur, initial encounter for closed fracture: Principal | ICD-10-CM | POA: Diagnosis present

## 2017-01-04 DIAGNOSIS — E876 Hypokalemia: Secondary | ICD-10-CM | POA: Diagnosis present

## 2017-01-04 DIAGNOSIS — M545 Low back pain: Secondary | ICD-10-CM | POA: Diagnosis present

## 2017-01-04 DIAGNOSIS — R55 Syncope and collapse: Secondary | ICD-10-CM | POA: Diagnosis present

## 2017-01-04 DIAGNOSIS — E785 Hyperlipidemia, unspecified: Secondary | ICD-10-CM | POA: Diagnosis present

## 2017-01-04 DIAGNOSIS — I480 Paroxysmal atrial fibrillation: Secondary | ICD-10-CM | POA: Diagnosis present

## 2017-01-04 DIAGNOSIS — I639 Cerebral infarction, unspecified: Secondary | ICD-10-CM

## 2017-01-04 DIAGNOSIS — E869 Volume depletion, unspecified: Secondary | ICD-10-CM | POA: Diagnosis not present

## 2017-01-04 DIAGNOSIS — R1030 Lower abdominal pain, unspecified: Secondary | ICD-10-CM

## 2017-01-04 DIAGNOSIS — E079 Disorder of thyroid, unspecified: Secondary | ICD-10-CM | POA: Diagnosis present

## 2017-01-04 DIAGNOSIS — I130 Hypertensive heart and chronic kidney disease with heart failure and stage 1 through stage 4 chronic kidney disease, or unspecified chronic kidney disease: Secondary | ICD-10-CM | POA: Diagnosis present

## 2017-01-04 DIAGNOSIS — D510 Vitamin B12 deficiency anemia due to intrinsic factor deficiency: Secondary | ICD-10-CM | POA: Diagnosis present

## 2017-01-04 DIAGNOSIS — W19XXXA Unspecified fall, initial encounter: Secondary | ICD-10-CM | POA: Diagnosis present

## 2017-01-04 LAB — URINALYSIS, COMPLETE (UACMP) WITH MICROSCOPIC
BILIRUBIN URINE: NEGATIVE
GLUCOSE, UA: NEGATIVE mg/dL
Ketones, ur: NEGATIVE mg/dL
LEUKOCYTES UA: NEGATIVE
Nitrite: NEGATIVE
PH: 5 (ref 5.0–8.0)
Protein, ur: NEGATIVE mg/dL
SPECIFIC GRAVITY, URINE: 1.012 (ref 1.005–1.030)

## 2017-01-04 LAB — CBC WITH DIFFERENTIAL/PLATELET
Basophils Absolute: 0.1 10*3/uL (ref 0–0.1)
Basophils Relative: 1 %
EOS PCT: 1 %
Eosinophils Absolute: 0.1 10*3/uL (ref 0–0.7)
HCT: 30.7 % — ABNORMAL LOW (ref 35.0–47.0)
Hemoglobin: 10 g/dL — ABNORMAL LOW (ref 12.0–16.0)
LYMPHS PCT: 19 %
Lymphs Abs: 2.1 10*3/uL (ref 1.0–3.6)
MCH: 29.7 pg (ref 26.0–34.0)
MCHC: 32.6 g/dL (ref 32.0–36.0)
MCV: 91 fL (ref 80.0–100.0)
Monocytes Absolute: 0.7 10*3/uL (ref 0.2–0.9)
Monocytes Relative: 6 %
NEUTROS PCT: 73 %
Neutro Abs: 8.5 10*3/uL — ABNORMAL HIGH (ref 1.4–6.5)
PLATELETS: 292 10*3/uL (ref 150–440)
RBC: 3.38 MIL/uL — AB (ref 3.80–5.20)
RDW: 16.6 % — ABNORMAL HIGH (ref 11.5–14.5)
WBC: 11.4 10*3/uL — AB (ref 3.6–11.0)

## 2017-01-04 LAB — COMPREHENSIVE METABOLIC PANEL
ALT: 13 U/L — AB (ref 14–54)
ANION GAP: 13 (ref 5–15)
AST: 37 U/L (ref 15–41)
Albumin: 3.8 g/dL (ref 3.5–5.0)
Alkaline Phosphatase: 47 U/L (ref 38–126)
BUN: 21 mg/dL — ABNORMAL HIGH (ref 6–20)
CALCIUM: 8.7 mg/dL — AB (ref 8.9–10.3)
CHLORIDE: 102 mmol/L (ref 101–111)
CO2: 22 mmol/L (ref 22–32)
CREATININE: 2 mg/dL — AB (ref 0.44–1.00)
GFR, EST AFRICAN AMERICAN: 25 mL/min — AB (ref >60)
GFR, EST NON AFRICAN AMERICAN: 21 mL/min — AB (ref >60)
Glucose, Bld: 238 mg/dL — ABNORMAL HIGH (ref 65–99)
Potassium: 3.4 mmol/L — ABNORMAL LOW (ref 3.5–5.1)
Sodium: 137 mmol/L (ref 135–145)
Total Bilirubin: 0.6 mg/dL (ref 0.3–1.2)
Total Protein: 6.4 g/dL — ABNORMAL LOW (ref 6.5–8.1)

## 2017-01-04 LAB — TROPONIN I
Troponin I: 0.04 ng/mL (ref <0.03)
Troponin I: 0.04 ng/mL (ref <0.03)
Troponin I: 0.04 ng/mL (ref <0.03)

## 2017-01-04 LAB — MAGNESIUM: Magnesium: 1.9 mg/dL (ref 1.7–2.4)

## 2017-01-04 LAB — PROTIME-INR
INR: 3.59
Prothrombin Time: 36.7 seconds — ABNORMAL HIGH (ref 11.4–15.2)

## 2017-01-04 LAB — TSH: TSH: 5.838 u[IU]/mL — ABNORMAL HIGH (ref 0.350–4.500)

## 2017-01-04 MED ORDER — ACETAMINOPHEN 650 MG RE SUPP: 650.0000 mg | Freq: Four times a day (QID) | RECTAL | Status: DC | PRN

## 2017-01-04 MED ORDER — ASPIRIN EC 81 MG PO TBEC
81.0000 mg | DELAYED_RELEASE_TABLET | Freq: Every day | ORAL | Status: DC
  Administered 2017-01-04 – 2017-01-07 (×4): 81 mg via ORAL
  Filled 2017-01-04 (×4): qty 1

## 2017-01-04 MED ORDER — ACETAMINOPHEN 325 MG PO TABS
650.0000 mg | ORAL_TABLET | Freq: Four times a day (QID) | ORAL | Status: DC | PRN
  Administered 2017-01-04: 325 mg via ORAL
  Filled 2017-01-04: qty 2

## 2017-01-04 MED ORDER — ENOXAPARIN SODIUM 30 MG/0.3ML ~~LOC~~ SOLN: 30.0000 mg | SUBCUTANEOUS | Status: DC

## 2017-01-04 MED ORDER — ONDANSETRON HCL 4 MG/2ML IJ SOLN
4.0000 mg | Freq: Four times a day (QID) | INTRAMUSCULAR | Status: DC | PRN
  Administered 2017-01-04: 4 mg via INTRAVENOUS
  Filled 2017-01-04: qty 2

## 2017-01-04 MED ORDER — SENNA 8.6 MG PO TABS
1.0000 | ORAL_TABLET | Freq: Two times a day (BID) | ORAL | Status: DC
Start: 2017-01-04 — End: 2017-01-07
  Administered 2017-01-04 – 2017-01-07 (×6): 8.6 mg via ORAL
  Filled 2017-01-04 (×6): qty 1

## 2017-01-04 MED ORDER — PRAVASTATIN SODIUM 40 MG PO TABS
40.0000 mg | ORAL_TABLET | Freq: Every day | ORAL | Status: DC
  Administered 2017-01-04 – 2017-01-06 (×3): 40 mg via ORAL
  Filled 2017-01-04 (×3): qty 1

## 2017-01-04 MED ORDER — POTASSIUM CHLORIDE 20 MEQ PO PACK
20.0000 meq | PACK | Freq: Once | ORAL | Status: AC
  Administered 2017-01-04: 20 meq via ORAL
  Filled 2017-01-04: qty 1

## 2017-01-04 MED ORDER — DICLOFENAC SODIUM 1 % TD GEL
4.0000 g | Freq: Four times a day (QID) | TRANSDERMAL | Status: DC | PRN
  Filled 2017-01-04: qty 100

## 2017-01-04 MED ORDER — AMIODARONE HCL 200 MG PO TABS
400.0000 mg | ORAL_TABLET | Freq: Every day | ORAL | Status: DC
  Administered 2017-01-04 – 2017-01-05 (×2): 400 mg via ORAL
  Filled 2017-01-04 (×2): qty 2

## 2017-01-04 MED ORDER — SODIUM CHLORIDE 0.9 % IV SOLN
INTRAVENOUS | Status: DC
  Administered 2017-01-04 – 2017-01-06 (×3): via INTRAVENOUS

## 2017-01-04 MED ORDER — IOPAMIDOL (ISOVUE-300) INJECTION 61%
15.0000 mL | INTRAVENOUS | Status: AC
  Administered 2017-01-04: 15 mL via ORAL

## 2017-01-04 MED ORDER — AZELASTINE HCL 0.1 % NA SOLN
1.0000 | Freq: Every day | NASAL | Status: DC | PRN
  Filled 2017-01-04: qty 30

## 2017-01-04 MED ORDER — SODIUM CHLORIDE 0.9 % IV SOLN
Freq: Once | INTRAVENOUS | Status: AC
Start: 2017-01-04 — End: 2017-01-04
  Administered 2017-01-04: 18:00:00 via INTRAVENOUS

## 2017-01-04 MED ORDER — LEVOTHYROXINE SODIUM 75 MCG PO TABS
75.0000 ug | ORAL_TABLET | Freq: Every day | ORAL | Status: DC
  Administered 2017-01-05 – 2017-01-07 (×3): 75 ug via ORAL
  Filled 2017-01-04 (×3): qty 1

## 2017-01-04 MED ORDER — SODIUM CHLORIDE 0.9% FLUSH: 3.0000 mL | Freq: Two times a day (BID) | INTRAVENOUS | Status: DC

## 2017-01-04 MED ORDER — VITAMIN D 1000 UNITS PO TABS
1000.0000 [IU] | ORAL_TABLET | Freq: Every day | ORAL | Status: DC
  Administered 2017-01-04 – 2017-01-07 (×4): 1000 [IU] via ORAL
  Filled 2017-01-04 (×4): qty 1

## 2017-01-04 MED ORDER — DULOXETINE HCL 30 MG PO CPEP
60.0000 mg | ORAL_CAPSULE | Freq: Every day | ORAL | Status: DC
  Administered 2017-01-04 – 2017-01-07 (×4): 60 mg via ORAL
  Filled 2017-01-04 (×4): qty 2

## 2017-01-04 MED ORDER — PANTOPRAZOLE SODIUM 40 MG PO TBEC
40.0000 mg | DELAYED_RELEASE_TABLET | Freq: Every day | ORAL | Status: DC
  Administered 2017-01-04 – 2017-01-07 (×4): 40 mg via ORAL
  Filled 2017-01-04 (×4): qty 1

## 2017-01-04 MED ORDER — BENZONATATE 100 MG PO CAPS: 200.0000 mg | ORAL_CAPSULE | Freq: Three times a day (TID) | ORAL | Status: DC | PRN

## 2017-01-04 MED ORDER — NITROGLYCERIN 2 % TD OINT
0.5000 [in_us] | TOPICAL_OINTMENT | Freq: Four times a day (QID) | TRANSDERMAL | Status: DC
  Administered 2017-01-04 – 2017-01-05 (×3): 0.5 [in_us] via TOPICAL
  Filled 2017-01-04 (×3): qty 1

## 2017-01-04 MED ORDER — ONDANSETRON HCL 4 MG PO TABS: 4.0000 mg | ORAL_TABLET | Freq: Four times a day (QID) | ORAL | Status: DC | PRN

## 2017-01-04 MED ORDER — DOCUSATE SODIUM 100 MG PO CAPS
100.0000 mg | ORAL_CAPSULE | Freq: Two times a day (BID) | ORAL | Status: DC
  Administered 2017-01-04 – 2017-01-07 (×6): 100 mg via ORAL
  Filled 2017-01-04 (×6): qty 1

## 2017-01-04 NOTE — Care Management Obs Status (Signed)
[place] NOTIFICATION   Patient Details  Name: Tammie Mcmahon MRN: [id number] Date of Birth: 13-Feb-1928   Medicare Observation Status Notification Given:  Yes [facility] letter given. Discussed with sister Tammie Mcmahon.)    Mardene Speak, RN 01/04/2017, 5:22 PM

## 2017-01-04 NOTE — Progress Notes (Signed)
Tammie Mcmahon responded to an OR for prayer. Pt is awake and alert but presents with stomach pain. Pt also is sad because she was unable to attend church services this morning. Pt asked for prayer of healing, which was provided. CH is available for follow up as needed.    01/04/17 1700  Clinical Encounter Type  Visited With Patient  Visit Type Initial;Spiritual support  Referral From Nurse  Consult/Referral To Chaplain  Spiritual Encounters  Spiritual Needs Prayer

## 2017-01-04 NOTE — ED Triage Notes (Signed)
Pt to ED via EMS from home c/o syncopal episode this morning while walking to the bathroom with family when patient went limp.  Patient here yesterday for same.  Patient presents A&Ox4, speaking in complete and coherent sentences, chest rise even and unlabored.  Patient also c/o mid lower abd pain, burning with urination.  Upon arrival patient needed to void.  This nurse and Waunita Schooner, medic assisted patient up and to bathroom and as soon as standing patient became limp and we assisted patient back to bed.  Patient became responsive and alert upon lying back in bed, unaware of episode.  MD notified.

## 2017-01-04 NOTE — ED Provider Notes (Signed)
[facility] Emergency Department Provider Note   ____________________________________________   First MD Initiated Contact with Patient 01/04/17 [phone]     (approximate)  I have reviewed the triage vital signs and the nursing notes.   HISTORY  Chief Complaint Loss of Consciousness    HPI Logan Vegh Leyh is a 83 y.o. female who was walking to the bathroom this morning and got limp and fell down. She complains of pain in the right hip and the left hip when she came here and also a little bit in the suprapubic area over the right pubic ramus. Nurse's report they were going to stand her up and she passed out again. It was completely unconscious on my exam patient plate of pain in the hips and the right pubic ramus area later on she complained of some mild abdominal pain as well.  Past Medical History:  Diagnosis Date  . (HFpEF) heart failure with preserved ejection fraction ([place])   . Atrial fibrillation and flutter ([place])   . Breast cancer ([place])   . CKD (chronic kidney disease), stage III   . Diabetes mellitus without complication ([place])   . Hyperlipidemia   . Hypertension   . Low back pain   . Menopausal and postmenopausal disorder   . PA (pernicious anemia)   . Thyroid disease   . Valvular heart disease     Patient Active Problem List   Diagnosis Date Noted  . Syncope 01/04/2017  . Lower abdominal pain 01/04/2017  . CKD (chronic kidney disease), stage III 01/04/2017  . Elevated troponin 01/04/2017  . Hypokalemia 01/04/2017  . Leukocytosis 01/04/2017  . Dysuria 01/04/2017  . Constipation 01/04/2017  . Symptomatic bradycardia 10/22/2016  . Atrial fibrillation ([place]) 10/22/2016  . Heart failure with preserved ejection fraction ([place]) 10/22/2016  . Hypertension 10/22/2016  . Hyperlipidemia 10/22/2016  . Acute on chronic renal failure ([place]) 10/22/2016  . Other specified cardiac arrhythmias (CODE) 10/16/2016  . Confusion 09/10/2016    Past  Surgical History:  Procedure Laterality Date  . BREAST SURGERY    . GALLBLADDER SURGERY    . MASTECTOMY Left   . THYROIDECTOMY      Prior to Admission medications   Medication Sig Start Date End Date Taking? Authorizing Provider  amiodarone (PACERONE) 200 MG tablet Take 400 mg by mouth daily. 12/26/16  Yes Historical Provider, MD  azelastine (ASTELIN) 0.1 % nasal spray Place 1 spray into both nostrils daily as needed for allergies.  09/30/16  Yes Historical Provider, MD  cholecalciferol (VITAMIN D) 1000 units tablet Take 1,000 Units by mouth daily.   Yes Historical Provider, MD  diclofenac sodium (VOLTAREN) 1 % GEL Apply topically 4 (four) times daily as needed.    Yes Historical Provider, MD  DULoxetine (CYMBALTA) 60 MG capsule Take 60 mg by mouth daily.    Yes Historical Provider, MD  levothyroxine (SYNTHROID, LEVOTHROID) 75 MCG tablet Take 1 tablet (75 mcg total) by mouth daily before breakfast. 10/24/16  Yes Vipul Manuella Ghazi, MD  omeprazole (PRILOSEC) 20 MG capsule Take 20 mg by mouth daily.   Yes Historical Provider, MD  potassium chloride (MICRO-K) 10 MEQ CR capsule Take 10 mEq by mouth daily.  04/10/14  Yes Historical Provider, MD  pravastatin (PRAVACHOL) 40 MG tablet Take 40 mg by mouth at bedtime.  05/19/14  Yes Historical Provider, MD  torsemide (DEMADEX) 20 MG tablet Take 40 mg by mouth daily.   Yes Historical Provider, MD  warfarin (COUMADIN) 1 MG tablet Take  1 mg by mouth as directed. Taking 3mg  daily: 1mg  tablet and 2mg  tablet; per RN at Dr. Tonette Bihari office   Yes Historical Provider, MD  warfarin (COUMADIN) 2 MG tablet Take 2 mg by mouth daily. Taking 3mg  daily: 2mg  tablet and 1mg  tablet per RN at Dr. Tonette Bihari office   Yes Historical Provider, MD  benzonatate (TESSALON) 200 MG capsule Take 1 capsule by mouth 3 (three) times daily as needed for cough.  09/30/16   Historical Provider, MD  metoprolol succinate (TOPROL-XL) 50 MG 24 hr tablet Take 1 tablet (50 mg total) by mouth daily. Take  with or immediately following a meal. Patient not taking: Reported on 01/04/2017 10/23/16   Max Sane, MD    Allergies Patient has no known allergies.  Family History  Problem Relation Age of Onset  . Heart attack Father     Social History Social History  Substance Use Topics  . Smoking status: Never Smoker  . Smokeless tobacco: Current User    Types: Snuff  . Alcohol use No    Review of Systems Constitutional: No fever/chills Eyes: No visual changes. ENT: No sore throat. Cardiovascular: Denies chest pain. Respiratory: Denies shortness of breath. Gastrointestinal: Initially No abdominal pain.  No nausea, no vomiting.  No diarrhea.  No constipation. Genitourinary: Negative for dysuria. Musculoskeletal: Negative for back pain. Skin: Negative for rash. Neurological: Negative for headaches, focal weakness or numbness.  10-point ROS otherwise negative.  ____________________________________________   PHYSICAL EXAM:  VITAL SIGNS: ED Triage Vitals  Enc Vitals Group     BP 01/04/17 0937 122/60     Pulse Rate 01/04/17 0937 (!) 55     Resp 01/04/17 0937 19     Temp 01/04/17 0937 97.8 F (36.6 C)     Temp Source 01/04/17 0937 Oral     SpO2 01/04/17 0937 97 %     Weight 01/04/17 0938 166 lb 3.6 oz (75.4 kg)     Height 01/04/17 0938 5\' 4"  (1.626 m)     Head Circumference --      Peak Flow --      Pain Score 01/04/17 0936 10     Pain Loc --      Pain Edu? --      Excl. in [place]? --     Constitutional: Alert and oriented. Well appearing and in no acute distress. Eyes: Conjunctivae are normal. PERRL. EOMI. Head: Atraumatic. Nose: No congestion/rhinnorhea. Mouth/Throat: Mucous membranes are moist.  Oropharynx non-erythematous. Neck: No stridor.   Cardiovascular: Normal rate, regular rhythm. Grossly normal heart sounds.  Good peripheral circulation. Respiratory: Normal respiratory effort.  No retractions. Lungs CTAB. Gastrointestinal: Soft and nontenderExcept for over the  right pubic ramus. No distention. No abdominal bruits. No CVA tenderness. Musculoskeletal: Both hips are tender to palpation right is worse than the left there is no other  lower extremity tenderness nor edema.  No joint effusions. Neurologic:  Normal speech and language. No gross focal neurologic deficits are appreciated.  Skin:  Skin is warm, dry and intact. She is pale however Psychiatric: Mood and affect are normal. Speech and behavior are normal.  ____________________________________________   LABS (all labs ordered are listed, but only abnormal results are displayed)  Labs Reviewed  PROTIME-INR - Abnormal; Notable for the following:       Result Value   Prothrombin Time 36.7 (*)    All other components within normal limits  COMPREHENSIVE METABOLIC PANEL - Abnormal; Notable for the following:    Potassium 3.4 (*)  Glucose, Bld 238 (*)    BUN 21 (*)    Creatinine, Ser 2.00 (*)    Calcium 8.7 (*)    Total Protein 6.4 (*)    ALT 13 (*)    GFR calc non Af Amer 21 (*)    GFR calc Af Amer 25 (*)    All other components within normal limits  TROPONIN I - Abnormal; Notable for the following:    Troponin I 0.04 (*)    All other components within normal limits  CBC WITH DIFFERENTIAL/PLATELET - Abnormal; Notable for the following:    WBC 11.4 (*)    RBC 3.38 (*)    Hemoglobin 10.0 (*)    HCT 30.7 (*)    RDW 16.6 (*)    Neutro Abs 8.5 (*)    All other components within normal limits  URINALYSIS, COMPLETE (UACMP) WITH MICROSCOPIC   ____________________________________________  EKG  EKG read and interpreted by me shows sinus bradycardia rate of 58 normal axis there are no acute ST-T wave changes that I can see. His reading minimal ST segment depression in multiple leads do not really see that. ____________________________________________  RADIOLOGY Study Result   CLINICAL DATA:  Syncopal episode this morning while walking to the bathroom with family when patient went limp.  Patient here yesterday for same.  EXAM: DG HIP (WITH OR WITHOUT PELVIS) 2-3V LEFT  COMPARISON:  None.  FINDINGS: Nondisplaced subcapital left femoral neck fracture.  Generalized osteopenia. Joint spaces are maintained. Lower lumbar spine spondylosis partially visualize. No aggressive lytic or sclerotic osseous lesion.  Peripheral vascular atherosclerotic disease.  IMPRESSION: Nondisplaced subcapital left femoral neck fracture.   Electronically Signed   By: Kathreen Devoid   On: 01/04/2017 10:11   Study Result   CLINICAL DATA:  Syncopal episode walking to the bathroom. Bilateral hip pain. Limping.  EXAM: DG HIP (WITH OR WITHOUT PELVIS) 2-3V RIGHT  COMPARISON:  None.  FINDINGS: No acute fracture or dislocation. Generalized osteopenia. Joint spaces are maintained. Lower lumbar spine spondylosis partially visualize. No aggressive lytic or sclerotic osseous lesion.  Peripheral vascular atherosclerotic disease.  IMPRESSION: 1.  No acute osseous injury of the right hip. 2. Given the patient's age and osteopenia, IF there is persistent clinical concern for an occult hip fracture, THEN a CT or MRI of the hip is recommended for increased sensitivity.   Electronically Signed   By: Kathreen Devoid   On: 01/04/2017 10:09    Study Result   CLINICAL DATA:  Headache and neck pain following a fall today.  EXAM: CT HEAD WITHOUT CONTRAST  CT CERVICAL SPINE WITHOUT CONTRAST  TECHNIQUE: Multidetector CT imaging of the head and cervical spine was performed following the standard protocol without intravenous contrast. Multiplanar CT image reconstructions of the cervical spine were also generated.  COMPARISON:  Head CT dated 01/03/2017, brain MR dated 09/10/2016 and cervical spine CT dated 04/05/2012.  FINDINGS: CT HEAD FINDINGS  Brain: Diffusely enlarged ventricles and subarachnoid spaces. Patchy white matter low density in both cerebral  hemispheres. No intracranial hemorrhage, mass lesion or CT evidence of acute infarction.  Vascular: No hyperdense vessel or unexpected calcification.  Skull: Normal. Negative for fracture or focal lesion.  Sinuses/Orbits: No acute finding.  Other: None.  CT CERVICAL SPINE FINDINGS  Alignment: Stable minimal anterolisthesis at the C2-3 and C6-7 levels.  Skull base and vertebrae: No acute fracture. No primary bone lesion or focal pathologic process.  Soft tissues and spinal canal: No prevertebral fluid or swelling. No  visible canal hematoma.  Disc levels: Stable moderate-to-marked disc space narrowing and mild anterior and posterior spur formation at the C3-4 and C4-5 levels. Moderate disc space narrowing at the C5-6 level with mild posterior spur formation without significant change. Mild anterior and posterior spur formation at the C6-7 level. Mild facet degenerative changes at multiple levels.  Upper chest: Clear lung apices.  Other: None.  IMPRESSION: 1. No skull fracture or intracranial hemorrhage. 2. No cervical spine fracture or traumatic subluxation. 3. Stable diffuse cerebral and cerebellar atrophy and chronic small vessel white matter ischemic changes. 4. Stable cervical spine degenerative changes.   Electronically Signed   By: Claudie Revering M.D.   On: 01/04/2017 10:15    udy Result   CLINICAL DATA:  Headache and neck pain following a fall today.  EXAM: CT HEAD WITHOUT CONTRAST  CT CERVICAL SPINE WITHOUT CONTRAST  TECHNIQUE: Multidetector CT imaging of the head and cervical spine was performed following the standard protocol without intravenous contrast. Multiplanar CT image reconstructions of the cervical spine were also generated.  COMPARISON:  Head CT dated 01/03/2017, brain MR dated 09/10/2016 and cervical spine CT dated 04/05/2012.  FINDINGS: CT HEAD FINDINGS  Brain: Diffusely enlarged ventricles and subarachnoid  spaces. Patchy white matter low density in both cerebral hemispheres. No intracranial hemorrhage, mass lesion or CT evidence of acute infarction.  Vascular: No hyperdense vessel or unexpected calcification.  Skull: Normal. Negative for fracture or focal lesion.  Sinuses/Orbits: No acute finding.  Other: None.  CT CERVICAL SPINE FINDINGS  Alignment: Stable minimal anterolisthesis at the C2-3 and C6-7 levels.  Skull base and vertebrae: No acute fracture. No primary bone lesion or focal pathologic process.  Soft tissues and spinal canal: No prevertebral fluid or swelling. No visible canal hematoma.  Disc levels: Stable moderate-to-marked disc space narrowing and mild anterior and posterior spur formation at the C3-4 and C4-5 levels. Moderate disc space narrowing at the C5-6 level with mild posterior spur formation without significant change. Mild anterior and posterior spur formation at the C6-7 level. Mild facet degenerative changes at multiple levels.  Upper chest: Clear lung apices.  Other: None.  IMPRESSION: 1. No skull fracture or intracranial hemorrhage. 2. No cervical spine fracture or traumatic subluxation. 3. Stable diffuse cerebral and cerebellar atrophy and chronic small vessel white matter ischemic changes. 4. Stable cervical spine degenerative changes.   Electronically Signed   By: Claudie Revering M.D.   On: 01/04/2017 10:15      ____________________________________________   PROCEDURES  Procedures  Critical Care performed:   ____________________________________________   INITIAL IMPRESSION / ASSESSMENT AND PLAN / ED COURSE  Pertinent labs & imaging results that were available during my care of the patient were reviewed by me and considered in my medical decision making (see chart for details).        ____________________________________________   FINAL CLINICAL IMPRESSION(S) / ED DIAGNOSES  Final diagnoses:  Syncope  and collapse  AKI (acute kidney injury) ([place])      NEW MEDICATIONS STARTED DURING THIS VISIT:  New Prescriptions   No medications on file     Note:  This document was prepared using Dragon voice recognition software and may include unintentional dictation errors.    Nena Polio, MD 01/04/17 [phone]

## 2017-01-04 NOTE — Progress Notes (Signed)
Attempted to obtain orthostatic vitals. Patient became very weak, nauseous, pale, spacey, complained of "not feeling right" upon standing. Patient placed back into bed, did not obtain standing BP. Medicated with PRN Zofran. Lying and sitting vitals in the flow sheet.

## 2017-01-04 NOTE — Progress Notes (Signed)
Patient admitted to room 260 from ED. VSS, a&o x3. Heart monitor 40-28 placed on patient, verified with CCMD. Head to toe skin assessment completed with Gildardo Pounds, RN - large hematoma to right hip/buttock, scattered bruising on arms and legs. Patient resting comfortably in bed with no complaints at this time.

## 2017-01-04 NOTE — ED Notes (Signed)
Used bladder scanner on patient; Patient is retaining at least 460ml of fluid. Patient unable to void at this time.

## 2017-01-04 NOTE — ED Notes (Signed)
Two unsuccessful attempts for in and out cath, MD made aware

## 2017-01-04 NOTE — ED Notes (Signed)
Pt removed from bedpan. Pt stated she feels like she needs to go and her abd is hurting, but she isn't able. RN notified.

## 2017-01-04 NOTE — H&P (Addendum)
[facility] [place] NAME: Tammie Mcmahon    MR#:  [id number]  DATE OF BIRTH:  July 21, 1928  DATE OF ADMISSION:  01/04/2017  PRIMARY CARE PHYSICIAN: Kirk Ruths., MD   REQUESTING/REFERRING PHYSICIAN:   CHIEF COMPLAINT:   Chief Complaint  Patient presents with  . Loss of Consciousness    HISTORY OF PRESENT ILLNESS: Tammie Mcmahon  is a 83 y.o. female with a known history of Diastolic CHF, atrial fibrillation, atrial flutter, CK D, diabetes, hypertension, hyperlipidemia, who presents to the hospital with complaints of passing out. Apparently patient was in emergency room yesterday after fall. She was discharged back home. She has not feel well today and passed out 2 at home as well as in emergency room. After passing out. She complained of abdominal pain for at least 2 days or longer, not able to provide much more history . Patient had a CT scan of her hip, head, neck, which were unremarkable. Hospitalist services were contacted for admission, this patient's kidney function deteriorated. CT of abdomen and pelvis are pending, urine analysis is pending. Patient admits of some dysuria symptoms, difficulty voiding, passing urine, constipation    PAST MEDICAL HISTORY:   Past Medical History:  Diagnosis Date  . (HFpEF) heart failure with preserved ejection fraction ([place])   . Atrial fibrillation and flutter ([place])   . Breast cancer ([place])   . CKD (chronic kidney disease), stage III   . Diabetes mellitus without complication ([place])   . Hyperlipidemia   . Hypertension   . Low back pain   . Menopausal and postmenopausal disorder   . PA (pernicious anemia)   . Thyroid disease   . Valvular heart disease     PAST SURGICAL HISTORY: Past Surgical History:  Procedure Laterality Date  . BREAST SURGERY    . GALLBLADDER SURGERY    . MASTECTOMY Left   . THYROIDECTOMY      SOCIAL HISTORY:  Social History  Substance Use Topics  . Smoking  status: Never Smoker  . Smokeless tobacco: Current User    Types: Snuff  . Alcohol use No    FAMILY HISTORY:  Family History  Problem Relation Age of Onset  . Heart attack Father     DRUG ALLERGIES: No Known Allergies  Review of Systems  Constitutional: Positive for weight loss. Negative for chills and fever.  HENT: Negative for congestion.   Eyes: Positive for blurred vision. Negative for double vision.  Respiratory: Positive for shortness of breath and wheezing. Negative for cough and sputum production.   Cardiovascular: Positive for palpitations. Negative for chest pain, orthopnea, leg swelling and PND.  Gastrointestinal: Positive for abdominal pain and constipation. Negative for blood in stool, diarrhea, nausea and vomiting.  Genitourinary: Positive for dysuria. Negative for frequency, hematuria and urgency.  Musculoskeletal: Negative for falls.  Neurological: Positive for loss of consciousness. Negative for dizziness, tremors, focal weakness and headaches.  Endo/Heme/Allergies: Does not bruise/bleed easily.  Psychiatric/Behavioral: Negative for depression. The patient does not have insomnia.     MEDICATIONS AT HOME:  Prior to Admission medications   Medication Sig Start Date End Date Taking? Authorizing Provider  amiodarone (PACERONE) 200 MG tablet Take 400 mg by mouth daily. 12/26/16  Yes Historical Provider, MD  azelastine (ASTELIN) 0.1 % nasal spray Place 1 spray into both nostrils daily as needed for allergies.  09/30/16  Yes Historical Provider, MD  cholecalciferol (VITAMIN D) 1000 units tablet Take 1,000 Units by mouth  daily.   Yes Historical Provider, MD  diclofenac sodium (VOLTAREN) 1 % GEL Apply topically 4 (four) times daily as needed.    Yes Historical Provider, MD  DULoxetine (CYMBALTA) 60 MG capsule Take 60 mg by mouth daily.    Yes Historical Provider, MD  levothyroxine (SYNTHROID, LEVOTHROID) 75 MCG tablet Take 1 tablet (75 mcg total) by mouth daily before  breakfast. 10/24/16  Yes Vipul Manuella Ghazi, MD  omeprazole (PRILOSEC) 20 MG capsule Take 20 mg by mouth daily.   Yes Historical Provider, MD  potassium chloride (MICRO-K) 10 MEQ CR capsule Take 10 mEq by mouth daily.  04/10/14  Yes Historical Provider, MD  pravastatin (PRAVACHOL) 40 MG tablet Take 40 mg by mouth at bedtime.  05/19/14  Yes Historical Provider, MD  torsemide (DEMADEX) 20 MG tablet Take 40 mg by mouth daily.   Yes Historical Provider, MD  warfarin (COUMADIN) 1 MG tablet Take 1 mg by mouth as directed. Taking 3mg  daily: 1mg  tablet and 2mg  tablet; per RN at Dr. Tonette Bihari office   Yes Historical Provider, MD  warfarin (COUMADIN) 2 MG tablet Take 2 mg by mouth daily. Taking 3mg  daily: 2mg  tablet and 1mg  tablet per RN at Dr. Tonette Bihari office   Yes Historical Provider, MD  benzonatate (TESSALON) 200 MG capsule Take 1 capsule by mouth 3 (three) times daily as needed for cough.  09/30/16   Historical Provider, MD  metoprolol succinate (TOPROL-XL) 50 MG 24 hr tablet Take 1 tablet (50 mg total) by mouth daily. Take with or immediately following a meal. Patient not taking: Reported on 01/04/2017 10/23/16   Max Sane, MD      PHYSICAL EXAMINATION:   VITAL SIGNS: Blood pressure (!) 142/61, pulse (!) 51, temperature 97.8 F (36.6 C), temperature source Oral, resp. rate 13, height 5\' 4"  (1.626 m), weight 75.4 kg (166 lb 3.6 oz), SpO2 (!) 89 %.  GENERAL:  83 y.o.-year-old patient lying in the bed In mild distress due to pain in abdomen, holding her lower abdomen.  EYES: Pupils equal, round, reactive to light and accommodation. No scleral icterus. Extraocular muscles intact.  HEENT: Head atraumatic, normocephalic. Oropharynx and nasopharynx clear.  NECK:  Supple, no jugular venous distention. No thyroid enlargement, no tenderness.  LUNGS: Normal breath sounds bilaterally, no wheezing, rales,rhonchi or crepitation. No use of accessory muscles of respiration.  CARDIOVASCULAR: S1, S2 . The rhythm was regular,  bradycardic . No murmurs, rubs, or gallops.  ABDOMEN: Soft, mild discomfort in suprapubic area, left and right lower quadrants, but no rebound or guarding, nondistended. Bowel sounds present. No organomegaly or mass.  EXTREMITIES: No pedal edema, cyanosis, or clubbing.  NEUROLOGIC: Cranial nerves II through XII are intact. Muscle strength 5/5 in all extremities. Sensation intact. Gait not checked.  PSYCHIATRIC: The patient is alert and oriented x 3. Memory is somewhat impaired SKIN: No obvious rash, lesion, or ulcer.   LABORATORY PANEL:   CBC  Recent Labs Lab 01/04/17 0945  WBC 11.4*  HGB 10.0*  HCT 30.7*  PLT 292  MCV 91.0  MCH 29.7  MCHC 32.6  RDW 16.6*  LYMPHSABS 2.1  MONOABS 0.7  EOSABS 0.1  BASOSABS 0.1   ------------------------------------------------------------------------------------------------------------------  Chemistries   Recent Labs Lab 01/04/17 0945  NA 137  K 3.4*  CL 102  CO2 22  GLUCOSE 238*  BUN 21*  CREATININE 2.00*  CALCIUM 8.7*  AST 37  ALT 13*  ALKPHOS 47  BILITOT 0.6   ------------------------------------------------------------------------------------------------------------------  Cardiac Enzymes  Recent Labs Lab  01/04/17 0945  TROPONINI 0.04*   ------------------------------------------------------------------------------------------------------------------  RADIOLOGY: Ct Head Wo Contrast  Result Date: 01/04/2017 CLINICAL DATA:  Headache and neck pain following a fall today. EXAM: CT HEAD WITHOUT CONTRAST CT CERVICAL SPINE WITHOUT CONTRAST TECHNIQUE: Multidetector CT imaging of the head and cervical spine was performed following the standard protocol without intravenous contrast. Multiplanar CT image reconstructions of the cervical spine were also generated. COMPARISON:  Head CT dated 01/03/2017, brain MR dated 09/10/2016 and cervical spine CT dated 04/05/2012. FINDINGS: CT HEAD FINDINGS Brain: Diffusely enlarged ventricles and  subarachnoid spaces. Patchy white matter low density in both cerebral hemispheres. No intracranial hemorrhage, mass lesion or CT evidence of acute infarction. Vascular: No hyperdense vessel or unexpected calcification. Skull: Normal. Negative for fracture or focal lesion. Sinuses/Orbits: No acute finding. Other: None. CT CERVICAL SPINE FINDINGS Alignment: Stable minimal anterolisthesis at the C2-3 and C6-7 levels. Skull base and vertebrae: No acute fracture. No primary bone lesion or focal pathologic process. Soft tissues and spinal canal: No prevertebral fluid or swelling. No visible canal hematoma. Disc levels: Stable moderate-to-marked disc space narrowing and mild anterior and posterior spur formation at the C3-4 and C4-5 levels. Moderate disc space narrowing at the C5-6 level with mild posterior spur formation without significant change. Mild anterior and posterior spur formation at the C6-7 level. Mild facet degenerative changes at multiple levels. Upper chest: Clear lung apices. Other: None. IMPRESSION: 1. No skull fracture or intracranial hemorrhage. 2. No cervical spine fracture or traumatic subluxation. 3. Stable diffuse cerebral and cerebellar atrophy and chronic small vessel white matter ischemic changes. 4. Stable cervical spine degenerative changes. Electronically Signed   By: Claudie Revering M.D.   On: 01/04/2017 10:15   Ct Head Wo Contrast  Result Date: 01/03/2017 CLINICAL DATA:  Status post fall. Hit head on curb. Initial encounter. EXAM: CT HEAD WITHOUT CONTRAST TECHNIQUE: Contiguous axial images were obtained from the base of the skull through the vertex without intravenous contrast. COMPARISON:  CT of the head performed 10/06/2016 FINDINGS: Brain: No evidence of acute infarction, hemorrhage, hydrocephalus, extra-axial collection or mass lesion/mass effect. Prominence of the ventricles and sulci reflects mild to moderate cortical volume loss. Cerebellar atrophy is noted. Scattered periventricular  white matter change likely reflects small vessel ischemic microangiopathy. The brainstem and fourth ventricle are within normal limits. The basal ganglia are unremarkable in appearance. The cerebral hemispheres demonstrate grossly normal gray-white differentiation. No mass effect or midline shift is seen. Vascular: No hyperdense vessel or unexpected calcification. Skull: There is no evidence of fracture; visualized osseous structures are unremarkable in appearance. Sinuses/Orbits: The visualized portions of the orbits are within normal limits. The paranasal sinuses and mastoid air cells are well-aerated. Other: No significant soft tissue abnormalities are seen. IMPRESSION: 1. No evidence of traumatic intracranial injury or fracture. 2. Mild to moderate cortical volume loss and scattered small vessel ischemic microangiopathy. Next Electronically Signed   By: Garald Balding M.D.   On: 01/03/2017 18:37   Ct Cervical Spine Wo Contrast  Result Date: 01/04/2017 CLINICAL DATA:  Headache and neck pain following a fall today. EXAM: CT HEAD WITHOUT CONTRAST CT CERVICAL SPINE WITHOUT CONTRAST TECHNIQUE: Multidetector CT imaging of the head and cervical spine was performed following the standard protocol without intravenous contrast. Multiplanar CT image reconstructions of the cervical spine were also generated. COMPARISON:  Head CT dated 01/03/2017, brain MR dated 09/10/2016 and cervical spine CT dated 04/05/2012. FINDINGS: CT HEAD FINDINGS Brain: Diffusely enlarged ventricles and subarachnoid spaces. Patchy white matter  low density in both cerebral hemispheres. No intracranial hemorrhage, mass lesion or CT evidence of acute infarction. Vascular: No hyperdense vessel or unexpected calcification. Skull: Normal. Negative for fracture or focal lesion. Sinuses/Orbits: No acute finding. Other: None. CT CERVICAL SPINE FINDINGS Alignment: Stable minimal anterolisthesis at the C2-3 and C6-7 levels. Skull base and vertebrae: No  acute fracture. No primary bone lesion or focal pathologic process. Soft tissues and spinal canal: No prevertebral fluid or swelling. No visible canal hematoma. Disc levels: Stable moderate-to-marked disc space narrowing and mild anterior and posterior spur formation at the C3-4 and C4-5 levels. Moderate disc space narrowing at the C5-6 level with mild posterior spur formation without significant change. Mild anterior and posterior spur formation at the C6-7 level. Mild facet degenerative changes at multiple levels. Upper chest: Clear lung apices. Other: None. IMPRESSION: 1. No skull fracture or intracranial hemorrhage. 2. No cervical spine fracture or traumatic subluxation. 3. Stable diffuse cerebral and cerebellar atrophy and chronic small vessel white matter ischemic changes. 4. Stable cervical spine degenerative changes. Electronically Signed   By: Claudie Revering M.D.   On: 01/04/2017 10:15   Ct Pelvis Wo Contrast  Result Date: 01/04/2017 CLINICAL DATA:  Right hip pain following a fall. Nondisplaced subcapital femoral neck fracture on the left on recent hip radiographs. No fracture seen on the right on the radiographs. EXAM: CT PELVIS WITHOUT CONTRAST TECHNIQUE: Multidetector CT imaging of the pelvis was performed following the standard protocol without intravenous contrast. COMPARISON:  Pelvis and bilateral hip radiographs obtained earlier today. FINDINGS: Urinary Tract: Unremarkable urinary bladder and distal ureters. Multiple bilateral pelvic phleboliths. Bowel: Stool throughout the visualized colon, most pronounced in the rectum. Sigmoid colon diverticula. Unremarkable small bowel loops. Normal appearing appendix. Vascular/Lymphatic: Atheromatous arterial calcifications. No enlarged lymph nodes. Reproductive:  Small, normal appearing uterus and ovaries. Other:  Very small umbilical hernia containing fat. Musculoskeletal: Lower lumbar spine degenerative changes. Sacral meningoceles. Minimal bilateral femoral  head and neck junction spur formation. The spur formation on the left mimicked a subcapital femoral neck fracture on the recent radiographs. No fracture or dislocation is seen on either side. IMPRESSION: 1. No fracture or dislocation involving either hip. 2. Minimal bilateral femoral head and neck junction degenerative spur formation. The spur formation on the left mimicked a subcapital femoral neck fracture on the recent radiographs. 3. Sigmoid diverticulosis. Electronically Signed   By: Claudie Revering M.D.   On: 01/04/2017 11:09   Dg Hip Unilat W Or Wo Pelvis 2-3 Views Left  Result Date: 01/04/2017 CLINICAL DATA:  Syncopal episode this morning while walking to the bathroom with family when patient went limp. Patient here yesterday for same. EXAM: DG HIP (WITH OR WITHOUT PELVIS) 2-3V LEFT COMPARISON:  None. FINDINGS: Nondisplaced subcapital left femoral neck fracture. Generalized osteopenia. Joint spaces are maintained. Lower lumbar spine spondylosis partially visualize. No aggressive lytic or sclerotic osseous lesion. Peripheral vascular atherosclerotic disease. IMPRESSION: Nondisplaced subcapital left femoral neck fracture. Electronically Signed   By: Kathreen Devoid   On: 01/04/2017 10:11   Dg Hip Unilat W Or Wo Pelvis 2-3 Views Right  Result Date: 01/04/2017 CLINICAL DATA:  Syncopal episode walking to the bathroom. Bilateral hip pain. Limping. EXAM: DG HIP (WITH OR WITHOUT PELVIS) 2-3V RIGHT COMPARISON:  None. FINDINGS: No acute fracture or dislocation. Generalized osteopenia. Joint spaces are maintained. Lower lumbar spine spondylosis partially visualize. No aggressive lytic or sclerotic osseous lesion. Peripheral vascular atherosclerotic disease. IMPRESSION: 1.  No acute osseous injury of the right hip.  2. Given the patient's age and osteopenia, IF there is persistent clinical concern for an occult hip fracture, THEN a CT or MRI of the hip is recommended for increased sensitivity. Electronically Signed   By:  Kathreen Devoid   On: 01/04/2017 10:09   Dg Hip Unilat With Pelvis 2-3 Views Right  Result Date: 01/03/2017 CLINICAL DATA:  Acute onset of right hip pain after tripping over curb and falling. Initial encounter. EXAM: DG HIP (WITH OR WITHOUT PELVIS) 2-3V RIGHT COMPARISON:  None. FINDINGS: There is no evidence of fracture or dislocation. Both femoral heads are seated normally within their respective acetabula. The proximal right femur appears intact. No significant degenerative change is appreciated. The sacroiliac joints are unremarkable in appearance. The visualized bowel gas pattern is grossly unremarkable in appearance. Scattered vascular calcifications are seen. IMPRESSION: 1. No evidence of fracture or dislocation. 2. Scattered vascular calcifications seen. Electronically Signed   By: Garald Balding M.D.   On: 01/03/2017 18:48    EKG: Orders placed or performed during the hospital encounter of 01/04/17  . ED EKG  . ED EKG  EKG in the emergency room reveals sinus rhythm at 58 bpm, abnormal R-wave progression, nonspecific ST-T changes  IMPRESSION AND PLAN:  Active Problems:   Acute on chronic renal failure (HCC)   Syncope   Lower abdominal pain   CKD (chronic kidney disease), stage III   Elevated troponin   Hypokalemia   Leukocytosis   Dysuria   Constipation  #1, syncope , admit patient to medical floor, get orthostatic vital signs, echocardiogram, carotid ultrasound, continue IV fluids #2. Acute on chronic renal failure, CAD stage III, initiate IV fluids, follow creatinine in the morning, hold Lasix #3. Lower abdominal pain of unclear etiology at this time, CT scan is pending, urinalysis is pending, possibly constipation related, initiate Colace, Senokot, follow clinically, radiologically #4. Elevated troponin, likely demand ischemia, follow troponin 3, get echocardiogram, continue patient on aspirin, hold Toprol due to bradycardia, add nitroglycerin topically #5. Hypokalemia,  supplementing orally, check magnesium level #6. Leukocytosis, follow closely, rule out urinary tract infection #7, dysuria, urinalysis is pending, get urine cultures as well #8, constipation, initiate Colace, Senokot, follow closely, may need an enema  All the records are reviewed and case discussed with ED provider. Management plans discussed with the patient, family and they are in agreement.  CODE STATUS: Code Status History    Date Active Date Inactive Code Status Order ID Comments User Context   10/22/2016  2:28 AM 10/23/2016  6:36 PM Full Code [id number]  Lance Coon, MD ED   10/16/2016  3:47 PM 10/18/2016  3:25 PM Full Code [id number]  Theodoro Grist, MD ED   09/10/2016  5:07 AM 09/15/2016  6:26 PM Full Code [id number]  Harrie Foreman, MD Inpatient       TOTAL TIME TAKING CARE OF THIS PATIENT: 50 minutes.    Theodoro Grist M.D on 01/04/2017 at 2:02 PM  Between 7am to 6pm - Pager - [phone] After 6pm go to [email or web address] - password [email or web address] [facility]  Office  [phone]  CC: Primary care physician; Kirk Ruths., MD

## 2017-01-04 NOTE — ED Notes (Signed)
Pt returned from scan. Pt placed back on monitor and given two warm blankets. Pt stated she was comfortable. Registration in room and call bell placed on bed rail.

## 2017-01-04 NOTE — ED Notes (Signed)
Pt placed on bedpan

## 2017-01-04 NOTE — ED Notes (Signed)
Pt visitor requesting sprite for pt. Visitor informed that npo until results of scan and lab comes back.

## 2017-01-04 NOTE — Progress Notes (Signed)
Attempted to give patient soap suds enema, patient requested it be done in the morning. Will pass on to oncoming RN.

## 2017-01-05 ENCOUNTER — Inpatient Hospital Stay: Admit: 2017-01-05 | Payer: Medicare Other

## 2017-01-05 ENCOUNTER — Inpatient Hospital Stay: Payer: Medicare Other

## 2017-01-05 DIAGNOSIS — F17290 Nicotine dependence, other tobacco product, uncomplicated: Secondary | ICD-10-CM | POA: Diagnosis present

## 2017-01-05 DIAGNOSIS — I130 Hypertensive heart and chronic kidney disease with heart failure and stage 1 through stage 4 chronic kidney disease, or unspecified chronic kidney disease: Secondary | ICD-10-CM | POA: Diagnosis present

## 2017-01-05 DIAGNOSIS — I4892 Unspecified atrial flutter: Secondary | ICD-10-CM | POA: Diagnosis present

## 2017-01-05 DIAGNOSIS — Z7901 Long term (current) use of anticoagulants: Secondary | ICD-10-CM | POA: Diagnosis not present

## 2017-01-05 DIAGNOSIS — E785 Hyperlipidemia, unspecified: Secondary | ICD-10-CM | POA: Diagnosis present

## 2017-01-05 DIAGNOSIS — R55 Syncope and collapse: Secondary | ICD-10-CM | POA: Diagnosis present

## 2017-01-05 DIAGNOSIS — D72829 Elevated white blood cell count, unspecified: Secondary | ICD-10-CM | POA: Diagnosis present

## 2017-01-05 DIAGNOSIS — W19XXXA Unspecified fall, initial encounter: Secondary | ICD-10-CM

## 2017-01-05 DIAGNOSIS — N183 Chronic kidney disease, stage 3 (moderate): Secondary | ICD-10-CM | POA: Diagnosis present

## 2017-01-05 DIAGNOSIS — S72011A Unspecified intracapsular fracture of right femur, initial encounter for closed fracture: Secondary | ICD-10-CM | POA: Diagnosis present

## 2017-01-05 DIAGNOSIS — E869 Volume depletion, unspecified: Secondary | ICD-10-CM | POA: Diagnosis not present

## 2017-01-05 DIAGNOSIS — I4891 Unspecified atrial fibrillation: Secondary | ICD-10-CM | POA: Diagnosis not present

## 2017-01-05 DIAGNOSIS — E890 Postprocedural hypothyroidism: Secondary | ICD-10-CM | POA: Diagnosis present

## 2017-01-05 DIAGNOSIS — S300XXA Contusion of lower back and pelvis, initial encounter: Secondary | ICD-10-CM | POA: Diagnosis not present

## 2017-01-05 DIAGNOSIS — S7011XA Contusion of right thigh, initial encounter: Secondary | ICD-10-CM

## 2017-01-05 DIAGNOSIS — R791 Abnormal coagulation profile: Secondary | ICD-10-CM | POA: Diagnosis present

## 2017-01-05 DIAGNOSIS — D62 Acute posthemorrhagic anemia: Secondary | ICD-10-CM | POA: Diagnosis present

## 2017-01-05 DIAGNOSIS — K5900 Constipation, unspecified: Secondary | ICD-10-CM | POA: Diagnosis present

## 2017-01-05 DIAGNOSIS — N179 Acute kidney failure, unspecified: Secondary | ICD-10-CM | POA: Diagnosis present

## 2017-01-05 DIAGNOSIS — E1122 Type 2 diabetes mellitus with diabetic chronic kidney disease: Secondary | ICD-10-CM | POA: Diagnosis present

## 2017-01-05 DIAGNOSIS — R001 Bradycardia, unspecified: Secondary | ICD-10-CM | POA: Diagnosis present

## 2017-01-05 DIAGNOSIS — S7010XA Contusion of unspecified thigh, initial encounter: Secondary | ICD-10-CM | POA: Diagnosis present

## 2017-01-05 DIAGNOSIS — E876 Hypokalemia: Secondary | ICD-10-CM | POA: Diagnosis present

## 2017-01-05 DIAGNOSIS — D510 Vitamin B12 deficiency anemia due to intrinsic factor deficiency: Secondary | ICD-10-CM | POA: Diagnosis present

## 2017-01-05 DIAGNOSIS — M8580 Other specified disorders of bone density and structure, unspecified site: Secondary | ICD-10-CM | POA: Diagnosis present

## 2017-01-05 DIAGNOSIS — I248 Other forms of acute ischemic heart disease: Secondary | ICD-10-CM | POA: Diagnosis present

## 2017-01-05 DIAGNOSIS — M545 Low back pain: Secondary | ICD-10-CM | POA: Diagnosis present

## 2017-01-05 DIAGNOSIS — F329 Major depressive disorder, single episode, unspecified: Secondary | ICD-10-CM | POA: Diagnosis present

## 2017-01-05 LAB — BASIC METABOLIC PANEL
ANION GAP: 10 (ref 5–15)
BUN: 27 mg/dL — ABNORMAL HIGH (ref 6–20)
CO2: 23 mmol/L (ref 22–32)
Calcium: 8.1 mg/dL — ABNORMAL LOW (ref 8.9–10.3)
Chloride: 103 mmol/L (ref 101–111)
Creatinine, Ser: 2.56 mg/dL — ABNORMAL HIGH (ref 0.44–1.00)
GFR calc non Af Amer: 16 mL/min — ABNORMAL LOW (ref >60)
GFR, EST AFRICAN AMERICAN: 18 mL/min — AB (ref >60)
Glucose, Bld: 143 mg/dL — ABNORMAL HIGH (ref 65–99)
Potassium: 4.1 mmol/L (ref 3.5–5.1)
SODIUM: 136 mmol/L (ref 135–145)

## 2017-01-05 LAB — PREPARE RBC (CROSSMATCH)

## 2017-01-05 LAB — PROTIME-INR
INR: 4.39
Prothrombin Time: 43.1 seconds — ABNORMAL HIGH (ref 11.4–15.2)

## 2017-01-05 LAB — GLUCOSE, CAPILLARY
Glucose-Capillary: 135 mg/dL — ABNORMAL HIGH (ref 65–99)
Glucose-Capillary: 136 mg/dL — ABNORMAL HIGH (ref 65–99)
Glucose-Capillary: 134 mg/dL — ABNORMAL HIGH (ref 65–99)
Glucose-Capillary: 119 mg/dL — ABNORMAL HIGH (ref 65–99)

## 2017-01-05 LAB — CBC
HCT: 20.2 % — ABNORMAL LOW (ref 35.0–47.0)
Hemoglobin: 6.6 g/dL — ABNORMAL LOW (ref 12.0–16.0)
MCH: 30.1 pg (ref 26.0–34.0)
MCHC: 32.7 g/dL (ref 32.0–36.0)
MCV: 92 fL (ref 80.0–100.0)
Platelets: 219 10*3/uL (ref 150–440)
RBC: 2.2 MIL/uL — AB (ref 3.80–5.20)
RDW: 16.5 % — ABNORMAL HIGH (ref 11.5–14.5)
WBC: 11.4 10*3/uL — AB (ref 3.6–11.0)

## 2017-01-05 LAB — TROPONIN I: Troponin I: 0.03 ng/mL (ref <0.03)

## 2017-01-05 LAB — T4, FREE: FREE T4: 1.36 ng/dL — AB (ref 0.61–1.12)

## 2017-01-05 LAB — HEMOGLOBIN A1C
Hgb A1c MFr Bld: 6.6 % — ABNORMAL HIGH (ref 4.8–5.6)
Mean Plasma Glucose: 143 mg/dL

## 2017-01-05 LAB — HEMOGLOBIN: HEMOGLOBIN: 8.4 g/dL — AB (ref 12.0–16.0)

## 2017-01-05 LAB — ABO/RH: ABO/RH(D): O POS

## 2017-01-05 MED ORDER — HALOPERIDOL LACTATE 5 MG/ML IJ SOLN
0.5000 mg | Freq: Once | INTRAMUSCULAR | Status: AC
  Administered 2017-01-06: 0.5 mg via INTRAVENOUS
  Filled 2017-01-05: qty 1

## 2017-01-05 MED ORDER — SODIUM CHLORIDE 0.9% FLUSH: 10.0000 mL | INTRAVENOUS | Status: DC | PRN

## 2017-01-05 MED ORDER — INSULIN ASPART 100 UNIT/ML ~~LOC~~ SOLN
0.0000 [IU] | Freq: Three times a day (TID) | SUBCUTANEOUS | Status: DC
  Administered 2017-01-05 – 2017-01-07 (×5): 1 [IU] via SUBCUTANEOUS
  Filled 2017-01-05 (×5): qty 1

## 2017-01-05 MED ORDER — AMIODARONE HCL 200 MG PO TABS
200.0000 mg | ORAL_TABLET | Freq: Every day | ORAL | Status: DC
  Administered 2017-01-06 – 2017-01-07 (×2): 200 mg via ORAL
  Filled 2017-01-05 (×2): qty 1

## 2017-01-05 MED ORDER — PHYTONADIONE 5 MG PO TABS
5.0000 mg | ORAL_TABLET | Freq: Once | ORAL | Status: AC
  Administered 2017-01-05: 5 mg via ORAL
  Filled 2017-01-05: qty 1

## 2017-01-05 MED ORDER — FUROSEMIDE 10 MG/ML IJ SOLN
20.0000 mg | Freq: Once | INTRAMUSCULAR | Status: AC
  Administered 2017-01-05: 20 mg via INTRAVENOUS
  Filled 2017-01-05: qty 2

## 2017-01-05 MED ORDER — SODIUM CHLORIDE 0.9 % IV SOLN
Freq: Once | INTRAVENOUS | Status: AC
  Administered 2017-01-05: 16:00:00 via INTRAVENOUS

## 2017-01-05 MED ORDER — SODIUM CHLORIDE 0.9% FLUSH
10.0000 mL | Freq: Two times a day (BID) | INTRAVENOUS | Status: DC
  Administered 2017-01-05 – 2017-01-07 (×3): 10 mL

## 2017-01-05 NOTE — Progress Notes (Addendum)
[facility] [place] NAME: Gearldean Lomanto    MR#:  [id number]  DATE OF BIRTH:  Aug 03, 1928  SUBJECTIVE:    Patient denies chest pain or shortness of breath. No dizziness  REVIEW OF SYSTEMS:    Review of Systems  Constitutional: Negative.  Negative for chills, fever and malaise/fatigue.  HENT: Negative.  Negative for ear discharge, ear pain, hearing loss, nosebleeds and sore throat.   Eyes: Negative.  Negative for blurred vision and pain.  Respiratory: Negative.  Negative for cough, hemoptysis, shortness of breath and wheezing.   Cardiovascular: Negative.  Negative for chest pain, palpitations and leg swelling.  Gastrointestinal: Negative.  Negative for abdominal pain, blood in stool, diarrhea, nausea and vomiting.  Genitourinary: Negative.  Negative for dysuria.  Musculoskeletal: Negative.  Negative for back pain.       Large hematoma   Skin: Negative.   Neurological: Negative for dizziness, tremors, speech change, focal weakness, seizures and headaches.  Endo/Heme/Allergies: Negative.  Does not bruise/bleed easily.  Psychiatric/Behavioral: Negative.  Negative for depression, hallucinations and suicidal ideas.    Tolerating Diet: yes      DRUG ALLERGIES:  No Known Allergies  VITALS:  Blood pressure (!) 139/44, pulse 69, temperature 99 F (37.2 C), temperature source Oral, resp. rate 17, height 5\' 5"  (1.651 m), weight 70.9 kg (156 lb 6 oz), SpO2 98 %.  PHYSICAL EXAMINATION:   Physical Exam  Constitutional: She is oriented to person, place, and time and well-developed, well-nourished, and in no distress. No distress.  HENT:  Head: Normocephalic.  Eyes: No scleral icterus.  Neck: Normal range of motion. Neck supple. No JVD present. No tracheal deviation present.  Cardiovascular: Normal rate, regular rhythm and normal heart sounds.  Exam reveals no gallop and no friction rub.   No murmur heard. Pulmonary/Chest: Effort normal and  breath sounds normal. No respiratory distress. She has no wheezes. She has no rales. She exhibits no tenderness.  Abdominal: Soft. Bowel sounds are normal. She exhibits no distension and no mass. There is no tenderness. There is no rebound and no guarding.  Musculoskeletal: She exhibits no edema.  Decreased range of motion right hip due to hematoma  Neurological: She is alert and oriented to person, place, and time.  Skin: Skin is warm. No rash noted. No erythema.  Large right gluteal hematoma  Psychiatric: Affect and judgment normal.      LABORATORY PANEL:   CBC  Recent Labs Lab 01/05/17 0417  WBC 11.4*  HGB 6.6*  HCT 20.2*  PLT 219   ------------------------------------------------------------------------------------------------------------------  Chemistries   Recent Labs Lab 01/04/17 0945 01/04/17 1633 01/05/17 0417  NA 137  --  136  K 3.4*  --  4.1  CL 102  --  103  CO2 22  --  23  GLUCOSE 238*  --  143*  BUN 21*  --  27*  CREATININE 2.00*  --  2.56*  CALCIUM 8.7*  --  8.1*  MG  --  1.9  --   AST 37  --   --   ALT 13*  --   --   ALKPHOS 47  --   --   BILITOT 0.6  --   --    ------------------------------------------------------------------------------------------------------------------  Cardiac Enzymes  Recent Labs Lab 01/04/17 1633 01/04/17 2153 01/05/17 0417  TROPONINI 0.04* 0.04* 0.03*   ------------------------------------------------------------------------------------------------------------------  RADIOLOGY:  Ct Abdomen Pelvis Wo Contrast  Result Date: 01/04/2017 CLINICAL DATA:  Fall this morning  and yesterday. Right-sided abdominal pain. Pelvic pain. EXAM: CT ABDOMEN AND PELVIS WITHOUT CONTRAST TECHNIQUE: Multidetector CT imaging of the abdomen and pelvis was performed following the standard protocol without IV contrast. COMPARISON:  10/16/2016 CT abdomen/ pelvis. Pelvic CT from earlier today. FINDINGS: Lower chest: No significant pulmonary  nodules or acute consolidative airspace disease. Oral contrast is seen in the lower thoracic esophagus, suggesting dysmotility and/ or gastroesophageal reflux. Hepatobiliary: Normal liver with no liver mass. Cholecystectomy. Bile ducts are stable and within expected post cholecystectomy limits. Common bile duct diameter 7 mm. No radiopaque choledocholithiasis . Pancreas: Diffuse fatty replacement of the pancreas without pancreatic mass or duct dilation, unchanged. Spleen: Normal size. No mass. Adrenals/Urinary Tract: Normal adrenals. Symmetric mild to moderate renal atrophy, unchanged. No hydronephrosis. No renal stones. Two exophytic subcentimeter hypodense renal cortical lesions in the interpolar left kidney, too small to characterize, not appreciably changed, suggesting benign renal cysts, for which no further follow-up is required. No additional contour deforming renal lesions. Normal bladder. Stomach/Bowel: Small hiatal hernia. Otherwise grossly normal stomach. Normal caliber small bowel with no small bowel wall thickening. Normal appendix. Moderate sigmoid diverticulosis, with no definite large bowel wall thickening or pericolonic fat stranding. Moderate stool in the left colon and rectum. Oral contrast progresses to the distal colon. Vascular/Lymphatic: Atherosclerotic nonaneurysmal abdominal aorta. No pathologically enlarged lymph nodes in the abdomen or pelvis. Reproductive: Grossly normal uterus.  No adnexal mass. Other: No pneumoperitoneum, ascites or focal fluid collection. Musculoskeletal: No aggressive appearing focal osseous lesions. Marked degenerative disc disease at T12-L1. No fractures. There is a large partially visualized contusion with hematoma in the subcutaneous lateral right gluteal region, with the subcutaneous hematoma measuring at least 7.3 x 11.7 cm (series 7/image 19). IMPRESSION: 1. Large partially visualized contusion with hematoma in the subcutaneous lateral right gluteal region. No  fractures. 2. Otherwise no acute abnormality. No evidence of bowel obstruction or acute bowel inflammation. Moderate sigmoid diverticulosis, with no evidence of acute diverticulitis. Moderate distal colonic and rectal stool. 3. Aortic atherosclerosis. 4. Small hiatal hernia. Electronically Signed   By: Ilona Sorrel M.D.   On: 01/04/2017 14:43   Ct Head Wo Contrast  Result Date: 01/04/2017 CLINICAL DATA:  Headache and neck pain following a fall today. EXAM: CT HEAD WITHOUT CONTRAST CT CERVICAL SPINE WITHOUT CONTRAST TECHNIQUE: Multidetector CT imaging of the head and cervical spine was performed following the standard protocol without intravenous contrast. Multiplanar CT image reconstructions of the cervical spine were also generated. COMPARISON:  Head CT dated 01/03/2017, brain MR dated 09/10/2016 and cervical spine CT dated 04/05/2012. FINDINGS: CT HEAD FINDINGS Brain: Diffusely enlarged ventricles and subarachnoid spaces. Patchy white matter low density in both cerebral hemispheres. No intracranial hemorrhage, mass lesion or CT evidence of acute infarction. Vascular: No hyperdense vessel or unexpected calcification. Skull: Normal. Negative for fracture or focal lesion. Sinuses/Orbits: No acute finding. Other: None. CT CERVICAL SPINE FINDINGS Alignment: Stable minimal anterolisthesis at the C2-3 and C6-7 levels. Skull base and vertebrae: No acute fracture. No primary bone lesion or focal pathologic process. Soft tissues and spinal canal: No prevertebral fluid or swelling. No visible canal hematoma. Disc levels: Stable moderate-to-marked disc space narrowing and mild anterior and posterior spur formation at the C3-4 and C4-5 levels. Moderate disc space narrowing at the C5-6 level with mild posterior spur formation without significant change. Mild anterior and posterior spur formation at the C6-7 level. Mild facet degenerative changes at multiple levels. Upper chest: Clear lung apices. Other: None. IMPRESSION: 1.  No skull fracture or intracranial hemorrhage. 2. No cervical spine fracture or traumatic subluxation. 3. Stable diffuse cerebral and cerebellar atrophy and chronic small vessel white matter ischemic changes. 4. Stable cervical spine degenerative changes. Electronically Signed   By: Claudie Revering M.D.   On: 01/04/2017 10:15   Ct Head Wo Contrast  Result Date: 01/03/2017 CLINICAL DATA:  Status post fall. Hit head on curb. Initial encounter. EXAM: CT HEAD WITHOUT CONTRAST TECHNIQUE: Contiguous axial images were obtained from the base of the skull through the vertex without intravenous contrast. COMPARISON:  CT of the head performed 10/06/2016 FINDINGS: Brain: No evidence of acute infarction, hemorrhage, hydrocephalus, extra-axial collection or mass lesion/mass effect. Prominence of the ventricles and sulci reflects mild to moderate cortical volume loss. Cerebellar atrophy is noted. Scattered periventricular white matter change likely reflects small vessel ischemic microangiopathy. The brainstem and fourth ventricle are within normal limits. The basal ganglia are unremarkable in appearance. The cerebral hemispheres demonstrate grossly normal gray-white differentiation. No mass effect or midline shift is seen. Vascular: No hyperdense vessel or unexpected calcification. Skull: There is no evidence of fracture; visualized osseous structures are unremarkable in appearance. Sinuses/Orbits: The visualized portions of the orbits are within normal limits. The paranasal sinuses and mastoid air cells are well-aerated. Other: No significant soft tissue abnormalities are seen. IMPRESSION: 1. No evidence of traumatic intracranial injury or fracture. 2. Mild to moderate cortical volume loss and scattered small vessel ischemic microangiopathy. Next Electronically Signed   By: Garald Balding M.D.   On: 01/03/2017 18:37   Ct Cervical Spine Wo Contrast  Result Date: 01/04/2017 CLINICAL DATA:  Headache and neck pain following a fall  today. EXAM: CT HEAD WITHOUT CONTRAST CT CERVICAL SPINE WITHOUT CONTRAST TECHNIQUE: Multidetector CT imaging of the head and cervical spine was performed following the standard protocol without intravenous contrast. Multiplanar CT image reconstructions of the cervical spine were also generated. COMPARISON:  Head CT dated 01/03/2017, brain MR dated 09/10/2016 and cervical spine CT dated 04/05/2012. FINDINGS: CT HEAD FINDINGS Brain: Diffusely enlarged ventricles and subarachnoid spaces. Patchy white matter low density in both cerebral hemispheres. No intracranial hemorrhage, mass lesion or CT evidence of acute infarction. Vascular: No hyperdense vessel or unexpected calcification. Skull: Normal. Negative for fracture or focal lesion. Sinuses/Orbits: No acute finding. Other: None. CT CERVICAL SPINE FINDINGS Alignment: Stable minimal anterolisthesis at the C2-3 and C6-7 levels. Skull base and vertebrae: No acute fracture. No primary bone lesion or focal pathologic process. Soft tissues and spinal canal: No prevertebral fluid or swelling. No visible canal hematoma. Disc levels: Stable moderate-to-marked disc space narrowing and mild anterior and posterior spur formation at the C3-4 and C4-5 levels. Moderate disc space narrowing at the C5-6 level with mild posterior spur formation without significant change. Mild anterior and posterior spur formation at the C6-7 level. Mild facet degenerative changes at multiple levels. Upper chest: Clear lung apices. Other: None. IMPRESSION: 1. No skull fracture or intracranial hemorrhage. 2. No cervical spine fracture or traumatic subluxation. 3. Stable diffuse cerebral and cerebellar atrophy and chronic small vessel white matter ischemic changes. 4. Stable cervical spine degenerative changes. Electronically Signed   By: Claudie Revering M.D.   On: 01/04/2017 10:15   Ct Pelvis Wo Contrast  Result Date: 01/04/2017 CLINICAL DATA:  Right hip pain following a fall. Nondisplaced subcapital  femoral neck fracture on the left on recent hip radiographs. No fracture seen on the right on the radiographs. EXAM: CT PELVIS WITHOUT CONTRAST TECHNIQUE: Multidetector CT imaging of  the pelvis was performed following the standard protocol without intravenous contrast. COMPARISON:  Pelvis and bilateral hip radiographs obtained earlier today. FINDINGS: Urinary Tract: Unremarkable urinary bladder and distal ureters. Multiple bilateral pelvic phleboliths. Bowel: Stool throughout the visualized colon, most pronounced in the rectum. Sigmoid colon diverticula. Unremarkable small bowel loops. Normal appearing appendix. Vascular/Lymphatic: Atheromatous arterial calcifications. No enlarged lymph nodes. Reproductive:  Small, normal appearing uterus and ovaries. Other:  Very small umbilical hernia containing fat. Musculoskeletal: Lower lumbar spine degenerative changes. Sacral meningoceles. Minimal bilateral femoral head and neck junction spur formation. The spur formation on the left mimicked a subcapital femoral neck fracture on the recent radiographs. No fracture or dislocation is seen on either side. IMPRESSION: 1. No fracture or dislocation involving either hip. 2. Minimal bilateral femoral head and neck junction degenerative spur formation. The spur formation on the left mimicked a subcapital femoral neck fracture on the recent radiographs. 3. Sigmoid diverticulosis. Electronically Signed   By: Claudie Revering M.D.   On: 01/04/2017 11:09   US Carotid Bilateral  Result Date: 01/04/2017 CLINICAL DATA:  Stroke. Hypertension, visual disturbance right eye, diabetes, previous tobacco abuse. EXAM: BILATERAL CAROTID DUPLEX ULTRASOUND TECHNIQUE: Pearline Cables scale imaging, color Doppler and duplex ultrasound was performed of bilateral carotid and vertebral arteries in the neck. COMPARISON:  04/07/2005 TECHNIQUE: Quantification of carotid stenosis is based on velocity parameters that correlate the residual internal carotid diameter with  NASCET-based stenosis levels, using the diameter of the distal internal carotid lumen as the denominator for stenosis measurement. The following velocity measurements were obtained: PEAK SYSTOLIC/END DIASTOLIC RIGHT ICA:                     149/17cm/sec CCA:                     13/2GM/WNU SYSTOLIC ICA/CCA RATIO:  2.72 DIASTOLIC ICA/CCA RATIO: 5.36 ECA:                     69cm/sec LEFT ICA:                     120/15cm/sec CCA:                     64/40HK/VQQ SYSTOLIC ICA/CCA RATIO:  5.95 DIASTOLIC ICA/CCA RATIO: 6.38 ECA:                     94cm/sec FINDINGS: RIGHT CAROTID ARTERY: No significant plaque or stenosis. Normal waveforms and color Doppler signal. Distal ICA mildly tortuous. RIGHT VERTEBRAL ARTERY:  Normal flow direction and waveform. LEFT CAROTID ARTERY: Mild eccentric plaque in the distal common carotid artery and bulb without significant stenosis. Normal waveforms and color Doppler signal. LEFT VERTEBRAL ARTERY: Normal flow direction and waveform. IMPRESSION: 1. Mild left carotid bifurcation plaque resulting in less than 50% diameter stenosis. 2.  Antegrade bilateral vertebral arterial flow. Electronically Signed   By: Lucrezia Europe M.D.   On: 01/04/2017 15:02   Dg Hip Unilat W Or Wo Pelvis 2-3 Views Left  Result Date: 01/04/2017 CLINICAL DATA:  Syncopal episode this morning while walking to the bathroom with family when patient went limp. Patient here yesterday for same. EXAM: DG HIP (WITH OR WITHOUT PELVIS) 2-3V LEFT COMPARISON:  None. FINDINGS: Nondisplaced subcapital left femoral neck fracture. Generalized osteopenia. Joint spaces are maintained. Lower lumbar spine spondylosis partially visualize. No aggressive lytic or sclerotic osseous lesion. Peripheral vascular atherosclerotic disease. IMPRESSION: Nondisplaced subcapital  left femoral neck fracture. Electronically Signed   By: Kathreen Devoid   On: 01/04/2017 10:11   Dg Hip Unilat W Or Wo Pelvis 2-3 Views Right  Result Date: 01/04/2017 CLINICAL  DATA:  Syncopal episode walking to the bathroom. Bilateral hip pain. Limping. EXAM: DG HIP (WITH OR WITHOUT PELVIS) 2-3V RIGHT COMPARISON:  None. FINDINGS: No acute fracture or dislocation. Generalized osteopenia. Joint spaces are maintained. Lower lumbar spine spondylosis partially visualize. No aggressive lytic or sclerotic osseous lesion. Peripheral vascular atherosclerotic disease. IMPRESSION: 1.  No acute osseous injury of the right hip. 2. Given the patient's age and osteopenia, IF there is persistent clinical concern for an occult hip fracture, THEN a CT or MRI of the hip is recommended for increased sensitivity. Electronically Signed   By: Kathreen Devoid   On: 01/04/2017 10:09   Dg Hip Unilat With Pelvis 2-3 Views Right  Result Date: 01/03/2017 CLINICAL DATA:  Acute onset of right hip pain after tripping over curb and falling. Initial encounter. EXAM: DG HIP (WITH OR WITHOUT PELVIS) 2-3V RIGHT COMPARISON:  None. FINDINGS: There is no evidence of fracture or dislocation. Both femoral heads are seated normally within their respective acetabula. The proximal right femur appears intact. No significant degenerative change is appreciated. The sacroiliac joints are unremarkable in appearance. The visualized bowel gas pattern is grossly unremarkable in appearance. Scattered vascular calcifications are seen. IMPRESSION: 1. No evidence of fracture or dislocation. 2. Scattered vascular calcifications seen. Electronically Signed   By: Garald Balding M.D.   On: 01/03/2017 18:48     ASSESSMENT AND PLAN:   83 year old very pleasant female past medical history significant for diastolic heart failure, PAF on coumadin and diabetes who presents with weakness after fall 2 days ago and found to have large right gluteal hematoma   1. Acute blood loss anemia due to extensive right gluteal hematoma Patient has consented for PRBC Transfuse 2 units today repeat CBC afterwards  2. Right gluteal hematoma: Surgery  consultation Continue to monitor CBC  3. Syncope, from bradycardia METOPROLOL stopped Continue telemetry monitoring  4. Elevated troponin due to be demand ischemia and not ACS  5. Acute kidney injury in the setting of hematoma and fall Continue with blood transfusions Nephrology consultation  6. History of PAF on Coumadin INR 4.39 Coumadin discontinued for now Vitamin K given due to problem #1 Discussed with patient and granddaughter at bedside that if she has begun falls and Coumadin is a big risk for bleeding Request cardiology consultation for further recommendations Continue amiodarone   7. Diabetes with hemoglobin A1c of 6.6 Start sliding scale insulin   8. Hypothyroidism: Continue Synthroid   9. Hyperlipidemia: Continue pravastatin   10. Depression: Continue Cymbalta   11. Chronic diastolic heart failure with preserved ejection fraction: Diuretics on hold due to acute kidney injury  She is euvolemic  Cont daily weight  PT consult for am after blood tx  Management plans discussed with the patient and grand daughter and she is in agreement.  CODE STATUS: FULL  TOTAL TIME TAKING CARE OF THIS PATIENT: 35 minutes.     POSSIBLE D/C 2 days, DEPENDING ON CLINICAL CONDITION.   Floye Fesler M.D on 01/05/2017 at 10:48 AM  Between 7am to 6pm - Pager - [phone] After 6pm go to [email or web address] - password [email or web address]  Office  [phone]  CC: Primary care physician; Kirk Ruths., MD  Note: This dictation was prepared with Dragon dictation along with smaller phrase technology.  Any transcriptional errors that result from this process are unintentional.

## 2017-01-05 NOTE — Progress Notes (Signed)
Pt had not voided yet on this shift. Bladder scan showed 415cc. Spoke with Dr. Benjie Karvonen - order to in and out cath patient. Patient adamantly refused and said she would try the bedpan again. Pt was able to void 150cc and felt like she did not need to go anymore, no burning. Will continue to monitor.

## 2017-01-05 NOTE — Consult Note (Signed)
Date of Consultation:  01/05/2017  Requesting Physician:  Bettey Costa, MD  Reason for Consultation:  Right gluteal hematoma  History of Present Illness: Tammie Mcmahon is a 83 y.o. female admitted yesterday with syncopal events.  She had fallen on 3/31 and presented to the ED at that point.  Her initial workup was negative and was discharged to home.  Then on 4/1, she had episodes of syncope and presented back to the ED.  Further imaging studies have revealed a large right gluteal hematoma.  This morning, the patient had a drop in her Hct from 30.7 to 20.2.  She was otherwise hemodynamically stable.  Surgery was consulted for her hematoma.  She has atrial fibrillation and takes Coumadin for it.  This morning, her INR was 4.39 as well.  The patient reports soreness over the right hip and gluteal area but otherwise no fevers or chills, no chest pain or shortness of breath, no nausea or vomiting.  Denies any sensory or motor deficit over the right buttocks and right leg.  Past Medical History: Past Medical History:  Diagnosis Date  . (HFpEF) heart failure with preserved ejection fraction ([place])   . Atrial fibrillation and flutter ([place])   . Breast cancer ([place])   . CKD (chronic kidney disease), stage III   . Diabetes mellitus without complication ([place])   . Hyperlipidemia   . Hypertension   . Low back pain   . Menopausal and postmenopausal disorder   . PA (pernicious anemia)   . Thyroid disease   . Valvular heart disease      Past Surgical History: Past Surgical History:  Procedure Laterality Date  . BREAST SURGERY    . GALLBLADDER SURGERY    . MASTECTOMY Left   . THYROIDECTOMY      Home Medications: Prior to Admission medications   Medication Sig Start Date End Date Taking? Authorizing Provider  amiodarone (PACERONE) 200 MG tablet Take 400 mg by mouth daily. 12/26/16  Yes Historical Provider, MD  azelastine (ASTELIN) 0.1 % nasal spray Place 1 spray into both nostrils daily as  needed for allergies.  09/30/16  Yes Historical Provider, MD  cholecalciferol (VITAMIN D) 1000 units tablet Take 1,000 Units by mouth daily.   Yes Historical Provider, MD  diclofenac sodium (VOLTAREN) 1 % GEL Apply topically 4 (four) times daily as needed.    Yes Historical Provider, MD  DULoxetine (CYMBALTA) 60 MG capsule Take 60 mg by mouth daily.    Yes Historical Provider, MD  levothyroxine (SYNTHROID, LEVOTHROID) 75 MCG tablet Take 1 tablet (75 mcg total) by mouth daily before breakfast. 10/24/16  Yes Vipul Manuella Ghazi, MD  omeprazole (PRILOSEC) 20 MG capsule Take 20 mg by mouth daily.   Yes Historical Provider, MD  potassium chloride (MICRO-K) 10 MEQ CR capsule Take 10 mEq by mouth daily.  04/10/14  Yes Historical Provider, MD  pravastatin (PRAVACHOL) 40 MG tablet Take 40 mg by mouth at bedtime.  05/19/14  Yes Historical Provider, MD  torsemide (DEMADEX) 20 MG tablet Take 40 mg by mouth daily.   Yes Historical Provider, MD  warfarin (COUMADIN) 1 MG tablet Take 1 mg by mouth as directed. Taking 3mg  daily: 1mg  tablet and 2mg  tablet; per RN at Dr. Tonette Bihari office   Yes Historical Provider, MD  warfarin (COUMADIN) 2 MG tablet Take 2 mg by mouth daily. Taking 3mg  daily: 2mg  tablet and 1mg  tablet per RN at Dr. Tonette Bihari office   Yes Historical Provider, MD  benzonatate (TESSALON) 200 MG  capsule Take 1 capsule by mouth 3 (three) times daily as needed for cough.  09/30/16   Historical Provider, MD  metoprolol succinate (TOPROL-XL) 50 MG 24 hr tablet Take 1 tablet (50 mg total) by mouth daily. Take with or immediately following a meal. Patient not taking: Reported on 01/04/2017 10/23/16   Max Sane, MD    Allergies: No Known Allergies  Social History:  reports that she has never smoked. Her smokeless tobacco use includes Snuff. She reports that she does not drink alcohol or use drugs.   Family History: Family History  Problem Relation Age of Onset  . Heart attack Father     Review of Systems: Review  of Systems  Constitutional: Negative for chills and fever.  HENT: Negative for hearing loss.   Eyes: Negative for blurred vision.  Respiratory: Negative for cough and shortness of breath.   Cardiovascular: Negative for chest pain.  Gastrointestinal: Negative for abdominal pain, blood in stool, nausea and vomiting.  Genitourinary: Negative for dysuria and hematuria.  Musculoskeletal: Positive for joint pain and myalgias.  Skin: Negative for rash.  Neurological: Negative for dizziness.  Psychiatric/Behavioral: Negative for depression.    Physical Exam BP (!) 139/44   Pulse 69   Temp 99 F (37.2 C) (Oral)   Resp 17   Ht 5\' 5"  (1.651 m)   Wt 70.9 kg (156 lb 6 oz)   SpO2 98%   BMI 26.02 kg/m  CONSTITUTIONAL: No acute distress HEENT:  Normocephalic, atraumatic, extraocular motion intact. NECK: Trachea is midline, and there is no jugular venous distension.  RESPIRATORY:  Lungs are clear, and breath sounds are equal bilaterally. Normal respiratory effort without pathologic use of accessory muscles. CARDIOVASCULAR: Heart is regular without murmurs, gallops, or rubs. GI: The abdomen is soft, non-distended, non-tender to palpation.  MUSCULOSKELETAL:  No peripheral edema or cyanosis.  Palpable right DP/PT pulses. SKIN:  Right gluteal, hip, and upper thigh with significant hematoma, causing swelling over the right hip.  There is no evidence of infection.  The area is soft without evidence of compartment syndrome.  NEUROLOGIC:  Motor and sensation is grossly normal.  Cranial nerves are grossly intact. PSYCH:  Alert and oriented to person, place and time. Affect is normal.  Laboratory Analysis: Results for orders placed or performed during the hospital encounter of 01/04/17 (from the past 24 hour(s))  Hemoglobin A1c     Status: Abnormal   Collection Time: 01/04/17  4:33 PM  Result Value Ref Range   Hgb A1c MFr Bld 6.6 (H) 4.8 - 5.6 %   Mean Plasma Glucose 143 mg/dL  Magnesium     Status:  None   Collection Time: 01/04/17  4:33 PM  Result Value Ref Range   Magnesium 1.9 1.7 - 2.4 mg/dL  Troponin I     Status: Abnormal   Collection Time: 01/04/17  4:33 PM  Result Value Ref Range   Troponin I 0.04 (HH) <0.03 ng/mL  TSH     Status: Abnormal   Collection Time: 01/04/17  4:33 PM  Result Value Ref Range   TSH 5.838 (H) 0.350 - 4.500 uIU/mL  Troponin I     Status: Abnormal   Collection Time: 01/04/17  9:53 PM  Result Value Ref Range   Troponin I 0.04 (HH) <0.03 ng/mL  Troponin I     Status: Abnormal   Collection Time: 01/05/17  4:17 AM  Result Value Ref Range   Troponin I 0.03 (HH) <0.03 ng/mL  Protime-INR  Status: Abnormal   Collection Time: 01/05/17  4:17 AM  Result Value Ref Range   Prothrombin Time 43.1 (H) 11.4 - 15.2 seconds   INR 4.39 (HH)   Basic metabolic panel     Status: Abnormal   Collection Time: 01/05/17  4:17 AM  Result Value Ref Range   Sodium 136 135 - 145 mmol/L   Potassium 4.1 3.5 - 5.1 mmol/L   Chloride 103 101 - 111 mmol/L   CO2 23 22 - 32 mmol/L   Glucose, Bld 143 (H) 65 - 99 mg/dL   BUN 27 (H) 6 - 20 mg/dL   Creatinine, Ser 2.56 (H) 0.44 - 1.00 mg/dL   Calcium 8.1 (L) 8.9 - 10.3 mg/dL   GFR calc non Af Amer 16 (L) >60 mL/min   GFR calc Af Amer 18 (L) >60 mL/min   Anion gap 10 5 - 15  CBC     Status: Abnormal   Collection Time: 01/05/17  4:17 AM  Result Value Ref Range   WBC 11.4 (H) 3.6 - 11.0 K/uL   RBC 2.20 (L) 3.80 - 5.20 MIL/uL   Hemoglobin 6.6 (L) 12.0 - 16.0 g/dL   HCT 20.2 (L) 35.0 - 47.0 %   MCV 92.0 80.0 - 100.0 fL   MCH 30.1 26.0 - 34.0 pg   MCHC 32.7 32.0 - 36.0 g/dL   RDW 16.5 (H) 11.5 - 14.5 %   Platelets 219 150 - 440 K/uL  T4, free     Status: Abnormal   Collection Time: 01/05/17  4:17 AM  Result Value Ref Range   Free T4 1.36 (H) 0.61 - 1.12 ng/dL  ABO/Rh     Status: None   Collection Time: 01/05/17  4:17 AM  Result Value Ref Range   ABO/RH(D) O POS   Type and screen [facility]      Status: None (Preliminary result)   Collection Time: 01/05/17  6:37 AM  Result Value Ref Range   ABO/RH(D) O POS    Antibody Screen NEG    Sample Expiration 01/08/2017    Unit Number [id number]    Blood Component Type RED CELLS,LR    Unit division 00    Status of Unit ALLOCATED    Transfusion Status OK TO TRANSFUSE    Crossmatch Result Compatible    Unit Number [id number]    Blood Component Type RED CELLS,LR    Unit division 00    Status of Unit ALLOCATED    Transfusion Status OK TO TRANSFUSE    Crossmatch Result Compatible   Prepare RBC     Status: None   Collection Time: 01/05/17  6:37 AM  Result Value Ref Range   Order Confirmation ORDER PROCESSED BY BLOOD BANK   Glucose, capillary     Status: Abnormal   Collection Time: 01/05/17  8:02 AM  Result Value Ref Range   Glucose-Capillary 135 (H) 65 - 99 mg/dL   Comment 1 Notify RN   Glucose, capillary     Status: Abnormal   Collection Time: 01/05/17 12:23 PM  Result Value Ref Range   Glucose-Capillary 136 (H) 65 - 99 mg/dL   Comment 1 Notify RN     Imaging: Ct Abdomen Pelvis Wo Contrast  Result Date: 01/04/2017 CLINICAL DATA:  Fall this morning and yesterday. Right-sided abdominal pain. Pelvic pain. EXAM: CT ABDOMEN AND PELVIS WITHOUT CONTRAST TECHNIQUE: Multidetector CT imaging of the abdomen and pelvis was performed following the standard protocol without IV contrast. COMPARISON:  10/16/2016 CT abdomen/ pelvis. Pelvic CT from earlier today. FINDINGS: Lower chest: No significant pulmonary nodules or acute consolidative airspace disease. Oral contrast is seen in the lower thoracic esophagus, suggesting dysmotility and/ or gastroesophageal reflux. Hepatobiliary: Normal liver with no liver mass. Cholecystectomy. Bile ducts are stable and within expected post cholecystectomy limits. Common bile duct diameter 7 mm. No radiopaque choledocholithiasis . Pancreas: Diffuse fatty replacement of the pancreas without pancreatic mass or  duct dilation, unchanged. Spleen: Normal size. No mass. Adrenals/Urinary Tract: Normal adrenals. Symmetric mild to moderate renal atrophy, unchanged. No hydronephrosis. No renal stones. Two exophytic subcentimeter hypodense renal cortical lesions in the interpolar left kidney, too small to characterize, not appreciably changed, suggesting benign renal cysts, for which no further follow-up is required. No additional contour deforming renal lesions. Normal bladder. Stomach/Bowel: Small hiatal hernia. Otherwise grossly normal stomach. Normal caliber small bowel with no small bowel wall thickening. Normal appendix. Moderate sigmoid diverticulosis, with no definite large bowel wall thickening or pericolonic fat stranding. Moderate stool in the left colon and rectum. Oral contrast progresses to the distal colon. Vascular/Lymphatic: Atherosclerotic nonaneurysmal abdominal aorta. No pathologically enlarged lymph nodes in the abdomen or pelvis. Reproductive: Grossly normal uterus.  No adnexal mass. Other: No pneumoperitoneum, ascites or focal fluid collection. Musculoskeletal: No aggressive appearing focal osseous lesions. Marked degenerative disc disease at T12-L1. No fractures. There is a large partially visualized contusion with hematoma in the subcutaneous lateral right gluteal region, with the subcutaneous hematoma measuring at least 7.3 x 11.7 cm (series 7/image 19). IMPRESSION: 1. Large partially visualized contusion with hematoma in the subcutaneous lateral right gluteal region. No fractures. 2. Otherwise no acute abnormality. No evidence of bowel obstruction or acute bowel inflammation. Moderate sigmoid diverticulosis, with no evidence of acute diverticulitis. Moderate distal colonic and rectal stool. 3. Aortic atherosclerosis. 4. Small hiatal hernia. Electronically Signed   By: Ilona Sorrel M.D.   On: 01/04/2017 14:43   US Carotid Bilateral  Result Date: 01/04/2017 CLINICAL DATA:  Stroke. Hypertension, visual  disturbance right eye, diabetes, previous tobacco abuse. EXAM: BILATERAL CAROTID DUPLEX ULTRASOUND TECHNIQUE: Pearline Cables scale imaging, color Doppler and duplex ultrasound was performed of bilateral carotid and vertebral arteries in the neck. COMPARISON:  04/07/2005 TECHNIQUE: Quantification of carotid stenosis is based on velocity parameters that correlate the residual internal carotid diameter with NASCET-based stenosis levels, using the diameter of the distal internal carotid lumen as the denominator for stenosis measurement. The following velocity measurements were obtained: PEAK SYSTOLIC/END DIASTOLIC RIGHT ICA:                     149/17cm/sec CCA:                     54/5GY/BWL SYSTOLIC ICA/CCA RATIO:  8.93 DIASTOLIC ICA/CCA RATIO: 7.34 ECA:                     69cm/sec LEFT ICA:                     120/15cm/sec CCA:                     28/76OT/LXB SYSTOLIC ICA/CCA RATIO:  2.62 DIASTOLIC ICA/CCA RATIO: 0.35 ECA:                     94cm/sec FINDINGS: RIGHT CAROTID ARTERY: No significant plaque or stenosis. Normal waveforms and color Doppler signal. Distal ICA mildly tortuous. RIGHT VERTEBRAL ARTERY:  Normal flow direction and waveform. LEFT CAROTID ARTERY: Mild eccentric plaque in the distal common carotid artery and bulb without significant stenosis. Normal waveforms and color Doppler signal. LEFT VERTEBRAL ARTERY: Normal flow direction and waveform. IMPRESSION: 1. Mild left carotid bifurcation plaque resulting in less than 50% diameter stenosis. 2.  Antegrade bilateral vertebral arterial flow. Electronically Signed   By: Lucrezia Europe M.D.   On: 01/04/2017 15:02    Assessment and Plan: This is a 83 y.o. female who presents with a large right gluteal, hip, and upper thigh hematoma.  I have independently viewed the patient's CT scan as well as laboratory studies.  Overall, she has a large hematoma without evidence of bleeding on CT, though it was done without contrast.  She did have a significant drop in her  hematocrit and her INR is supratherapeutic.  No acute surgical need at this point.  First line management would be to transfuse blood products as needed including pRBC and FFP and reverse her INR with vitamin K, as currently being done.  If there is continued blood loss or worsening hematoma, could consider vascular surgery consult for possible embolization.  Surgical exploration would be last resort as typically the source of bleeding is not found.  Recommend also discussing with her PCP and/or cardiologist regarding Coumadin therapy in the long-term given her fall.    Plan was discussed with the patient and family and all of their questions were answered.  Will continue to follow along with you.   Melvyn Neth, [place]

## 2017-01-05 NOTE — Plan of Care (Signed)
Problem: Safety: Goal: Ability to remain free from injury will improve Outcome: Progressing Bed alarm on, non skid socks on, educated pt to call before trying to get up, pt can be impulsive at times. Will continue to monitor.  Problem: Pain Managment: Goal: General experience of comfort will improve Outcome: Progressing Pt complained of abdominal pain once this shift, treated with tylenol, which gave some relief.

## 2017-01-05 NOTE — Progress Notes (Addendum)
Patient's only IV is in restricted extremity. Pt to get continuous fluids and 2 units of blood. Per Dr. Benjie Karvonen, patient needs access in unrestricted site instead. Difficult stick, ICU nurses with unsuccessful attempts. Order for PICC from Dr. Benjie Karvonen. Patient resting quietly, VSS.  Elevated Cr - ok to have IJ per Dr. Juleen China. [facility] called - ETA 12:30.

## 2017-01-05 NOTE — Consult Note (Signed)
[place]  CARDIOLOGY CONSULT NOTE  Patient ID: Dreama Kuna Kampf MRN: [id number] DOB/AGE: 1928/07/12 83 y.o.  Admit date: 01/04/2017 Referring Physician Dr. Benjie Karvonen Primary Physician   Primary Cardiologist Dr. Nehemiah Massed Reason for Consultation afib with syncope  HPI: Patient is a 72-year-old female with history of chronic atrial fibrillation with heart failure with preserved ejection fraction, hypertension who suffered several syncopal episodes one prior to admission causing a large hematoma in her right gluteal region. Patient was bradycardic at time of presentation with a junctional rhythm in the mid 30s. She was on metoprolol succinate 100 mg daily along with amiodarone at 200 mg daily. She was on warfarin and her INR was supratherapeutic at 3.39 on presentation. Her hemoglobin dropped from approximately 10 g to 6.6. Evaluation by surgery did not feel there was any further bleeding into the hematoma. She has received vitamin K. And taken off of warfarin as well as metoprolol. Echo is pending. Her rate is improved. Etiology of her syncope was likely secondary to beta blocker therapy with resultant bradycardia. Her chads score is is 6.  Review of Systems  Constitutional: Negative.   HENT: Negative.   Eyes: Negative.   Respiratory: Negative.   Cardiovascular: Negative.   Gastrointestinal: Negative.   Genitourinary: Negative.   Musculoskeletal: Positive for falls and myalgias.  Skin:       Large hematoma in the right gluteal region  Neurological: Positive for dizziness and loss of consciousness.  Endo/Heme/Allergies: Bruises/bleeds easily.  Psychiatric/Behavioral: Negative.     Past Medical History:  Diagnosis Date  . (HFpEF) heart failure with preserved ejection fraction ([place])   . Atrial fibrillation and flutter ([place])   . Breast cancer ([place])   . CKD (chronic kidney disease), stage III   . Diabetes mellitus without complication ([place])   .  Hyperlipidemia   . Hypertension   . Low back pain   . Menopausal and postmenopausal disorder   . PA (pernicious anemia)   . Thyroid disease   . Valvular heart disease     Family History  Problem Relation Age of Onset  . Heart attack Father     Social History   Social History  . Marital status: Widowed    Spouse name: N/A  . Number of children: N/A  . Years of education: N/A   Occupational History  . Not on file.   Social History Main Topics  . Smoking status: Never Smoker  . Smokeless tobacco: Current User    Types: Snuff  . Alcohol use No  . Drug use: No  . Sexual activity: Not on file   Other Topics Concern  . Not on file   Social History Narrative  . No narrative on file    Past Surgical History:  Procedure Laterality Date  . BREAST SURGERY    . GALLBLADDER SURGERY    . MASTECTOMY Left   . THYROIDECTOMY       Prescriptions Prior to Admission  Medication Sig Dispense Refill Last Dose  . amiodarone (PACERONE) 200 MG tablet Take 400 mg by mouth daily.  0 01/03/2017 at am  . azelastine (ASTELIN) 0.1 % nasal spray Place 1 spray into both nostrils daily as needed for allergies.   0 prn at prn  . cholecalciferol (VITAMIN D) 1000 units tablet Take 1,000 Units by mouth daily.   01/03/2017 at am  . diclofenac sodium (VOLTAREN) 1 % GEL Apply topically 4 (four) times daily as needed.    01/03/2017  at prn  . DULoxetine (CYMBALTA) 60 MG capsule Take 60 mg by mouth daily.    01/03/2017 at am  . levothyroxine (SYNTHROID, LEVOTHROID) 75 MCG tablet Take 1 tablet (75 mcg total) by mouth daily before breakfast. 30 tablet 0 01/03/2017 at am  . omeprazole (PRILOSEC) 20 MG capsule Take 20 mg by mouth daily.   01/03/2017 at am  . potassium chloride (MICRO-K) 10 MEQ CR capsule Take 10 mEq by mouth daily.    01/03/2017 at am  . pravastatin (PRAVACHOL) 40 MG tablet Take 40 mg by mouth at bedtime.    01/03/2017 at 2000  . torsemide (DEMADEX) 20 MG tablet Take 40 mg by mouth daily.   01/03/2017  at am  . warfarin (COUMADIN) 1 MG tablet Take 1 mg by mouth as directed. Taking 3mg  daily: 1mg  tablet and 2mg  tablet; per RN at Dr. Tonette Bihari office   01/03/2017 at 1700  . warfarin (COUMADIN) 2 MG tablet Take 2 mg by mouth daily. Taking 3mg  daily: 2mg  tablet and 1mg  tablet per RN at Dr. Tonette Bihari office   01/03/2017 at 1700  . benzonatate (TESSALON) 200 MG capsule Take 1 capsule by mouth 3 (three) times daily as needed for cough.   0 prn at prn  . metoprolol succinate (TOPROL-XL) 50 MG 24 hr tablet Take 1 tablet (50 mg total) by mouth daily. Take with or immediately following a meal. (Patient not taking: Reported on 01/04/2017) 30 tablet 0 Not Taking at Unknown time    Physical Exam: Blood pressure (!) 139/44, pulse 69, temperature 99 F (37.2 C), temperature source Oral, resp. rate 17, height 5\' 5"  (1.651 m), weight 70.9 kg (156 lb 6 oz), SpO2 98 %.   Wt Readings from Last 1 Encounters:  01/05/17 70.9 kg (156 lb 6 oz)     General appearance: alert and cooperative Resp: normal percussion bilaterally Chest wall: right sided chest wall tenderness Cardio: irregularly irregular rhythm Extremities: Large hematoma in the right gluteal region Neurologic: Grossly normal  Labs:   Lab Results  Component Value Date   WBC 11.4 (H) 01/05/2017   HGB 6.6 (L) 01/05/2017   HCT 20.2 (L) 01/05/2017   MCV 92.0 01/05/2017   PLT 219 01/05/2017    Recent Labs Lab 01/04/17 0945 01/05/17 0417  NA 137 136  K 3.4* 4.1  CL 102 103  CO2 22 23  BUN 21* 27*  CREATININE 2.00* 2.56*  CALCIUM 8.7* 8.1*  PROT 6.4*  --   BILITOT 0.6  --   ALKPHOS 47  --   ALT 13*  --   AST 37  --   GLUCOSE 238* 143*   Lab Results  Component Value Date   CKTOTAL 58 10/25/2012   CKMB 1.4 10/25/2012   TROPONINI 0.03 ([place]) 01/05/2017      Radiology: Head CT revealed no fracture or hemorrhage. There is no significant carotid disease. Abdominal CT revealed a large contusion with hematoma in the subcutaneous lateral  right gluteal region with no fractures. EKG: Atrial fibrillation with slow ventricular response initially now atrial fibrillation with controlled ventricular response  ASSESSMENT AND PLAN: Patient is an 83 year old female with history of atrial fibrillation on warfarin for anticoagulation. Patient had been on amiodarone at 200 mg daily and metoprolol at 100 mg daily for rate control. She was significantly bradycardic on presentation. She did have a large hematoma on her right hip with some discomfort so vagal tone may have increased causing the heart rate to drop however metoprolol likely  is playing a role. She was supratherapeutic on her warfarin with an INR of 3.59 on presentation and 4.39 today. There does not appear to be any further bleeding although she did have a significant drop in her hemoglobin to 6.6 from 10.0 on admission. Her fall is likely secondary to bradycardia. Given her chads score of at least 6 we'll need to reconsider chronic anticoagulation after hematoma has resolved and her INR has improved. INR goal of 2-3 is recommended. Would agree with vitamin K in this case and consideration for transfusion would remain off of warfarin until discharge and at that time consideration for further anticoagulation could be raised. Will discontinue metoprolol as you're doing and follow her A. fib rate. Heart rate has improved to the mid 60s at time of exam. Echo is pending. Signed: Teodoro Spray MD, [facility] 01/05/2017, 1:42 PM

## 2017-01-05 NOTE — Progress Notes (Signed)
OT Cancellation Note  Patient Details Name: Tammie Mcmahon MRN: [id number] DOB: 1928-08-19   Cancelled Treatment:    Reason Eval/Treat Not Completed: Medical issues which prohibited therapy. Order received, chart reviewed. Pt with hemoglobin 6.6, HCT 20.2, INR 4.39, and pending consults from nephrology and general surgery. OT contraindicated at this time. Will continue to monitor and re-attempt OT evaluation at later date/time as appropriate.    Jeni Salles, MPH, MS, OTR/L ascom [phone] 01/05/17, 8:28 AM

## 2017-01-05 NOTE — Progress Notes (Signed)
Family at bedside, insisting all 4 bed rails stay up. Patient said she is ok with that.

## 2017-01-05 NOTE — Progress Notes (Signed)
Continuous fluids to be held during blood transfusion and one time order for lasix to be given between units of blood per Dr. Benjie Karvonen.

## 2017-01-05 NOTE — Progress Notes (Signed)
PT Cancellation Note  Patient Details Name: Tammie Mcmahon MRN: [id number] DOB: 16-Nov-1927   Cancelled Treatment:    Reason Eval/Treat Not Completed: Other (comment). Consult received and chart reviewed. Pt currently with drop in Hgb from 10->6.6 as well as increased INR from 3.59->4.39. Pt with scheduled blood transfusion this date. Of note, pt with large hematoma on R buttock with pending Sx consult. Pt is not appropriate for Physical Therapy at this time. Will hold and re-attempt when medically stable.   Carrera Kiesel 01/05/2017, 8:25 AM  Greggory Stallion, PT, DPT [phone]

## 2017-01-05 NOTE — Progress Notes (Signed)
Patient ID: Tammie Mcmahon, female   DOB: Feb 22, 1928, 83 y.o.   MRN: [id number]  Called by nursing regarding significant drop in HgB from 10 to 6.6 and increase in INR from 3.59 to 4.39.  With large hematoma of lateral right glute will add orders to transfuse 2 U pRBCs, and give Vitamin K 10mg .  Request surgical consultation.

## 2017-01-05 NOTE — Clinical Social Work Note (Signed)
CSW received consult that patient may need SNF placement pending PT recommendations.  CSW to continue to assess once PT recommendations have been received.  Jones Broom. Norval Morton, MSW, [place]  01/05/2017 4:47 PM

## 2017-01-05 NOTE — Progress Notes (Signed)
Dr. Ara Kussmaul paged back, the rest of this pt's morning labs came back and pt's hemoglobin dropped from 10.0 to 6.6. No active bleeding noticeable but pt does have a large hematoma to the right hip from a fall. Dr. Ara Kussmaul to put in orders to transfuse 2U pRBC's. Will wait for orders & continue to monitor. Conley Simmonds, RN, BSN

## 2017-01-05 NOTE — Progress Notes (Signed)
CRITICAL VALUE ALERT  Date and time results received: 01/05/17 @ 0600  Test: INR Critical Value: 4.39  Name of Provider Notified: Dr. Harvie Bridge  Orders Received? Or Actions Taken?: no new orders, will continue to monitor.

## 2017-01-05 NOTE — Consult Note (Signed)
[facility] CONSULT NOTE    Date: 01/05/2017                  Patient Name:  Tammie Mcmahon  MRN: [id number]  DOB: Feb 03, 1928  Age / Sex: 83 y.o., female         PCP: Kirk Ruths., MD                 Service Requesting Consult: Hospitalist                 Reason for Consult: Acute renal failure, CKD stage III            History of Present Illness: Patient is a 83 y.o. female with a PMHx of atrial fibrillation, chronic kidney disease stage III, diabetes mellitus type 2, hyperlipidemia, hypertension, low back pain, pernicious anemia, valvular heart disease, who was admitted to [facility] on 01/04/2017 for evaluation of loss of consciousness.  Patient apparently had 2 syncopal episodes at home and one in the emergency department.  She also had a recent fall.  We are asked to see her for evaluation management of acute renal failure.  Her baseline creatinine is 1.1 on 10/23/16 with an EGFR 41.  When she presented now creatinine was up to 2.0.  Today creatinine is higher at 2.56.  It is noted that the patient was on diuretics at home in the form of torsemide.  She was on this for lower extremity edema though she appears have very little edema on exam at the moment.  Patient is a poor historian and unable to offersignificant detail.  Urinalysis was negative for proteinuria or hematuria.  Patient was noted to be bradycardic on admission as well.    Medications: Outpatient medications: Prescriptions Prior to Admission  Medication Sig Dispense Refill Last Dose  . amiodarone (PACERONE) 200 MG tablet Take 400 mg by mouth daily.  0 01/03/2017 at am  . azelastine (ASTELIN) 0.1 % nasal spray Place 1 spray into both nostrils daily as needed for allergies.   0 prn at prn  . cholecalciferol (VITAMIN D) 1000 units tablet Take 1,000 Units by mouth daily.   01/03/2017 at am  . diclofenac sodium (VOLTAREN) 1 % GEL Apply topically 4 (four) times daily as needed.    01/03/2017 at prn   . DULoxetine (CYMBALTA) 60 MG capsule Take 60 mg by mouth daily.    01/03/2017 at am  . levothyroxine (SYNTHROID, LEVOTHROID) 75 MCG tablet Take 1 tablet (75 mcg total) by mouth daily before breakfast. 30 tablet 0 01/03/2017 at am  . omeprazole (PRILOSEC) 20 MG capsule Take 20 mg by mouth daily.   01/03/2017 at am  . potassium chloride (MICRO-K) 10 MEQ CR capsule Take 10 mEq by mouth daily.    01/03/2017 at am  . pravastatin (PRAVACHOL) 40 MG tablet Take 40 mg by mouth at bedtime.    01/03/2017 at 2000  . torsemide (DEMADEX) 20 MG tablet Take 40 mg by mouth daily.   01/03/2017 at am  . warfarin (COUMADIN) 1 MG tablet Take 1 mg by mouth as directed. Taking 54m daily: 156mtablet and 89m59mablet; per RN at Dr. AndTonette Biharifice   01/03/2017 at 1700  . warfarin (COUMADIN) 2 MG tablet Take 2 mg by mouth daily. Taking 3mg34mily: 89mg 19mlet and 1mg t40met per RN at Dr. AndersTonette Biharie   01/03/2017 at 1700  . benzonatate (TESSALON) 200 MG capsule Take 1 capsule by mouth 3 (three) times daily  as needed for cough.   0 prn at prn  . metoprolol succinate (TOPROL-XL) 50 MG 24 hr tablet Take 1 tablet (50 mg total) by mouth daily. Take with or immediately following a meal. (Patient not taking: Reported on 01/04/2017) 30 tablet 0 Not Taking at Unknown time    Current medications: Current Facility-Administered Medications  Medication Dose Route Frequency Provider Last Rate Last Dose  . 0.9 %  sodium chloride infusion   Intravenous Continuous Theodoro Grist, MD   Stopped at 01/05/17 1005  . acetaminophen (TYLENOL) tablet 650 mg  650 mg Oral Q6H PRN Theodoro Grist, MD   325 mg at 01/04/17 2228   Or  . acetaminophen (TYLENOL) suppository 650 mg  650 mg Rectal Q6H PRN Theodoro Grist, MD      . Derrill Memo ON 01/06/2017] amiodarone (PACERONE) tablet 200 mg  200 mg Oral Daily Teodoro Spray, MD      . aspirin EC tablet 81 mg  81 mg Oral Daily Theodoro Grist, MD   81 mg at 01/05/17 1023  . azelastine (ASTELIN) 0.1 % nasal spray 1 spray   1 spray Each Nare Daily PRN Theodoro Grist, MD      . benzonatate (TESSALON) capsule 200 mg  200 mg Oral TID PRN Theodoro Grist, MD      . cholecalciferol (VITAMIN D) tablet 1,000 Units  1,000 Units Oral Daily Theodoro Grist, MD   1,000 Units at 01/05/17 1023  . diclofenac sodium (VOLTAREN) 1 % transdermal gel 4 g  4 g Topical QID PRN Theodoro Grist, MD      . docusate sodium (COLACE) capsule 100 mg  100 mg Oral BID Theodoro Grist, MD   100 mg at 01/05/17 1023  . DULoxetine (CYMBALTA) DR capsule 60 mg  60 mg Oral Daily Theodoro Grist, MD   60 mg at 01/05/17 1023  . furosemide (LASIX) injection 20 mg  20 mg Intravenous Once Sital Mody, MD      . insulin aspart (novoLOG) injection 0-9 Units  0-9 Units Subcutaneous TID WC Bettey Costa, MD   1 Units at 01/05/17 1255  . levothyroxine (SYNTHROID, LEVOTHROID) tablet 75 mcg  75 mcg Oral QAC breakfast Theodoro Grist, MD   75 mcg at 01/05/17 0837  . ondansetron (ZOFRAN) tablet 4 mg  4 mg Oral Q6H PRN Theodoro Grist, MD       Or  . ondansetron (ZOFRAN) injection 4 mg  4 mg Intravenous Q6H PRN Theodoro Grist, MD   4 mg at 01/04/17 1753  . pantoprazole (PROTONIX) EC tablet 40 mg  40 mg Oral QAC breakfast Theodoro Grist, MD   40 mg at 01/05/17 0762  . pravastatin (PRAVACHOL) tablet 40 mg  40 mg Oral QHS Theodoro Grist, MD   40 mg at 01/04/17 2111  . senna (SENOKOT) tablet 8.6 mg  1 tablet Oral BID Theodoro Grist, MD   8.6 mg at 01/05/17 1023  . sodium chloride flush (NS) 0.9 % injection 3 mL  3 mL Intravenous Q12H Theodoro Grist, MD          Allergies: No Known Allergies    Past Medical History: Past Medical History:  Diagnosis Date  . (HFpEF) heart failure with preserved ejection fraction ([place])   . Atrial fibrillation and flutter ([place])   . Breast cancer ([place])   . CKD (chronic kidney disease), stage III   . Diabetes mellitus without complication ([place])   . Hyperlipidemia   . Hypertension   . Low back pain   .  Menopausal and postmenopausal disorder   . PA  (pernicious anemia)   . Thyroid disease   . Valvular heart disease      Past Surgical History: Past Surgical History:  Procedure Laterality Date  . BREAST SURGERY    . GALLBLADDER SURGERY    . MASTECTOMY Left   . THYROIDECTOMY       Family History: Family History  Problem Relation Age of Onset  . Heart attack Father      Social History: Social History   Social History  . Marital status: Widowed    Spouse name: N/A  . Number of children: N/A  . Years of education: N/A   Occupational History  . Not on file.   Social History Main Topics  . Smoking status: Never Smoker  . Smokeless tobacco: Current User    Types: Snuff  . Alcohol use No  . Drug use: No  . Sexual activity: Not on file   Other Topics Concern  . Not on file   Social History Narrative  . No narrative on file     Review of Systems: Patient unable to offer accuate ROS  Vital Signs: Blood pressure (!) 134/94, pulse 87, temperature 98.3 F (36.8 C), temperature source Oral, resp. rate 18, height _0  (1.651 m), weight 70.9 kg (156 lb 6 oz), SpO2 93 %.  Weight trends: Filed Weights   01/04/17 0938 01/04/17 1558 01/05/17 0425  Weight: 75.4 kg (166 lb 3.6 oz) 73 kg (160 lb 14.4 oz) 70.9 kg (156 lb 6 oz)    Physical Exam: General: NAD, resting in bed  Head: Normocephalic, atraumatic.  Eyes: Anicteric, EOMI  Nose: Mucous membranes moist, not inflammed, nonerythematous.  Throat: Oropharynx nonerythematous, no exudate appreciated.   Neck: Supple, trachea midline.  Lungs:  Normal respiratory effort. Clear to auscultation BL without crackles or wheezes.  Heart: Irregular, no rubs  Abdomen:  BS normoactive. Soft, Nondistended, non-tender.  No masses or organomegaly.  Extremities: No pretibial edema.  Neurologic: Awake, alert, will follow commands  Skin: No visible rashes, scars.    Lab results: Basic Metabolic Panel:  Recent Labs Lab 01/04/17 0945 01/04/17 1633 01/05/17 0417  NA 137   --  136  K 3.4*  --  4.1  CL 102  --  103  CO2 22  --  23  GLUCOSE 238*  --  143*  BUN 21*  --  27*  CREATININE 2.00*  --  2.56*  CALCIUM 8.7*  --  8.1*  MG  --  1.9  --     Liver Function Tests:  Recent Labs Lab 01/04/17 0945  AST 37  ALT 13*  ALKPHOS 47  BILITOT 0.6  PROT 6.4*  ALBUMIN 3.8   No results for input(s): LIPASE, AMYLASE in the last 168 hours. No results for input(s): AMMONIA in the last 168 hours.  CBC:  Recent Labs Lab 01/04/17 0945 01/05/17 0417  WBC 11.4* 11.4*  NEUTROABS 8.5*  --   HGB 10.0* 6.6*  HCT 30.7* 20.2*  MCV 91.0 92.0  PLT 292 219    Cardiac Enzymes:  Recent Labs Lab 01/04/17 0945 01/04/17 1633 01/04/17 2153 01/05/17 0417  TROPONINI 0.04* 0.04* 0.04* 0.03*    BNP: Invalid input(s): POCBNP  CBG:  Recent Labs Lab 01/05/17 0802 01/05/17 1223  GLUCAP 135* 136*    Microbiology: Results for orders placed or performed during the hospital encounter of 12/16/15  Rapid Influenza A&B Antigens ([facility] only)     Status: None  Collection Time: 12/16/15  4:19 PM  Result Value Ref Range Status   Influenza A [facility]) NEGATIVE NEGATIVE Final   Influenza B [facility]) NEGATIVE NEGATIVE Final    Coagulation Studies:  Recent Labs  01/04/17 0945 01/05/17 0417  LABPROT 36.7* 43.1*  INR 3.59 4.39*    Urinalysis:  Recent Labs  01/04/17 0945  COLORURINE YELLOW*  LABSPEC 1.012  PHURINE 5.0  GLUCOSEU NEGATIVE  HGBUR SMALL*  BILIRUBINUR NEGATIVE  KETONESUR NEGATIVE  PROTEINUR NEGATIVE  NITRITE NEGATIVE  LEUKOCYTESUR NEGATIVE      Imaging: Ct Abdomen Pelvis Wo Contrast  Result Date: 01/04/2017 CLINICAL DATA:  Fall this morning and yesterday. Right-sided abdominal pain. Pelvic pain. EXAM: CT ABDOMEN AND PELVIS WITHOUT CONTRAST TECHNIQUE: Multidetector CT imaging of the abdomen and pelvis was performed following the standard protocol without IV contrast. COMPARISON:  10/16/2016 CT abdomen/ pelvis. Pelvic CT from earlier today.  FINDINGS: Lower chest: No significant pulmonary nodules or acute consolidative airspace disease. Oral contrast is seen in the lower thoracic esophagus, suggesting dysmotility and/ or gastroesophageal reflux. Hepatobiliary: Normal liver with no liver mass. Cholecystectomy. Bile ducts are stable and within expected post cholecystectomy limits. Common bile duct diameter 7 mm. No radiopaque choledocholithiasis . Pancreas: Diffuse fatty replacement of the pancreas without pancreatic mass or duct dilation, unchanged. Spleen: Normal size. No mass. Adrenals/Urinary Tract: Normal adrenals. Symmetric mild to moderate renal atrophy, unchanged. No hydronephrosis. No renal stones. Two exophytic subcentimeter hypodense renal cortical lesions in the interpolar left kidney, too small to characterize, not appreciably changed, suggesting benign renal cysts, for which no further follow-up is required. No additional contour deforming renal lesions. Normal bladder. Stomach/Bowel: Small hiatal hernia. Otherwise grossly normal stomach. Normal caliber small bowel with no small bowel wall thickening. Normal appendix. Moderate sigmoid diverticulosis, with no definite large bowel wall thickening or pericolonic fat stranding. Moderate stool in the left colon and rectum. Oral contrast progresses to the distal colon. Vascular/Lymphatic: Atherosclerotic nonaneurysmal abdominal aorta. No pathologically enlarged lymph nodes in the abdomen or pelvis. Reproductive: Grossly normal uterus.  No adnexal mass. Other: No pneumoperitoneum, ascites or focal fluid collection. Musculoskeletal: No aggressive appearing focal osseous lesions. Marked degenerative disc disease at T12-L1. No fractures. There is a large partially visualized contusion with hematoma in the subcutaneous lateral right gluteal region, with the subcutaneous hematoma measuring at least 7.3 x 11.7 cm (series 7/image 19). IMPRESSION: 1. Large partially visualized contusion with hematoma in  the subcutaneous lateral right gluteal region. No fractures. 2. Otherwise no acute abnormality. No evidence of bowel obstruction or acute bowel inflammation. Moderate sigmoid diverticulosis, with no evidence of acute diverticulitis. Moderate distal colonic and rectal stool. 3. Aortic atherosclerosis. 4. Small hiatal hernia. Electronically Signed   By: Ilona Sorrel M.D.   On: 01/04/2017 14:43   Ct Head Wo Contrast  Result Date: 01/04/2017 CLINICAL DATA:  Headache and neck pain following a fall today. EXAM: CT HEAD WITHOUT CONTRAST CT CERVICAL SPINE WITHOUT CONTRAST TECHNIQUE: Multidetector CT imaging of the head and cervical spine was performed following the standard protocol without intravenous contrast. Multiplanar CT image reconstructions of the cervical spine were also generated. COMPARISON:  Head CT dated 01/03/2017, brain MR dated 09/10/2016 and cervical spine CT dated 04/05/2012. FINDINGS: CT HEAD FINDINGS Brain: Diffusely enlarged ventricles and subarachnoid spaces. Patchy white matter low density in both cerebral hemispheres. No intracranial hemorrhage, mass lesion or CT evidence of acute infarction. Vascular: No hyperdense vessel or unexpected calcification. Skull: Normal. Negative for fracture or focal lesion. Sinuses/Orbits: No  acute finding. Other: None. CT CERVICAL SPINE FINDINGS Alignment: Stable minimal anterolisthesis at the C2-3 and C6-7 levels. Skull base and vertebrae: No acute fracture. No primary bone lesion or focal pathologic process. Soft tissues and spinal canal: No prevertebral fluid or swelling. No visible canal hematoma. Disc levels: Stable moderate-to-marked disc space narrowing and mild anterior and posterior spur formation at the C3-4 and C4-5 levels. Moderate disc space narrowing at the C5-6 level with mild posterior spur formation without significant change. Mild anterior and posterior spur formation at the C6-7 level. Mild facet degenerative changes at multiple levels. Upper  chest: Clear lung apices. Other: None. IMPRESSION: 1. No skull fracture or intracranial hemorrhage. 2. No cervical spine fracture or traumatic subluxation. 3. Stable diffuse cerebral and cerebellar atrophy and chronic small vessel white matter ischemic changes. 4. Stable cervical spine degenerative changes. Electronically Signed   By: Claudie Revering M.D.   On: 01/04/2017 10:15   Ct Head Wo Contrast  Result Date: 01/03/2017 CLINICAL DATA:  Status post fall. Hit head on curb. Initial encounter. EXAM: CT HEAD WITHOUT CONTRAST TECHNIQUE: Contiguous axial images were obtained from the base of the skull through the vertex without intravenous contrast. COMPARISON:  CT of the head performed 10/06/2016 FINDINGS: Brain: No evidence of acute infarction, hemorrhage, hydrocephalus, extra-axial collection or mass lesion/mass effect. Prominence of the ventricles and sulci reflects mild to moderate cortical volume loss. Cerebellar atrophy is noted. Scattered periventricular white matter change likely reflects small vessel ischemic microangiopathy. The brainstem and fourth ventricle are within normal limits. The basal ganglia are unremarkable in appearance. The cerebral hemispheres demonstrate grossly normal gray-white differentiation. No mass effect or midline shift is seen. Vascular: No hyperdense vessel or unexpected calcification. Skull: There is no evidence of fracture; visualized osseous structures are unremarkable in appearance. Sinuses/Orbits: The visualized portions of the orbits are within normal limits. The paranasal sinuses and mastoid air cells are well-aerated. Other: No significant soft tissue abnormalities are seen. IMPRESSION: 1. No evidence of traumatic intracranial injury or fracture. 2. Mild to moderate cortical volume loss and scattered small vessel ischemic microangiopathy. Next Electronically Signed   By: Garald Balding M.D.   On: 01/03/2017 18:37   Ct Cervical Spine Wo Contrast  Result Date:  01/04/2017 CLINICAL DATA:  Headache and neck pain following a fall today. EXAM: CT HEAD WITHOUT CONTRAST CT CERVICAL SPINE WITHOUT CONTRAST TECHNIQUE: Multidetector CT imaging of the head and cervical spine was performed following the standard protocol without intravenous contrast. Multiplanar CT image reconstructions of the cervical spine were also generated. COMPARISON:  Head CT dated 01/03/2017, brain MR dated 09/10/2016 and cervical spine CT dated 04/05/2012. FINDINGS: CT HEAD FINDINGS Brain: Diffusely enlarged ventricles and subarachnoid spaces. Patchy white matter low density in both cerebral hemispheres. No intracranial hemorrhage, mass lesion or CT evidence of acute infarction. Vascular: No hyperdense vessel or unexpected calcification. Skull: Normal. Negative for fracture or focal lesion. Sinuses/Orbits: No acute finding. Other: None. CT CERVICAL SPINE FINDINGS Alignment: Stable minimal anterolisthesis at the C2-3 and C6-7 levels. Skull base and vertebrae: No acute fracture. No primary bone lesion or focal pathologic process. Soft tissues and spinal canal: No prevertebral fluid or swelling. No visible canal hematoma. Disc levels: Stable moderate-to-marked disc space narrowing and mild anterior and posterior spur formation at the C3-4 and C4-5 levels. Moderate disc space narrowing at the C5-6 level with mild posterior spur formation without significant change. Mild anterior and posterior spur formation at the C6-7 level. Mild facet degenerative changes at multiple  levels. Upper chest: Clear lung apices. Other: None. IMPRESSION: 1. No skull fracture or intracranial hemorrhage. 2. No cervical spine fracture or traumatic subluxation. 3. Stable diffuse cerebral and cerebellar atrophy and chronic small vessel white matter ischemic changes. 4. Stable cervical spine degenerative changes. Electronically Signed   By: Claudie Revering M.D.   On: 01/04/2017 10:15   Ct Pelvis Wo Contrast  Result Date: 01/04/2017 CLINICAL  DATA:  Right hip pain following a fall. Nondisplaced subcapital femoral neck fracture on the left on recent hip radiographs. No fracture seen on the right on the radiographs. EXAM: CT PELVIS WITHOUT CONTRAST TECHNIQUE: Multidetector CT imaging of the pelvis was performed following the standard protocol without intravenous contrast. COMPARISON:  Pelvis and bilateral hip radiographs obtained earlier today. FINDINGS: Urinary Tract: Unremarkable urinary bladder and distal ureters. Multiple bilateral pelvic phleboliths. Bowel: Stool throughout the visualized colon, most pronounced in the rectum. Sigmoid colon diverticula. Unremarkable small bowel loops. Normal appearing appendix. Vascular/Lymphatic: Atheromatous arterial calcifications. No enlarged lymph nodes. Reproductive:  Small, normal appearing uterus and ovaries. Other:  Very small umbilical hernia containing fat. Musculoskeletal: Lower lumbar spine degenerative changes. Sacral meningoceles. Minimal bilateral femoral head and neck junction spur formation. The spur formation on the left mimicked a subcapital femoral neck fracture on the recent radiographs. No fracture or dislocation is seen on either side. IMPRESSION: 1. No fracture or dislocation involving either hip. 2. Minimal bilateral femoral head and neck junction degenerative spur formation. The spur formation on the left mimicked a subcapital femoral neck fracture on the recent radiographs. 3. Sigmoid diverticulosis. Electronically Signed   By: Claudie Revering M.D.   On: 01/04/2017 11:09   US Carotid Bilateral  Result Date: 01/04/2017 CLINICAL DATA:  Stroke. Hypertension, visual disturbance right eye, diabetes, previous tobacco abuse. EXAM: BILATERAL CAROTID DUPLEX ULTRASOUND TECHNIQUE: Pearline Cables scale imaging, color Doppler and duplex ultrasound was performed of bilateral carotid and vertebral arteries in the neck. COMPARISON:  04/07/2005 TECHNIQUE: Quantification of carotid stenosis is based on velocity  parameters that correlate the residual internal carotid diameter with NASCET-based stenosis levels, using the diameter of the distal internal carotid lumen as the denominator for stenosis measurement. The following velocity measurements were obtained: PEAK SYSTOLIC/END DIASTOLIC RIGHT ICA:                     149/17cm/sec CCA:                     57/8IO/NGE SYSTOLIC ICA/CCA RATIO:  9.52 DIASTOLIC ICA/CCA RATIO: 8.41 ECA:                     69cm/sec LEFT ICA:                     120/15cm/sec CCA:                     32/44WN/UUV SYSTOLIC ICA/CCA RATIO:  2.53 DIASTOLIC ICA/CCA RATIO: 6.64 ECA:                     94cm/sec FINDINGS: RIGHT CAROTID ARTERY: No significant plaque or stenosis. Normal waveforms and color Doppler signal. Distal ICA mildly tortuous. RIGHT VERTEBRAL ARTERY:  Normal flow direction and waveform. LEFT CAROTID ARTERY: Mild eccentric plaque in the distal common carotid artery and bulb without significant stenosis. Normal waveforms and color Doppler signal. LEFT VERTEBRAL ARTERY: Normal flow direction and waveform. IMPRESSION: 1. Mild left carotid bifurcation plaque resulting in  less than 50% diameter stenosis. 2.  Antegrade bilateral vertebral arterial flow. Electronically Signed   By: Lucrezia Europe M.D.   On: 01/04/2017 15:02   Dg Chest Port 1 View  Result Date: 01/05/2017 CLINICAL DATA:  Initial evaluation for PICC line adjustment. EXAM: PORTABLE CHEST 1 VIEW COMPARISON:  Prior radiograph from earlier the same day. FINDINGS: a.m. Right subclavian central venous catheter has been retracted, with tip now projecting over the cavoatrial junction. Catheter somewhat looped proximally. Mild cardiomegaly, stable. Mediastinal silhouette within normal limits. Aortic atherosclerosis. Lungs hypoinflated. Mild vascular congestion without overt pulmonary edema. No pleural effusion. No focal infiltrates. No pneumothorax. No acute osseus abnormality. IMPRESSION: 1. Tip of right subclavian centra venous catheter  overlying the cavoatrial junction. 2. Cardiomegaly with mild pulmonary vascular congestion without overt pulmonary edema. 3. Aortic atherosclerosis. Electronically Signed   By: Jeannine Boga M.D.   On: 01/05/2017 15:04   Dg Chest Port 1 View  Result Date: 01/05/2017 CLINICAL DATA:  Patient status post PICC line insertion. EXAM: PORTABLE CHEST 1 VIEW COMPARISON:  Chest radiograph 10/21/2016. FINDINGS: Interval insertion right-sided central venous catheter with tip coursing inferior to the diaphragm. Monitoring leads overlie the patient. Low lung volumes. Stable cardiac and mediastinal contours. Aortic vascular calcifications. Minimal heterogeneous opacities lung bases bilaterally, favored to represent atelectasis. No pleural effusion or pneumothorax. IMPRESSION: New right-sided central venous catheter tip courses inferior to the diaphragm. Subsequent imaging demonstrates interval retraction of the catheter tip. Aortic atherosclerosis. Electronically Signed   By: Lovey Newcomer M.D.   On: 01/05/2017 15:03   Dg Hip Unilat W Or Wo Pelvis 2-3 Views Left  Result Date: 01/04/2017 CLINICAL DATA:  Syncopal episode this morning while walking to the bathroom with family when patient went limp. Patient here yesterday for same. EXAM: DG HIP (WITH OR WITHOUT PELVIS) 2-3V LEFT COMPARISON:  None. FINDINGS: Nondisplaced subcapital left femoral neck fracture. Generalized osteopenia. Joint spaces are maintained. Lower lumbar spine spondylosis partially visualize. No aggressive lytic or sclerotic osseous lesion. Peripheral vascular atherosclerotic disease. IMPRESSION: Nondisplaced subcapital left femoral neck fracture. Electronically Signed   By: Kathreen Devoid   On: 01/04/2017 10:11   Dg Hip Unilat W Or Wo Pelvis 2-3 Views Right  Result Date: 01/04/2017 CLINICAL DATA:  Syncopal episode walking to the bathroom. Bilateral hip pain. Limping. EXAM: DG HIP (WITH OR WITHOUT PELVIS) 2-3V RIGHT COMPARISON:  None. FINDINGS: No acute  fracture or dislocation. Generalized osteopenia. Joint spaces are maintained. Lower lumbar spine spondylosis partially visualize. No aggressive lytic or sclerotic osseous lesion. Peripheral vascular atherosclerotic disease. IMPRESSION: 1.  No acute osseous injury of the right hip. 2. Given the patient's age and osteopenia, IF there is persistent clinical concern for an occult hip fracture, THEN a CT or MRI of the hip is recommended for increased sensitivity. Electronically Signed   By: Kathreen Devoid   On: 01/04/2017 10:09   Dg Hip Unilat With Pelvis 2-3 Views Right  Result Date: 01/03/2017 CLINICAL DATA:  Acute onset of right hip pain after tripping over curb and falling. Initial encounter. EXAM: DG HIP (WITH OR WITHOUT PELVIS) 2-3V RIGHT COMPARISON:  None. FINDINGS: There is no evidence of fracture or dislocation. Both femoral heads are seated normally within their respective acetabula. The proximal right femur appears intact. No significant degenerative change is appreciated. The sacroiliac joints are unremarkable in appearance. The visualized bowel gas pattern is grossly unremarkable in appearance. Scattered vascular calcifications are seen. IMPRESSION: 1. No evidence of fracture or dislocation. 2. Scattered  vascular calcifications seen. Electronically Signed   By: Garald Balding M.D.   On: 01/03/2017 18:48      Assessment & Plan: Pt is a 83 y.o. female with a PMHx of atrial fibrillation, chronic kidney disease stage III, diabetes mellitus type 2, hyperlipidemia, hypertension, low back pain, pernicious anemia, valvular heart disease, who was admitted to [facility] on 01/04/2017 for evaluation of loss of consciousness.   1.  Acute renal failure/CKD stage III baseline Cr 1.17 egfr 41.  Suspect volume depletion as a cause of her acute renal failure.  Patient was noted to be on torsemide at home.  Unclear as to whether she was taking it immediately prior to admission however.  Agree with continuing 0.9 NS at  50cc/hr.  We also plan to check SPEP, and UPEP.  No acute indication for dialysis at the moment.  2.  Anemia chronic kidney disease with acute blood loss anemia.  Patient had gluteal hematoma.  Hemoglobin down to 6.6.  Patient for blood transfusion today.  Continue to monitor CBC closely.  3.  Syncope.  Likely secondary to bradycardia and volume depletion.  Continue IVF hydration for now and work up and management as per cardiology.

## 2017-01-05 NOTE — Care Management (Signed)
Patient initially placed in observation 01/04/17 then admitted 01/05/17.  She presented from home.  Reports falling in her home 3/31 and being seen in the ED and discharged.  Then had episodes of syncope 4/1 and presented again. INR > 3 when presented.   large gluteal hematoma.  Patient had hct drop from 30.7 to 20.2. She has had [facility] in the recent past.  Have reached out to agency to determine if agency still providing services.  Patient is requiring transfusions and vitamin K.

## 2017-01-06 ENCOUNTER — Inpatient Hospital Stay
Admit: 2017-01-06 | Discharge: 2017-01-06 | Disposition: A | Discharge status: Home / Self Care | Payer: Medicare Other | Attending: Internal Medicine | Admitting: Internal Medicine

## 2017-01-06 LAB — BASIC METABOLIC PANEL
ANION GAP: 8 (ref 5–15)
BUN: 31 mg/dL — AB (ref 6–20)
CALCIUM: 8.1 mg/dL — AB (ref 8.9–10.3)
CO2: 23 mmol/L (ref 22–32)
CREATININE: 2.29 mg/dL — AB (ref 0.44–1.00)
Chloride: 104 mmol/L (ref 101–111)
GFR calc Af Amer: 21 mL/min — ABNORMAL LOW (ref >60)
GFR, EST NON AFRICAN AMERICAN: 18 mL/min — AB (ref >60)
GLUCOSE: 127 mg/dL — AB (ref 65–99)
Potassium: 3.8 mmol/L (ref 3.5–5.1)
Sodium: 135 mmol/L (ref 135–145)

## 2017-01-06 LAB — GLUCOSE, CAPILLARY
GLUCOSE-CAPILLARY: 135 mg/dL — AB (ref 65–99)
GLUCOSE-CAPILLARY: 113 mg/dL — AB (ref 65–99)
GLUCOSE-CAPILLARY: 130 mg/dL — AB (ref 65–99)
Glucose-Capillary: 131 mg/dL — ABNORMAL HIGH (ref 65–99)

## 2017-01-06 LAB — TYPE AND SCREEN
ABO/RH(D): O POS
ANTIBODY SCREEN: NEGATIVE
UNIT DIVISION: 0
UNIT DIVISION: 0

## 2017-01-06 LAB — URINE CULTURE: CULTURE: NO GROWTH

## 2017-01-06 LAB — BPAM RBC
BLOOD PRODUCT EXPIRATION DATE: 201804202359
Blood Product Expiration Date: 201804232359
ISSUE DATE / TIME: 201804021818
ISSUE DATE / TIME: 201804021508
UNIT TYPE AND RH: 5100
Unit Type and Rh: 5100

## 2017-01-06 LAB — CBC
HCT: 24.9 % — ABNORMAL LOW (ref 35.0–47.0)
Hemoglobin: 8.5 g/dL — ABNORMAL LOW (ref 12.0–16.0)
MCH: 30 pg (ref 26.0–34.0)
MCHC: 33.9 g/dL (ref 32.0–36.0)
MCV: 88.3 fL (ref 80.0–100.0)
PLATELETS: 176 10*3/uL (ref 150–440)
RBC: 2.82 MIL/uL — AB (ref 3.80–5.20)
RDW: 15.8 % — AB (ref 11.5–14.5)
WBC: 12.8 10*3/uL — ABNORMAL HIGH (ref 3.6–11.0)

## 2017-01-06 LAB — ECHOCARDIOGRAM COMPLETE
HEIGHTINCHES: 65 in
WEIGHTICAEL: 2649.6 [oz_av]

## 2017-01-06 LAB — PROTIME-INR
INR: 1.99
Prothrombin Time: 22.9 seconds — ABNORMAL HIGH (ref 11.4–15.2)

## 2017-01-06 MED ORDER — ENSURE ENLIVE PO LIQD
237.0000 mL | Freq: Two times a day (BID) | ORAL | Status: DC
  Administered 2017-01-06 – 2017-01-07 (×2): 237 mL via ORAL

## 2017-01-06 MED ORDER — HALOPERIDOL LACTATE 5 MG/ML IJ SOLN
0.5000 mg | Freq: Once | INTRAMUSCULAR | Status: DC
  Filled 2017-01-06: qty 1

## 2017-01-06 NOTE — Clinical Social Work Note (Signed)
CSW met with patient and niece who was at bedside.  They have agreed to go to SNF for short term rehab.  CSW was given permission to begin bed search in [facility], patient and her niece stated patient has not been to rehab in the past.  CSW explained to patient and niece what to expect at rehab along with how insurance will pay for the stay.  CSW was informed by PT that they are recommending SNF placement for short term rehab.  CSW to continue to follow patient's progress throughout discharge planning.  Jones Broom. [place], MSW, [place]  01/06/2017 12:24 PM

## 2017-01-06 NOTE — Progress Notes (Signed)
[facility]  ROUNDING NOTE   Subjective:  Patient reports feeling a bit better today. Creatinine currently down to 2.2. Hemoglobin improved to 8.5 posttransfusion.  Objective:  Vital signs in last 24 hours:  Temp:  [98.1 F (36.7 C)-99 F (37.2 C)] 98.2 F (36.8 C) (04/03 0403) Pulse Rate:  [66-100] 84 (04/03 0800) Resp:  [16-25] 20 (04/03 0800) BP: (123-165)/(43-97) 123/53 (04/03 0800) SpO2:  [93 %-99 %] 97 % (04/03 0800) Weight:  [75.1 kg (165 lb 9.6 oz)] 75.1 kg (165 lb 9.6 oz) (04/03 0403)  Weight change: -0.284 kg (-10 oz) Filed Weights   01/04/17 1558 01/05/17 0425 01/06/17 0403  Weight: 73 kg (160 lb 14.4 oz) 70.9 kg (156 lb 6 oz) 75.1 kg (165 lb 9.6 oz)    Intake/Output: I/O last 3 completed shifts: In: 2593.7 [I.V.:1965.8; Blood:627.8] Out: 550 [Urine:550]   Intake/Output this shift:  Total I/O In: 120 [P.O.:120] Out: -   Physical Exam: General: No acute distress  Head: Normocephalic, atraumatic. Moist oral mucosal membranes  Eyes: Anicteric  Neck: Supple, trachea midline  Lungs:  Clear to auscultation, normal effort  Heart: S1S2 irregular  Abdomen:  Soft, nontender  Extremities: trace peripheral edema.  Neurologic: Nonfocal, moving all four extremities  Skin: No lesions       Basic Metabolic Panel:  Recent Labs Lab 01/04/17 0945 01/04/17 1633 01/05/17 0417 01/06/17 0456  NA 137  --  136 135  K 3.4*  --  4.1 3.8  CL 102  --  103 104  CO2 22  --  23 23  GLUCOSE 238*  --  143* 127*  BUN 21*  --  27* 31*  CREATININE 2.00*  --  2.56* 2.29*  CALCIUM 8.7*  --  8.1* 8.1*  MG  --  1.9  --   --     Liver Function Tests:  Recent Labs Lab 01/04/17 0945  AST 37  ALT 13*  ALKPHOS 47  BILITOT 0.6  PROT 6.4*  ALBUMIN 3.8   No results for input(s): LIPASE, AMYLASE in the last 168 hours. No results for input(s): AMMONIA in the last 168 hours.  CBC:  Recent Labs Lab 01/04/17 0945 01/05/17 0417 01/05/17 2313 01/06/17 0456   WBC 11.4* 11.4*  --  12.8*  NEUTROABS 8.5*  --   --   --   HGB 10.0* 6.6* 8.4* 8.5*  HCT 30.7* 20.2*  --  24.9*  MCV 91.0 92.0  --  88.3  PLT 292 219  --  176    Cardiac Enzymes:  Recent Labs Lab 01/04/17 0945 01/04/17 1633 01/04/17 2153 01/05/17 0417  TROPONINI 0.04* 0.04* 0.04* 0.03*    BNP: Invalid input(s): POCBNP  CBG:  Recent Labs Lab 01/05/17 1223 01/05/17 1659 01/05/17 2106 01/06/17 0758 01/06/17 1131  GLUCAP 136* 134* 119* 135* 113*    Microbiology: Results for orders placed or performed during the hospital encounter of 01/04/17  Urine culture     Status: None   Collection Time: 01/04/17  4:15 PM  Result Value Ref Range Status   Specimen Description URINE, CATHETERIZED  Final   Special Requests NONE  Final   Culture   Final    NO GROWTH Performed at [facility], [place] [address] [place], [place] [address]    Report Status 01/06/2017 FINAL  Final    Coagulation Studies:  Recent Labs  01/04/17 0945 01/05/17 0417 01/06/17 0456  LABPROT 36.7* 43.1* 22.9*  INR 3.59 4.39* 1.99  Urinalysis:  Recent Labs  01/04/17 0945  COLORURINE YELLOW*  LABSPEC 1.012  PHURINE 5.0  GLUCOSEU NEGATIVE  HGBUR SMALL*  BILIRUBINUR NEGATIVE  KETONESUR NEGATIVE  PROTEINUR NEGATIVE  NITRITE NEGATIVE  LEUKOCYTESUR NEGATIVE      Imaging: Ct Abdomen Pelvis Wo Contrast  Result Date: 01/04/2017 CLINICAL DATA:  Fall this morning and yesterday. Right-sided abdominal pain. Pelvic pain. EXAM: CT ABDOMEN AND PELVIS WITHOUT CONTRAST TECHNIQUE: Multidetector CT imaging of the abdomen and pelvis was performed following the standard protocol without IV contrast. COMPARISON:  10/16/2016 CT abdomen/ pelvis. Pelvic CT from earlier today. FINDINGS: Lower chest: No significant pulmonary nodules or acute consolidative airspace disease. Oral contrast is seen in the lower thoracic esophagus, suggesting dysmotility and/ or gastroesophageal reflux. Hepatobiliary: Normal  liver with no liver mass. Cholecystectomy. Bile ducts are stable and within expected post cholecystectomy limits. Common bile duct diameter 7 mm. No radiopaque choledocholithiasis . Pancreas: Diffuse fatty replacement of the pancreas without pancreatic mass or duct dilation, unchanged. Spleen: Normal size. No mass. Adrenals/Urinary Tract: Normal adrenals. Symmetric mild to moderate renal atrophy, unchanged. No hydronephrosis. No renal stones. Two exophytic subcentimeter hypodense renal cortical lesions in the interpolar left kidney, too small to characterize, not appreciably changed, suggesting benign renal cysts, for which no further follow-up is required. No additional contour deforming renal lesions. Normal bladder. Stomach/Bowel: Small hiatal hernia. Otherwise grossly normal stomach. Normal caliber small bowel with no small bowel wall thickening. Normal appendix. Moderate sigmoid diverticulosis, with no definite large bowel wall thickening or pericolonic fat stranding. Moderate stool in the left colon and rectum. Oral contrast progresses to the distal colon. Vascular/Lymphatic: Atherosclerotic nonaneurysmal abdominal aorta. No pathologically enlarged lymph nodes in the abdomen or pelvis. Reproductive: Grossly normal uterus.  No adnexal mass. Other: No pneumoperitoneum, ascites or focal fluid collection. Musculoskeletal: No aggressive appearing focal osseous lesions. Marked degenerative disc disease at T12-L1. No fractures. There is a large partially visualized contusion with hematoma in the subcutaneous lateral right gluteal region, with the subcutaneous hematoma measuring at least 7.3 x 11.7 cm (series 7/image 19). IMPRESSION: 1. Large partially visualized contusion with hematoma in the subcutaneous lateral right gluteal region. No fractures. 2. Otherwise no acute abnormality. No evidence of bowel obstruction or acute bowel inflammation. Moderate sigmoid diverticulosis, with no evidence of acute diverticulitis.  Moderate distal colonic and rectal stool. 3. Aortic atherosclerosis. 4. Small hiatal hernia. Electronically Signed   By: Ilona Sorrel M.D.   On: 01/04/2017 14:43   US Carotid Bilateral  Result Date: 01/04/2017 CLINICAL DATA:  Stroke. Hypertension, visual disturbance right eye, diabetes, previous tobacco abuse. EXAM: BILATERAL CAROTID DUPLEX ULTRASOUND TECHNIQUE: Pearline Cables scale imaging, color Doppler and duplex ultrasound was performed of bilateral carotid and vertebral arteries in the neck. COMPARISON:  04/07/2005 TECHNIQUE: Quantification of carotid stenosis is based on velocity parameters that correlate the residual internal carotid diameter with NASCET-based stenosis levels, using the diameter of the distal internal carotid lumen as the denominator for stenosis measurement. The following velocity measurements were obtained: PEAK SYSTOLIC/END DIASTOLIC RIGHT ICA:                     149/17cm/sec CCA:                     03/1RX/YVO SYSTOLIC ICA/CCA RATIO:  5.92 DIASTOLIC ICA/CCA RATIO: 9.24 ECA:                     69cm/sec LEFT ICA:  120/15cm/sec CCA:                     32/44WN/UUV SYSTOLIC ICA/CCA RATIO:  2.53 DIASTOLIC ICA/CCA RATIO: 6.64 ECA:                     94cm/sec FINDINGS: RIGHT CAROTID ARTERY: No significant plaque or stenosis. Normal waveforms and color Doppler signal. Distal ICA mildly tortuous. RIGHT VERTEBRAL ARTERY:  Normal flow direction and waveform. LEFT CAROTID ARTERY: Mild eccentric plaque in the distal common carotid artery and bulb without significant stenosis. Normal waveforms and color Doppler signal. LEFT VERTEBRAL ARTERY: Normal flow direction and waveform. IMPRESSION: 1. Mild left carotid bifurcation plaque resulting in less than 50% diameter stenosis. 2.  Antegrade bilateral vertebral arterial flow. Electronically Signed   By: Lucrezia Europe M.D.   On: 01/04/2017 15:02   Dg Chest Port 1 View  Result Date: 01/05/2017 CLINICAL DATA:  Initial evaluation for PICC line  adjustment. EXAM: PORTABLE CHEST 1 VIEW COMPARISON:  Prior radiograph from earlier the same day. FINDINGS: a.m. Right subclavian central venous catheter has been retracted, with tip now projecting over the cavoatrial junction. Catheter somewhat looped proximally. Mild cardiomegaly, stable. Mediastinal silhouette within normal limits. Aortic atherosclerosis. Lungs hypoinflated. Mild vascular congestion without overt pulmonary edema. No pleural effusion. No focal infiltrates. No pneumothorax. No acute osseus abnormality. IMPRESSION: 1. Tip of right subclavian centra venous catheter overlying the cavoatrial junction. 2. Cardiomegaly with mild pulmonary vascular congestion without overt pulmonary edema. 3. Aortic atherosclerosis. Electronically Signed   By: Jeannine Boga M.D.   On: 01/05/2017 15:04   Dg Chest Port 1 View  Result Date: 01/05/2017 CLINICAL DATA:  Patient status post PICC line insertion. EXAM: PORTABLE CHEST 1 VIEW COMPARISON:  Chest radiograph 10/21/2016. FINDINGS: Interval insertion right-sided central venous catheter with tip coursing inferior to the diaphragm. Monitoring leads overlie the patient. Low lung volumes. Stable cardiac and mediastinal contours. Aortic vascular calcifications. Minimal heterogeneous opacities lung bases bilaterally, favored to represent atelectasis. No pleural effusion or pneumothorax. IMPRESSION: New right-sided central venous catheter tip courses inferior to the diaphragm. Subsequent imaging demonstrates interval retraction of the catheter tip. Aortic atherosclerosis. Electronically Signed   By: Lovey Newcomer M.D.   On: 01/05/2017 15:03     Medications:   . sodium chloride 50 mL/hr at 01/06/17 0601   . amiodarone  200 mg Oral Daily  . aspirin EC  81 mg Oral Daily  . cholecalciferol  1,000 Units Oral Daily  . docusate sodium  100 mg Oral BID  . DULoxetine  60 mg Oral Daily  . haloperidol lactate  0.5 mg Intravenous Once  . insulin aspart  0-9 Units  Subcutaneous TID WC  . levothyroxine  75 mcg Oral QAC breakfast  . pantoprazole  40 mg Oral QAC breakfast  . pravastatin  40 mg Oral QHS  . senna  1 tablet Oral BID  . sodium chloride flush  10-40 mL Intracatheter Q12H   acetaminophen **OR** acetaminophen, azelastine, benzonatate, diclofenac sodium, ondansetron **OR** ondansetron (ZOFRAN) IV, sodium chloride flush  Assessment/ Plan:  83 y.o. female with a PMHx of atrial fibrillation, chronic kidney disease stage III, diabetes mellitus type 2, hyperlipidemia, hypertension, low back pain, pernicious anemia, valvular heart disease, who was admitted to [facility] on 01/04/2017 for evaluation of loss of consciousness.   1.  Acute renal failure/CKD stage III baseline Cr 1.17 egfr 41.  Suspect volume depletion as a cause of her acute renal failure.  Patient was noted to be on torsemide at home.  Unclear as to whether she was taking it immediately prior to admission however.   -  Renal function improved. Continue gentle IV fluid hydration with 0.9 normal saline for now.  Avoid nephrotoxins as possible. Continue to monitor renal function daily.  2.  Anemia chronic kidney disease with acute blood loss anemia.  Patient had gluteal hematoma.  Hemoglobin down to 6.6 at admission. -  Hemoglobin improved posttransfusion. Continue to monitor. We may need to consider Epogen as an outpatient as well.  3.  Syncope. Likely secondary to volume depletion. Patient taken off of diuretics.   LOS: 1 Timmey Lamba 4/3/201812:46 PM

## 2017-01-06 NOTE — Progress Notes (Signed)
Dr. Benjie Karvonen made aware that pt has converted to a.fib on tele/ pt asymptomatic/ pt has hx of a.fib/ will continue to monitor

## 2017-01-06 NOTE — Clinical Social Work Note (Signed)
Clinical Social Work Assessment  Patient Details  Name: Tammie Mcmahon MRN: [id number] Date of Birth: 1928/07/18  Date of referral:  01/06/17               Reason for consult:  Facility Placement                Permission sought to share information with:  Family Supports, Customer service manager Permission granted to share information::  Yes, Verbal Permission Granted  Name::     Danella Deis Sister [phone] or King,Janice Niece [phone]  [phone] or Avel Peace  [phone]    Agency::  SNF admissions  Relationship::     Contact Information:     Housing/Transportation Living arrangements for the past 2 months:  Single Family Home Source of Information:  Patient, Adult Children Patient Interpreter Needed:  None Criminal Activity/Legal Involvement Pertinent to Current Situation/Hospitalization:  No - Comment as needed Significant Relationships:  Adult Children, Other Family Members Lives with:  Self Do you feel safe going back to the place where you live?  No Need for family participation in patient care:  Yes (Comment)  Care giving concerns: Patient feels like she need some short term rehab before she is able to return back home.   Social Worker assessment / plan:  Patient is a 83 year old female who is alert and oriented x3 and able to express her feelings.  Patient's niece was at bedside and is assisting with decision making.  CSW explained role of social worker and process for trying to find SNF bed for rehab.  Patient and her niece was explained how insurance will pay for stay and explained what to expect at SNF.  Patient and niece expressed understanding and did not have any other questions or concerns.  CSW was given permission to begin bed search in [place].  Employment status:  Retired Nurse, adult, Medicaid In [place] PT Recommendations:  [place] / Referral to community resources:   [place]  Patient/Family's Response to care:  Patient and family agreeable to going to SNF for short term rehab.  Patient/Family's Understanding of and Emotional Response to Diagnosis, Current Treatment, and Prognosis: Patient's family expressed being anxious about what the process is and what to expect.  CSW provided psychosocial support to patient and her niece by reassuring them this is normal recovery process for patient needing to go to SNF.  Emotional Assessment Appearance:  Appears stated age Attitude/Demeanor/Rapport:    Affect (typically observed):  Appropriate, Calm Orientation:  Oriented to Self, Oriented to Place, Oriented to  Time Alcohol / Substance use:  Not Applicable Psych involvement (Current and /or in the community):  No (Comment)  Discharge Needs  Concerns to be addressed:  Lack of Support Readmission within the last 30 days:  No Current discharge risk:  Lack of support system Barriers to Discharge:  Continued Medical Work up   Anell Barr 01/06/2017, 4:49 PM

## 2017-01-06 NOTE — Progress Notes (Signed)
Initial Nutrition Assessment  DOCUMENTATION CODES:   Not applicable  INTERVENTION:  1. Ensure Enlive po BID, each supplement provides 350 kcal and 20 grams of protein  NUTRITION DIAGNOSIS:   Inadequate oral intake related to poor appetite as evidenced by per patient/family report.  GOAL:   Patient will meet greater than or equal to 90% of their needs  MONITOR:   PO intake, I & O's, Labs, Weight trends, Supplement acceptance  REASON FOR ASSESSMENT:   Malnutrition Screening Tool    ASSESSMENT:   Tammie Mcmahon  is a 83 y.o. female with a known history of Diastolic CHF, atrial fibrillation, atrial flutter, CK D, diabetes, hypertension, hyperlipidemia, who presents to the hospital with complaints of passing out.  Spoke with [name] at bedside. Unsure of accuracy of history.  She states a normal weight of 188# now down to 165# but unable to quantify a time period for that weight loss. Per chart weight has been relatively stable for the 4 months at least. Denies any history of chewing/swallowing/choking problems - but states she did feel like she choked yesterday.  Claims she normally eats 1.5 meals per day but could not re-iterate what those meals consisted of other than "they weren't much." Reports poor appetite x4 months. Couldn't remember what she ate today. Agreeable to drinking Ensure  Nutrition-Focused physical exam completed. Findings are no fat depletion, no muscle depletion, and no edema.   Labs and medications reviewed: Colace, Vitamin D, Synthroid, Senokot NS @ 61mL/hr  Diet Order:  Diet Heart Room service appropriate? Yes; Fluid consistency: Thin  Skin:  Reviewed, no issues  Last BM:  PTA  Height:   Ht Readings from Last 1 Encounters:  01/04/17 5\' 5"  (1.651 m)    Weight:   Wt Readings from Last 1 Encounters:  01/06/17 165 lb 9.6 oz (75.1 kg)    Ideal Body Weight:  56.81 kg  BMI:  Body mass index is 27.56 kg/m.  Estimated Nutritional Needs:    Kcal:  1600-2000 calories  Protein:  71-85 gm  Fluid:  >/= 1.6L  EDUCATION NEEDS:   No education needs identified at this time  Satira Anis. Fore, MS, RD LDN Inpatient Clinical Dietitian Pager [phone]

## 2017-01-06 NOTE — Evaluation (Signed)
Physical Therapy Evaluation Patient Details Name: Tammie Mcmahon MRN: [id number] DOB: [birth year] Today's Date: 01/06/2017   History of Present Illness  Pt admitted for complaints of passing out x 2 and history of falls. Now diagnosed with acute on chronic renal failure. Pt with history of CHF, afib, DM, and HTN. Pt with negative imagine for L hip at this time. MD attributes falls secondary to bradycardia.   Clinical Impression  Pt is a pleasant 83 year old female who was admitted for acute on chronic renal failure. Pt performs bed mobility, transfers, and ambulation with min assist and RW. Pt demonstrates deficits with strength/balance/mobility. Pt appears more alert this date. Would benefit from skilled PT to address above deficits and promote optimal return to PLOF; recommend transition to STR upon discharge from acute hospitalization.       Follow Up Recommendations SNF    Equipment Recommendations  Rolling walker with 5" wheels    Recommendations for Other Services       Precautions / Restrictions Precautions Precautions: Fall Restrictions Weight Bearing Restrictions: No      Mobility  Bed Mobility Overal bed mobility: Needs Assistance Bed Mobility: Supine to Sit     Supine to sit: Min assist     General bed mobility comments: assist for scooting out towards EOB, guarding noted on R hip. Once seated at EOB, pt able to sit with upright posture.  Transfers Overall transfer level: Needs assistance Equipment used: Rolling walker (2 wheeled) Transfers: Sit to/from Stand Sit to Stand: Min assist         General transfer comment: assist for pushing from seated surface. Once standing, pt able to stand with upright posture  Ambulation/Gait Ambulation/Gait assistance: Min assist Ambulation Distance (Feet): 40 Feet Assistive device: Rolling walker (2 wheeled) Gait Pattern/deviations: Step-through pattern     General Gait Details: Pt able to ambulate around  room, however unsteady needs cues and assist for maintaining balance. Pt fatigues with increased distance.  Stairs            Wheelchair Mobility    Modified Rankin (Stroke Patients Only)       Balance Overall balance assessment: Needs assistance Sitting-balance support: Feet supported Sitting balance-Leahy Scale: Good     Standing balance support: Bilateral upper extremity supported Standing balance-Leahy Scale: Good                               Pertinent Vitals/Pain Pain Assessment: Faces Faces Pain Scale: Hurts little more Pain Location: R hip Pain Descriptors / Indicators: Aching Pain Intervention(s): Limited activity within patient's tolerance    Home Living Family/patient expects to be discharged to:: Private residence Living Arrangements: Alone Available Help at Discharge: Family Type of Home: Apartment Home Access: Level entry     Home Layout: One level Home Equipment: Walker - standard;Cane - single point      Prior Function Level of Independence: Independent with assistive device(s)         Comments: Per pt, she typically uses SPC for ambulation, reports history of falls.     Hand Dominance        Extremity/Trunk Assessment   Upper Extremity Assessment Upper Extremity Assessment: Generalized weakness (B UE grossly 4/5)    Lower Extremity Assessment Lower Extremity Assessment: Generalized weakness (B LE grossly 3+/5 with increased pain on R side)       Communication   Communication: No difficulties  Cognition Arousal/Alertness: Awake/alert Behavior  During Therapy: WFL for tasks assessed/performed Overall Cognitive Status: Within Functional Limits for tasks assessed                                        General Comments      Exercises Other Exercises Other Exercises: Supine ther-ex on B LE including 10 reps including ankle pumps, quad sets, SLRs, and hip abd/add. All ther-ex performed x 10 reps  with min assist for performance.   Assessment/Plan    PT Assessment Patient needs continued PT services  PT Problem List Decreased strength;Decreased balance;Decreased mobility;Pain       PT Treatment Interventions Gait training;DME instruction;Therapeutic exercise    PT Goals (Current goals can be found in the Care Plan section)  Acute Rehab PT Goals Patient Stated Goal: to get stronger PT Goal Formulation: With patient Time For Goal Achievement: 01/20/17 Potential to Achieve Goals: Good    Frequency Min 2X/week   Barriers to discharge        Co-evaluation               End of Session Equipment Utilized During Treatment: Gait belt Activity Tolerance: Patient tolerated treatment well Patient left: in chair;with chair alarm set Nurse Communication: Mobility status PT Visit Diagnosis: Unsteadiness on feet (R26.81);Repeated falls (R29.6);Muscle weakness (generalized) (M62.81);Pain Pain - Right/Left: Right Pain - part of body: Hip    Time: 1046-1106 PT Time Calculation (min) (ACUTE ONLY): 20 min   Charges:   PT Evaluation $PT Eval Moderate Complexity: 1 Procedure PT Treatments $Therapeutic Exercise: 8-22 mins   PT G Codes:        Greggory Stallion, PT, DPT [phone]   Tammie Mcmahon 01/06/2017, 2:44 PM

## 2017-01-06 NOTE — Progress Notes (Signed)
[facility] [place] NAME: Tammie Mcmahon    MR#:  [id number]  DATE OF BIRTH:  March 17, 1928  SUBJECTIVE:    Patient Doing well this morning. She was confused yesterday. Niece is at bedside  REVIEW OF SYSTEMS:    Review of Systems  Constitutional: Negative.  Negative for chills, fever and malaise/fatigue.  HENT: Negative.  Negative for ear discharge, ear pain, hearing loss, nosebleeds and sore throat.   Eyes: Negative.  Negative for blurred vision and pain.  Respiratory: Negative.  Negative for cough, hemoptysis, shortness of breath and wheezing.   Cardiovascular: Negative.  Negative for chest pain, palpitations and leg swelling.  Gastrointestinal: Negative.  Negative for abdominal pain, blood in stool, diarrhea, nausea and vomiting.  Genitourinary: Negative.  Negative for dysuria.  Musculoskeletal: Negative.  Negative for back pain.       Large hematoma   Skin: Negative.   Neurological: Negative for dizziness, tremors, speech change, focal weakness, seizures and headaches.  Endo/Heme/Allergies: Negative.  Does not bruise/bleed easily.  Psychiatric/Behavioral: Negative.  Negative for depression, hallucinations and suicidal ideas.    Tolerating Diet: yes      DRUG ALLERGIES:  No Known Allergies  VITALS:  Blood pressure (!) 143/81, pulse 100, temperature 98.2 F (36.8 C), temperature source Oral, resp. rate (!) 25, height 5\' 5"  (1.651 m), weight 75.1 kg (165 lb 9.6 oz), SpO2 99 %.  PHYSICAL EXAMINATION:   Physical Exam  Constitutional: She is oriented to person, place, and time and well-developed, well-nourished, and in no distress. No distress.  HENT:  Head: Normocephalic.  Eyes: No scleral icterus.  Neck: Normal range of motion. Neck supple. No JVD present. No tracheal deviation present.  Cardiovascular: Normal rate, regular rhythm and normal heart sounds.  Exam reveals no gallop and no friction rub.   No murmur  heard. Pulmonary/Chest: Effort normal and breath sounds normal. No respiratory distress. She has no wheezes. She has no rales. She exhibits no tenderness.  Abdominal: Soft. Bowel sounds are normal. She exhibits no distension and no mass. There is no tenderness. There is no rebound and no guarding.  Musculoskeletal: She exhibits no edema.  Decreased range of motion right hip due to hematoma  Neurological: She is alert and oriented to person, place, and time.  Skin: Skin is warm. No rash noted. No erythema.  Large right gluteal hematoma  Psychiatric: Affect and judgment normal.      LABORATORY PANEL:   CBC  Recent Labs Lab 01/06/17 0456  WBC 12.8*  HGB 8.5*  HCT 24.9*  PLT 176   ------------------------------------------------------------------------------------------------------------------  Chemistries   Recent Labs Lab 01/04/17 0945 01/04/17 1633  01/06/17 0456  NA 137  --   < > 135  K 3.4*  --   < > 3.8  CL 102  --   < > 104  CO2 22  --   < > 23  GLUCOSE 238*  --   < > 127*  BUN 21*  --   < > 31*  CREATININE 2.00*  --   < > 2.29*  CALCIUM 8.7*  --   < > 8.1*  MG  --  1.9  --   --   AST 37  --   --   --   ALT 13*  --   --   --   ALKPHOS 47  --   --   --   BILITOT 0.6  --   --   --   < > =  values in this interval not displayed. ------------------------------------------------------------------------------------------------------------------  Cardiac Enzymes  Recent Labs Lab 01/04/17 1633 01/04/17 2153 01/05/17 0417  TROPONINI 0.04* 0.04* 0.03*   ------------------------------------------------------------------------------------------------------------------  RADIOLOGY:  Ct Abdomen Pelvis Wo Contrast  Result Date: 01/04/2017 CLINICAL DATA:  Fall this morning and yesterday. Right-sided abdominal pain. Pelvic pain. EXAM: CT ABDOMEN AND PELVIS WITHOUT CONTRAST TECHNIQUE: Multidetector CT imaging of the abdomen and pelvis was performed following the standard  protocol without IV contrast. COMPARISON:  10/16/2016 CT abdomen/ pelvis. Pelvic CT from earlier today. FINDINGS: Lower chest: No significant pulmonary nodules or acute consolidative airspace disease. Oral contrast is seen in the lower thoracic esophagus, suggesting dysmotility and/ or gastroesophageal reflux. Hepatobiliary: Normal liver with no liver mass. Cholecystectomy. Bile ducts are stable and within expected post cholecystectomy limits. Common bile duct diameter 7 mm. No radiopaque choledocholithiasis . Pancreas: Diffuse fatty replacement of the pancreas without pancreatic mass or duct dilation, unchanged. Spleen: Normal size. No mass. Adrenals/Urinary Tract: Normal adrenals. Symmetric mild to moderate renal atrophy, unchanged. No hydronephrosis. No renal stones. Two exophytic subcentimeter hypodense renal cortical lesions in the interpolar left kidney, too small to characterize, not appreciably changed, suggesting benign renal cysts, for which no further follow-up is required. No additional contour deforming renal lesions. Normal bladder. Stomach/Bowel: Small hiatal hernia. Otherwise grossly normal stomach. Normal caliber small bowel with no small bowel wall thickening. Normal appendix. Moderate sigmoid diverticulosis, with no definite large bowel wall thickening or pericolonic fat stranding. Moderate stool in the left colon and rectum. Oral contrast progresses to the distal colon. Vascular/Lymphatic: Atherosclerotic nonaneurysmal abdominal aorta. No pathologically enlarged lymph nodes in the abdomen or pelvis. Reproductive: Grossly normal uterus.  No adnexal mass. Other: No pneumoperitoneum, ascites or focal fluid collection. Musculoskeletal: No aggressive appearing focal osseous lesions. Marked degenerative disc disease at T12-L1. No fractures. There is a large partially visualized contusion with hematoma in the subcutaneous lateral right gluteal region, with the subcutaneous hematoma measuring at least  7.3 x 11.7 cm (series 7/image 19). IMPRESSION: 1. Large partially visualized contusion with hematoma in the subcutaneous lateral right gluteal region. No fractures. 2. Otherwise no acute abnormality. No evidence of bowel obstruction or acute bowel inflammation. Moderate sigmoid diverticulosis, with no evidence of acute diverticulitis. Moderate distal colonic and rectal stool. 3. Aortic atherosclerosis. 4. Small hiatal hernia. Electronically Signed   By: Ilona Sorrel M.D.   On: 01/04/2017 14:43   Ct Head Wo Contrast  Result Date: 01/04/2017 CLINICAL DATA:  Headache and neck pain following a fall today. EXAM: CT HEAD WITHOUT CONTRAST CT CERVICAL SPINE WITHOUT CONTRAST TECHNIQUE: Multidetector CT imaging of the head and cervical spine was performed following the standard protocol without intravenous contrast. Multiplanar CT image reconstructions of the cervical spine were also generated. COMPARISON:  Head CT dated 01/03/2017, brain MR dated 09/10/2016 and cervical spine CT dated 04/05/2012. FINDINGS: CT HEAD FINDINGS Brain: Diffusely enlarged ventricles and subarachnoid spaces. Patchy white matter low density in both cerebral hemispheres. No intracranial hemorrhage, mass lesion or CT evidence of acute infarction. Vascular: No hyperdense vessel or unexpected calcification. Skull: Normal. Negative for fracture or focal lesion. Sinuses/Orbits: No acute finding. Other: None. CT CERVICAL SPINE FINDINGS Alignment: Stable minimal anterolisthesis at the C2-3 and C6-7 levels. Skull base and vertebrae: No acute fracture. No primary bone lesion or focal pathologic process. Soft tissues and spinal canal: No prevertebral fluid or swelling. No visible canal hematoma. Disc levels: Stable moderate-to-marked disc space narrowing and mild anterior and posterior spur formation at the C3-4  and C4-5 levels. Moderate disc space narrowing at the C5-6 level with mild posterior spur formation without significant change. Mild anterior and  posterior spur formation at the C6-7 level. Mild facet degenerative changes at multiple levels. Upper chest: Clear lung apices. Other: None. IMPRESSION: 1. No skull fracture or intracranial hemorrhage. 2. No cervical spine fracture or traumatic subluxation. 3. Stable diffuse cerebral and cerebellar atrophy and chronic small vessel white matter ischemic changes. 4. Stable cervical spine degenerative changes. Electronically Signed   By: Claudie Revering M.D.   On: 01/04/2017 10:15   Ct Cervical Spine Wo Contrast  Result Date: 01/04/2017 CLINICAL DATA:  Headache and neck pain following a fall today. EXAM: CT HEAD WITHOUT CONTRAST CT CERVICAL SPINE WITHOUT CONTRAST TECHNIQUE: Multidetector CT imaging of the head and cervical spine was performed following the standard protocol without intravenous contrast. Multiplanar CT image reconstructions of the cervical spine were also generated. COMPARISON:  Head CT dated 01/03/2017, brain MR dated 09/10/2016 and cervical spine CT dated 04/05/2012. FINDINGS: CT HEAD FINDINGS Brain: Diffusely enlarged ventricles and subarachnoid spaces. Patchy white matter low density in both cerebral hemispheres. No intracranial hemorrhage, mass lesion or CT evidence of acute infarction. Vascular: No hyperdense vessel or unexpected calcification. Skull: Normal. Negative for fracture or focal lesion. Sinuses/Orbits: No acute finding. Other: None. CT CERVICAL SPINE FINDINGS Alignment: Stable minimal anterolisthesis at the C2-3 and C6-7 levels. Skull base and vertebrae: No acute fracture. No primary bone lesion or focal pathologic process. Soft tissues and spinal canal: No prevertebral fluid or swelling. No visible canal hematoma. Disc levels: Stable moderate-to-marked disc space narrowing and mild anterior and posterior spur formation at the C3-4 and C4-5 levels. Moderate disc space narrowing at the C5-6 level with mild posterior spur formation without significant change. Mild anterior and posterior  spur formation at the C6-7 level. Mild facet degenerative changes at multiple levels. Upper chest: Clear lung apices. Other: None. IMPRESSION: 1. No skull fracture or intracranial hemorrhage. 2. No cervical spine fracture or traumatic subluxation. 3. Stable diffuse cerebral and cerebellar atrophy and chronic small vessel white matter ischemic changes. 4. Stable cervical spine degenerative changes. Electronically Signed   By: Claudie Revering M.D.   On: 01/04/2017 10:15   Ct Pelvis Wo Contrast  Result Date: 01/04/2017 CLINICAL DATA:  Right hip pain following a fall. Nondisplaced subcapital femoral neck fracture on the left on recent hip radiographs. No fracture seen on the right on the radiographs. EXAM: CT PELVIS WITHOUT CONTRAST TECHNIQUE: Multidetector CT imaging of the pelvis was performed following the standard protocol without intravenous contrast. COMPARISON:  Pelvis and bilateral hip radiographs obtained earlier today. FINDINGS: Urinary Tract: Unremarkable urinary bladder and distal ureters. Multiple bilateral pelvic phleboliths. Bowel: Stool throughout the visualized colon, most pronounced in the rectum. Sigmoid colon diverticula. Unremarkable small bowel loops. Normal appearing appendix. Vascular/Lymphatic: Atheromatous arterial calcifications. No enlarged lymph nodes. Reproductive:  Small, normal appearing uterus and ovaries. Other:  Very small umbilical hernia containing fat. Musculoskeletal: Lower lumbar spine degenerative changes. Sacral meningoceles. Minimal bilateral femoral head and neck junction spur formation. The spur formation on the left mimicked a subcapital femoral neck fracture on the recent radiographs. No fracture or dislocation is seen on either side. IMPRESSION: 1. No fracture or dislocation involving either hip. 2. Minimal bilateral femoral head and neck junction degenerative spur formation. The spur formation on the left mimicked a subcapital femoral neck fracture on the recent  radiographs. 3. Sigmoid diverticulosis. Electronically Signed   By: Percell Locus.D.  On: 01/04/2017 11:09   US Carotid Bilateral  Result Date: 01/04/2017 CLINICAL DATA:  Stroke. Hypertension, visual disturbance right eye, diabetes, previous tobacco abuse. EXAM: BILATERAL CAROTID DUPLEX ULTRASOUND TECHNIQUE: Pearline Cables scale imaging, color Doppler and duplex ultrasound was performed of bilateral carotid and vertebral arteries in the neck. COMPARISON:  04/07/2005 TECHNIQUE: Quantification of carotid stenosis is based on velocity parameters that correlate the residual internal carotid diameter with NASCET-based stenosis levels, using the diameter of the distal internal carotid lumen as the denominator for stenosis measurement. The following velocity measurements were obtained: PEAK SYSTOLIC/END DIASTOLIC RIGHT ICA:                     149/17cm/sec CCA:                     62/7OJ/JKK SYSTOLIC ICA/CCA RATIO:  9.38 DIASTOLIC ICA/CCA RATIO: 1.82 ECA:                     69cm/sec LEFT ICA:                     120/15cm/sec CCA:                     99/37JI/RCV SYSTOLIC ICA/CCA RATIO:  8.93 DIASTOLIC ICA/CCA RATIO: 8.10 ECA:                     94cm/sec FINDINGS: RIGHT CAROTID ARTERY: No significant plaque or stenosis. Normal waveforms and color Doppler signal. Distal ICA mildly tortuous. RIGHT VERTEBRAL ARTERY:  Normal flow direction and waveform. LEFT CAROTID ARTERY: Mild eccentric plaque in the distal common carotid artery and bulb without significant stenosis. Normal waveforms and color Doppler signal. LEFT VERTEBRAL ARTERY: Normal flow direction and waveform. IMPRESSION: 1. Mild left carotid bifurcation plaque resulting in less than 50% diameter stenosis. 2.  Antegrade bilateral vertebral arterial flow. Electronically Signed   By: Lucrezia Europe M.D.   On: 01/04/2017 15:02   Dg Chest Port 1 View  Result Date: 01/05/2017 CLINICAL DATA:  Initial evaluation for PICC line adjustment. EXAM: PORTABLE CHEST 1 VIEW COMPARISON:   Prior radiograph from earlier the same day. FINDINGS: a.m. Right subclavian central venous catheter has been retracted, with tip now projecting over the cavoatrial junction. Catheter somewhat looped proximally. Mild cardiomegaly, stable. Mediastinal silhouette within normal limits. Aortic atherosclerosis. Lungs hypoinflated. Mild vascular congestion without overt pulmonary edema. No pleural effusion. No focal infiltrates. No pneumothorax. No acute osseus abnormality. IMPRESSION: 1. Tip of right subclavian centra venous catheter overlying the cavoatrial junction. 2. Cardiomegaly with mild pulmonary vascular congestion without overt pulmonary edema. 3. Aortic atherosclerosis. Electronically Signed   By: Jeannine Boga M.D.   On: 01/05/2017 15:04   Dg Chest Port 1 View  Result Date: 01/05/2017 CLINICAL DATA:  Patient status post PICC line insertion. EXAM: PORTABLE CHEST 1 VIEW COMPARISON:  Chest radiograph 10/21/2016. FINDINGS: Interval insertion right-sided central venous catheter with tip coursing inferior to the diaphragm. Monitoring leads overlie the patient. Low lung volumes. Stable cardiac and mediastinal contours. Aortic vascular calcifications. Minimal heterogeneous opacities lung bases bilaterally, favored to represent atelectasis. No pleural effusion or pneumothorax. IMPRESSION: New right-sided central venous catheter tip courses inferior to the diaphragm. Subsequent imaging demonstrates interval retraction of the catheter tip. Aortic atherosclerosis. Electronically Signed   By: Lovey Newcomer M.D.   On: 01/05/2017 15:03   Dg Hip Unilat W Or Wo Pelvis 2-3 Views Left  Result Date:  01/04/2017 CLINICAL DATA:  Syncopal episode this morning while walking to the bathroom with family when patient went limp. Patient here yesterday for same. EXAM: DG HIP (WITH OR WITHOUT PELVIS) 2-3V LEFT COMPARISON:  None. FINDINGS: Nondisplaced subcapital left femoral neck fracture. Generalized osteopenia. Joint spaces are  maintained. Lower lumbar spine spondylosis partially visualize. No aggressive lytic or sclerotic osseous lesion. Peripheral vascular atherosclerotic disease. IMPRESSION: Nondisplaced subcapital left femoral neck fracture. Electronically Signed   By: Kathreen Devoid   On: 01/04/2017 10:11   Dg Hip Unilat W Or Wo Pelvis 2-3 Views Right  Result Date: 01/04/2017 CLINICAL DATA:  Syncopal episode walking to the bathroom. Bilateral hip pain. Limping. EXAM: DG HIP (WITH OR WITHOUT PELVIS) 2-3V RIGHT COMPARISON:  None. FINDINGS: No acute fracture or dislocation. Generalized osteopenia. Joint spaces are maintained. Lower lumbar spine spondylosis partially visualize. No aggressive lytic or sclerotic osseous lesion. Peripheral vascular atherosclerotic disease. IMPRESSION: 1.  No acute osseous injury of the right hip. 2. Given the patient's age and osteopenia, IF there is persistent clinical concern for an occult hip fracture, THEN a CT or MRI of the hip is recommended for increased sensitivity. Electronically Signed   By: Kathreen Devoid   On: 01/04/2017 10:09     ASSESSMENT AND PLAN:   83 year old very pleasant female past medical history significant for diastolic heart failure, PAF on coumadin and diabetes who presents with weakness after fall 2 days ago and found to have large right gluteal hematoma   1. Acute blood loss anemia due to extensive right gluteal hematoma He is status post 2 units PRBC. Hemoglobin stable this morning. Repeat CBC for a.m.   2. Right gluteal hematoma: Surgery consultation who stated. No new surgical intervention. Continue to monitor CBC  3. Syncope, from bradycardia METOPROLOL stopped Continue telemetry monitoring  4. Elevated troponin due to be demand ischemia and not ACS  5. Acute kidney injury in the setting of hematoma and fall use of torsemide Creatinie  slowly improving Follow Up on renal ultrasound   6. History of PAF on Coumadin INR 1.99 Patient given vitamin K  yesterday. To stop Coumadin for now. After discharge patient will follow-up with her cardiologist and once hematoma has resolved she may resume her Coumadin Continue AMIODARONE  7. Diabetes with hemoglobin A1c of 6.6 sliding scale insulin   8. Hypothyroidism: Continue Synthroid   9. Hyperlipidemia: Continue pravastatin   10. Depression: Continue Cymbalta   11. Chronic diastolic heart failure with preserved ejection fraction: Diuretics on hold due to acute kidney injury  She is euvolemic  Cont daily weight  Patient likely need skilled nursing facility at discharge  Management plans discussed with the patient and grand daughter and she is in agreement.  CODE STATUS: FULL  TOTAL TIME TAKING CARE OF THIS PATIENT: 28 minutes.   D/w family and dr Ubaldo Glassing  And dr Holley Raring  POSSIBLE D/C 2 days, DEPENDING ON CLINICAL CONDITION.   Dola Mcmahon M.D on 01/06/2017 at 9:56 AM  Between 7am to 6pm - Pager - [phone] After 6pm go to [email or web address] - password [email or web address] [facility]  Office  [phone]  CC: Primary care physician; Kirk Ruths., MD  Note: This dictation was prepared with Dragon dictation along with smaller phrase technology. Any transcriptional errors that result from this process are unintentional.

## 2017-01-06 NOTE — NC FL2 (Signed)
[place] LEVEL OF CARE SCREENING TOOL     IDENTIFICATION  Patient Name: Tammie Mcmahon Birthdate: 07/21/1928 Sex: female Admission Date (Current Location): 01/04/2017  [place] and [place] Number:  Selena Lesser [id number] Im[place] and Address:  [facility], [address] [place], [place] [address]      Provider Number: [id number]  Attending Physician Name and Address:  Bettey Costa, MD  Relative Name and Phone Number:  Danella Deis Sister [phone], or King,Janice Niece [phone]  [phone] or Avel Peace  [phone]     Current Level of Care: Hospital Recommended Level of Care: [place] Prior Approval Number:    Date Approved/Denied:   PASRR Number: [id number]  Discharge Plan: SNF    Current Diagnoses: Patient Active Problem List   Diagnosis Date Noted  . Syncope 01/04/2017  . Lower abdominal pain 01/04/2017  . CKD (chronic kidney disease), stage III 01/04/2017  . Elevated troponin 01/04/2017  . Hypokalemia 01/04/2017  . Leukocytosis 01/04/2017  . Dysuria 01/04/2017  . Constipation 01/04/2017  . Symptomatic bradycardia 10/22/2016  . Atrial fibrillation ([place]) 10/22/2016  . Heart failure with preserved ejection fraction ([place]) 10/22/2016  . Hypertension 10/22/2016  . Hyperlipidemia 10/22/2016  . Acute on chronic renal failure ([place]) 10/22/2016  . Other specified cardiac arrhythmias (CODE) 10/16/2016  . Confusion 09/10/2016    Orientation RESPIRATION BLADDER Height & Weight     Self, Time, Situation, Place  Normal Incontinent Weight: 165 lb 9.6 oz (75.1 kg) Height:  5\' 5"  (165.1 cm)  BEHAVIORAL SYMPTOMS/MOOD NEUROLOGICAL BOWEL NUTRITION STATUS      Incontinent Diet (Cardiac)  AMBULATORY STATUS COMMUNICATION OF NEEDS Skin   Limited Assist Verbally Normal                       Personal Care Assistance Level of Assistance  Bathing, Feeding, Dressing Bathing Assistance: Limited  assistance Feeding assistance: Independent Dressing Assistance: Limited assistance     Functional Limitations Info  Sight, Hearing, Speech Sight Info: Adequate Hearing Info: Adequate Speech Info: Adequate    SPECIAL CARE FACTORS FREQUENCY  PT (By licensed PT), OT (By licensed OT)     PT Frequency: 5x a week OT Frequency: 5x a week            Contractures Contractures Info: Not present    Additional Factors Info  Code Status, Allergies, Insulin Sliding Scale Code Status Info: Full Code Allergies Info: NKA   Insulin Sliding Scale Info: insulin aspart (novoLOG) injection 0-9 Units 3x a day with meals       Current Medications (01/06/2017):  This is the current hospital active medication list Current Facility-Administered Medications  Medication Dose Route Frequency Provider Last Rate Last Dose  . 0.9 %  sodium chloride infusion   Intravenous Continuous Theodoro Grist, MD 50 mL/hr at 01/06/17 0601    . acetaminophen (TYLENOL) tablet 650 mg  650 mg Oral Q6H PRN Theodoro Grist, MD   325 mg at 01/04/17 2228   Or  . acetaminophen (TYLENOL) suppository 650 mg  650 mg Rectal Q6H PRN Theodoro Grist, MD      . amiodarone (PACERONE) tablet 200 mg  200 mg Oral Daily Teodoro Spray, MD   200 mg at 01/06/17 0933  . aspirin EC tablet 81 mg  81 mg Oral Daily Theodoro Grist, MD   81 mg at 01/06/17 0933  . azelastine (ASTELIN) 0.1 % nasal spray 1 spray  1 spray Each Nare Daily PRN Theodoro Grist,  MD      . benzonatate (TESSALON) capsule 200 mg  200 mg Oral TID PRN Theodoro Grist, MD      . cholecalciferol (VITAMIN D) tablet 1,000 Units  1,000 Units Oral Daily Theodoro Grist, MD   1,000 Units at 01/06/17 0933  . diclofenac sodium (VOLTAREN) 1 % transdermal gel 4 g  4 g Topical QID PRN Theodoro Grist, MD      . docusate sodium (COLACE) capsule 100 mg  100 mg Oral BID Theodoro Grist, MD   100 mg at 01/06/17 0933  . DULoxetine (CYMBALTA) DR capsule 60 mg  60 mg Oral Daily Theodoro Grist, MD   60 mg at  01/06/17 0933  . haloperidol lactate (HALDOL) injection 0.5 mg  0.5 mg Intravenous Once Alexis Hugelmeyer, DO      . insulin aspart (novoLOG) injection 0-9 Units  0-9 Units Subcutaneous TID WC Bettey Costa, MD   1 Units at 01/06/17 0932  . levothyroxine (SYNTHROID, LEVOTHROID) tablet 75 mcg  75 mcg Oral QAC breakfast Theodoro Grist, MD   75 mcg at 01/06/17 0933  . ondansetron (ZOFRAN) tablet 4 mg  4 mg Oral Q6H PRN Theodoro Grist, MD       Or  . ondansetron (ZOFRAN) injection 4 mg  4 mg Intravenous Q6H PRN Theodoro Grist, MD   4 mg at 01/04/17 1753  . pantoprazole (PROTONIX) EC tablet 40 mg  40 mg Oral QAC breakfast Theodoro Grist, MD   40 mg at 01/06/17 0933  . pravastatin (PRAVACHOL) tablet 40 mg  40 mg Oral QHS Theodoro Grist, MD   40 mg at 01/05/17 2316  . senna (SENOKOT) tablet 8.6 mg  1 tablet Oral BID Theodoro Grist, MD   8.6 mg at 01/06/17 0933  . sodium chloride flush (NS) 0.9 % injection 10-40 mL  10-40 mL Intracatheter Q12H Bettey Costa, MD   10 mL at 01/06/17 0934  . sodium chloride flush (NS) 0.9 % injection 10-40 mL  10-40 mL Intracatheter PRN Bettey Costa, MD         Discharge Medications: Please see discharge summary for a list of discharge medications.  Relevant Imaging Results:  Relevant Lab Results:   Additional Information SSN [id number]  Ross Ludwig, [place]

## 2017-01-06 NOTE — Plan of Care (Signed)
Problem: Skin Integrity: Goal: Risk for impaired skin integrity will decrease Outcome: Not Progressing Right hip/abdomen hematoma remains.  No increase in size noted this shift.  Area tender to touch.

## 2017-01-06 NOTE — Plan of Care (Signed)
Problem: Safety: Goal: Ability to remain free from injury will improve Outcome: Progressing More cooperative and able to be redirected to remain in bed.  Repeat dose of Haldol not given after patient able to follow commands and nap for short intervals.  Telesitter remains in room.

## 2017-01-06 NOTE — Evaluation (Signed)
Occupational Therapy Evaluation Patient Details Name: Tammie Mcmahon MRN: [id number] DOB: 1928/07/30 Today's Date: 01/06/2017    History of Present Illness Pt admitted for complaints of passing out x 2 and history of falls. Now diagnosed with acute on chronic renal failure. Pt with history of CHF, afib, DM, and HTN. Pt with negative imagine for L hip at this time. MD attributes falls secondary to bradycardia.    Clinical Impression   Pt seen for OT evaluation this date. Pt was indep at baseline, living alone using RW occasionally and admits to being a "furniture walker". 2-3 falls reported in past 12 months. Pt has not driven in past 3 months due to A-fib. Sister drives her to doctor appts, pharmacy. Pt presents with decreased strength, activity tolerance, safety awareness, with an increased risk of falls and increased need for assist with ADL to maximize safety. Pt would benefit from skilled OT services to address noted impairments and functional deficits in order to maximize return to PLOF and minimize falls risk.     Follow Up Recommendations  SNF    Equipment Recommendations  None recommended by OT    Recommendations for Other Services       Precautions / Restrictions Precautions Precautions: Fall Restrictions Weight Bearing Restrictions: No      Mobility Bed Mobility      General bed mobility comments: did not assess, pt in recliner for session  Transfers         General transfer comment: did not attempt, pt politely declined out of recliner due to fatigue    Balance                              ADL either performed or assessed with clinical judgement   ADL Overall ADL's : Needs assistance/impaired                                       General ADL Comments: Pt declined out of recliner and ADL assessment this date, agreeable to trialing more tomorrow due to fatigue after working with PT     Vision Baseline Vision/History:  Wears glasses Wears Glasses: At all times Patient Visual Report: No change from baseline Vision Assessment?: No apparent visual deficits     Perception     Praxis Praxis Praxis tested?: Within functional limits    Pertinent Vitals/Pain Pain Assessment: 0-10 Pain Score: 1  Faces Pain Scale: Hurts little more Pain Location: lower back and lower abdomen Pain Descriptors / Indicators: Aching Pain Intervention(s): Limited activity within patient's tolerance;Monitored during session     Hand Dominance Right   Extremity/Trunk Assessment Upper Extremity Assessment Upper Extremity Assessment: Generalized weakness (grossly 4-/5 bilaterally, AROM WFL)   Lower Extremity Assessment Lower Extremity Assessment: Defer to PT evaluation;Generalized weakness       Communication Communication Communication: No difficulties   Cognition Arousal/Alertness: Awake/alert Behavior During Therapy: WFL for tasks assessed/performed Overall Cognitive Status: Impaired/Different from baseline Area of Impairment: Orientation                 Orientation Level: Disoriented to;Situation;Time             General Comments: some confusion/agitation overnight, improved this date   General Comments       Exercises    Shoulder Instructions      Home Living Family/patient expects to be  discharged to:: Private residence Living Arrangements: Alone Available Help at Discharge: Family;Available 24 hours/day Type of Home: Apartment Home Access: Level entry     Home Layout: One level     Bathroom Shower/Tub: Teacher, early years/pre: Handicapped height (with riser w/ bilat arm rails) Bathroom Accessibility: Yes How Accessible: Accessible via walker Home Equipment: Walker - standard;Cane - single point;Bedside commode          Prior Functioning/Environment Level of Independence: Independent with assistive device(s)        Comments: Per PT note, pt reports typically uses  SPC for ambulation but during OT pt/family report PRN use of RW, and pt admits to "furniture walking", reports history of falls 2-3x in past 12 months; pt reports difficulty "keeping up" with her medications, hasn't driven in past 3 months secondary to A-fib per pt report (has sister to take her to pharmacy, dr appts); goes out to eat "at the cafe", or prepares simple meals (doesn't like to cook much), and enjoys reading and watching tv        OT Problem List: Decreased strength;Decreased safety awareness;Decreased activity tolerance;Decreased knowledge of use of DME or AE      OT Treatment/Interventions: Self-care/ADL training;DME and/or AE instruction;Energy conservation;Patient/family education    OT Goals(Current goals can be found in the care plan section) Acute Rehab OT Goals Patient Stated Goal: to get stronger OT Goal Formulation: With patient/family Time For Goal Achievement: 01/20/17 Potential to Achieve Goals: Good  OT Frequency: Min 1X/week   Barriers to D/C:            Co-evaluation              End of Session    Activity Tolerance: Patient limited by fatigue Patient left: in chair;with call bell/phone within reach;with chair alarm set;with family/visitor present  OT Visit Diagnosis: Other abnormalities of gait and mobility (R26.89);Muscle weakness (generalized) (M62.81)                Time: 0962-8366 OT Time Calculation (min): 27 min Charges:  OT General Charges $OT Visit: 1 Procedure OT Evaluation $OT Eval Moderate Complexity: 1 Procedure OT Treatments $Self Care/Home Management : 8-22 mins G-Codes:     Jeni Salles, MPH, MS, OTR/L ascom [phone] 01/06/17, 4:18 PM

## 2017-01-06 NOTE — Progress Notes (Signed)
Awake and remains confused.  Frequent attempts to get out of bed..  Unable to reorient.  Dr Ara Kussmaul notified.  Orders given.

## 2017-01-06 NOTE — Progress Notes (Signed)
Pt observed tampering with IV tubing from central line site.  Pt confused and agitated with frequent, impulsive attempts to get out of bed.  Unable to reorient/redirect even with nephew Ray at bedside. Dr Manuella Ghazi notified, telesitter requested and activated.

## 2017-01-06 NOTE — Progress Notes (Signed)
*  PRELIMINARY RESULTS* Echocardiogram 2D Echocardiogram has been performed.  Tammie Mcmahon 01/06/2017, 11:25 AM

## 2017-01-07 ENCOUNTER — Encounter
Admission: RE | Admit: 2017-01-07 | Discharge: 2017-01-07 | Disposition: A | Discharge status: Home / Self Care | Payer: Medicare Other | Source: Ambulatory Visit | Attending: Internal Medicine | Admitting: Internal Medicine

## 2017-01-07 LAB — CBC
HEMATOCRIT: 24.8 % — AB (ref 35.0–47.0)
HEMOGLOBIN: 8.5 g/dL — AB (ref 12.0–16.0)
MCH: 30.3 pg (ref 26.0–34.0)
MCHC: 34.1 g/dL (ref 32.0–36.0)
MCV: 88.9 fL (ref 80.0–100.0)
Platelets: 196 10*3/uL (ref 150–440)
RBC: 2.79 MIL/uL — ABNORMAL LOW (ref 3.80–5.20)
RDW: 16 % — AB (ref 11.5–14.5)
WBC: 14.3 10*3/uL — ABNORMAL HIGH (ref 3.6–11.0)

## 2017-01-07 LAB — GLUCOSE, CAPILLARY
GLUCOSE-CAPILLARY: 132 mg/dL — AB (ref 65–99)
Glucose-Capillary: 120 mg/dL — ABNORMAL HIGH (ref 65–99)

## 2017-01-07 LAB — BASIC METABOLIC PANEL
Anion gap: 6 (ref 5–15)
BUN: 24 mg/dL — ABNORMAL HIGH (ref 6–20)
CALCIUM: 8.3 mg/dL — AB (ref 8.9–10.3)
CHLORIDE: 106 mmol/L (ref 101–111)
CO2: 24 mmol/L (ref 22–32)
CREATININE: 1.38 mg/dL — AB (ref 0.44–1.00)
GFR calc non Af Amer: 34 mL/min — ABNORMAL LOW (ref >60)
GFR, EST AFRICAN AMERICAN: 39 mL/min — AB (ref >60)
GLUCOSE: 123 mg/dL — AB (ref 65–99)
Potassium: 4 mmol/L (ref 3.5–5.1)
Sodium: 136 mmol/L (ref 135–145)

## 2017-01-07 MED ORDER — POLYETHYLENE GLYCOL 3350 17 G PO PACK: 17.0000 g | PACK | Freq: Every day | ORAL | 0 refills | Status: DC | PRN

## 2017-01-07 MED ORDER — POLYETHYLENE GLYCOL 3350 17 G PO PACK: 17.0000 g | PACK | Freq: Every day | ORAL | Status: DC | PRN

## 2017-01-07 MED ORDER — SENNA 8.6 MG PO TABS: 1.0000 | ORAL_TABLET | Freq: Two times a day (BID) | ORAL | 0 refills | Status: DC

## 2017-01-07 NOTE — Care Management Important Message (Signed)
Important Message  Patient Details  Name: Tammie Mcmahon MRN: [id number] Date of Birth: [birth year]   Medicare Important Message Given:  Yes Signed given to patient's niece   Katrina Stack, RN 01/07/2017, 1:50 PM

## 2017-01-07 NOTE — Progress Notes (Signed)
[facility]  SUBJECTIVE: mild sob   Vitals:   01/06/17 0800 01/06/17 1410 01/06/17 2012 01/07/17 0340  BP: (!) 123/53 (!) 118/53 (!) 147/57 (!) 138/57  Pulse: 84 79 86 94  Resp: 20 16 16 16   Temp:  97.8 F (36.6 C) 98 F (36.7 C) 98 F (36.7 C)  TempSrc:  Oral Oral Oral  SpO2: 97% 94% 97% 95%  Weight:    74.9 kg (165 lb 3.2 oz)  Height:        Intake/Output Summary (Last 24 hours) at 01/07/17 0723 Last data filed at 01/07/17 0348  Gross per 24 hour  Intake              120 ml  Output              700 ml  Net             -580 ml    LABS: Basic Metabolic Panel:  Recent Labs  01/04/17 1633  01/06/17 0456 01/07/17 0433  NA  --   < > 135 136  K  --   < > 3.8 4.0  CL  --   < > 104 106  CO2  --   < > 23 24  GLUCOSE  --   < > 127* 123*  BUN  --   < > 31* 24*  CREATININE  --   < > 2.29* 1.38*  CALCIUM  --   < > 8.1* 8.3*  MG 1.9  --   --   --   < > = values in this interval not displayed. Liver Function Tests:  Recent Labs  01/04/17 0945  AST 37  ALT 13*  ALKPHOS 47  BILITOT 0.6  PROT 6.4*  ALBUMIN 3.8   No results for input(s): LIPASE, AMYLASE in the last 72 hours. CBC:  Recent Labs  01/04/17 0945  01/06/17 0456 01/07/17 0433  WBC 11.4*  < > 12.8* 14.3*  NEUTROABS 8.5*  --   --   --   HGB 10.0*  < > 8.5* 8.5*  HCT 30.7*  < > 24.9* 24.8*  MCV 91.0  < > 88.3 88.9  PLT 292  < > 176 196  < > = values in this interval not displayed. Cardiac Enzymes:  Recent Labs  01/04/17 1633 01/04/17 2153 01/05/17 0417  TROPONINI 0.04* 0.04* 0.03*   BNP: Invalid input(s): POCBNP D-Dimer: No results for input(s): DDIMER in the last 72 hours. Hemoglobin A1C:  Recent Labs  01/04/17 1633  HGBA1C 6.6*   Fasting Lipid Panel: No results for input(s): CHOL, HDL, LDLCALC, TRIG, CHOLHDL, LDLDIRECT in the last 72 hours. Thyroid Function Tests:  Recent Labs  01/04/17 1633  TSH 5.838*   Anemia Panel: No results for  input(s): VITAMINB12, FOLATE, FERRITIN, TIBC, IRON, RETICCTPCT in the last 72 hours.   Physical Exam: Blood pressure (!) 138/57, pulse 94, temperature 98 F (36.7 C), temperature source Oral, resp. rate 16, height 5\' 5"  (1.651 m), weight 74.9 kg (165 lb 3.2 oz), SpO2 95 %.   Wt Readings from Last 1 Encounters:  01/07/17 74.9 kg (165 lb 3.2 oz)     General appearance: cooperative Resp: rhonchi bibasilar Cardio: irregularly irregular rhythm Extremities: extremities normal, atraumatic, no cyanosis or edema Neurologic: Grossly normal  TELEMETRY: Reviewed telemetry pt in afib with controlled vr. :  ASSESSMENT AND PLAN:  Active Problems:   Acute on chronic renal failure (HCC)-renal function improved. Became somewhat sob.  Agree with holding ivf for now. Encourage po fluid intake.     Syncope-likely secondary to bradycardia. Taken off of metoprolol with improved heart rate. No evidence of significant bradycardia or tachycardia at present. Will continue with amiodarone at 200 mg daily.   Hematoma-secondary to fall trauma. HGB has stabilized. Will remain off of warfarin and check inr. Will consider resumeing warfarin and following hgb and hematoma. CHADS score is 5.    Elevated troponin-demand ischemia. No evidence of acs.       Teodoro Spray, MD, [facility] 01/07/2017 7:23 AM

## 2017-01-07 NOTE — Progress Notes (Signed)
Report called to [facility]/ verbalized an understanding/ central line and tele removed / EMS called to transport

## 2017-01-07 NOTE — Clinical Social Work Placement (Signed)
   CLINICAL SOCIAL WORK PLACEMENT  NOTE  Date:  01/07/2017  Patient Details  Name: Tammie Mcmahon MRN: [id number] Date of Birth: [birth year]  Clinical Social Work is seeking post-discharge placement for this patient at the [place] level of care (*CSW will initial, date and re-position this form in  chart as items are completed):  Yes   Patient/family provided with [place] Work Department's list of facilities offering this level of care within the geographic area requested by the patient (or if unable, by the patient's family).  Yes   Patient/family informed of their freedom to choose among providers that offer the needed level of care, that participate in Medicare, Medicaid or managed care program needed by the patient, have an available bed and are willing to accept the patient.  Yes   Patient/family informed of [place]'s ownership interest in [facility] and [facility], as well as of the fact that they are under no obligation to receive care at these facilities.  PASRR submitted to EDS on 01/06/17     PASRR number received on 01/06/17     Existing PASRR number confirmed on       FL2 transmitted to all facilities in geographic area requested by pt/family on 01/06/17     FL2 transmitted to all facilities within larger geographic area on       Patient informed that his/her managed care company has contracts with or will negotiate with certain facilities, including the following:        Yes   Patient/family informed of bed offers received.  Patient chooses bed at [facility]     Physician recommends and patient chooses bed at      Patient to be transferred to [facility] on 01/07/17.  Patient to be transferred to facility by [facility]     Patient family notified on 01/07/17 of transfer.  Name of family member notified:  Adair Laundry patient's niece     PHYSICIAN       Additional Comment:     _______________________________________________ Ross Ludwig, LCSWA 01/07/2017, 2:21 PM

## 2017-01-07 NOTE — Progress Notes (Signed)
Patient became mildly SOB, faint crackles can be heard throughout the lungs. MD notified. IVF orders discontinued. Will continue to monitor.

## 2017-01-07 NOTE — Progress Notes (Signed)
CC: Right flank hematoma Subjective: Patient denies any recent changes to her right flank hematoma. Denies any drainage to the area. Pain is well-controlled.  Objective: Vital signs in last 24 hours: Temp:  [97.8 F (36.6 C)-98.2 F (36.8 C)] 98.2 F (36.8 C) (04/04 1218) Pulse Rate:  [76-94] 76 (04/04 1218) Resp:  [16-18] 18 (04/04 1218) BP: (118-147)/(50-57) 137/50 (04/04 1218) SpO2:  [94 %-97 %] 97 % (04/04 1218) Weight:  [74.9 kg (165 lb 3.2 oz)] 74.9 kg (165 lb 3.2 oz) (04/04 0340) Last BM Date: 01/05/17 (per pt )  Intake/Output from previous day: 04/03 0701 - 04/04 0700 In: 120 [P.O.:120] Out: 700 [Urine:700] Intake/Output this shift: Total I/O In: 240 [P.O.:240] Out: 100 [Urine:100]  Physical exam:  And all: No acute distress Skin: Right flank and hip with large visible and palpable hematoma without evidence of erythema. No evidence of skin breakdown or drainage.  Lab Results: CBC   Recent Labs  01/06/17 0456 01/07/17 0433  WBC 12.8* 14.3*  HGB 8.5* 8.5*  HCT 24.9* 24.8*  PLT 176 196   BMET  Recent Labs  01/06/17 0456 01/07/17 0433  NA 135 136  K 3.8 4.0  CL 104 106  CO2 23 24  GLUCOSE 127* 123*  BUN 31* 24*  CREATININE 2.29* 1.38*  CALCIUM 8.1* 8.3*   PT/INR  Recent Labs  01/05/17 0417 01/06/17 0456  LABPROT 43.1* 22.9*  INR 4.39* 1.99   ABG No results for input(s): PHART, HCO3 in the last 72 hours.  Invalid input(s): PCO2, PO2  Studies/Results: Dg Chest Port 1 View  Result Date: 01/05/2017 CLINICAL DATA:  Initial evaluation for PICC line adjustment. EXAM: PORTABLE CHEST 1 VIEW COMPARISON:  Prior radiograph from earlier the same day. FINDINGS: a.m. Right subclavian central venous catheter has been retracted, with tip now projecting over the cavoatrial junction. Catheter somewhat looped proximally. Mild cardiomegaly, stable. Mediastinal silhouette within normal limits. Aortic atherosclerosis. Lungs hypoinflated. Mild vascular congestion  without overt pulmonary edema. No pleural effusion. No focal infiltrates. No pneumothorax. No acute osseus abnormality. IMPRESSION: 1. Tip of right subclavian centra venous catheter overlying the cavoatrial junction. 2. Cardiomegaly with mild pulmonary vascular congestion without overt pulmonary edema. 3. Aortic atherosclerosis. Electronically Signed   By: Jeannine Boga M.D.   On: 01/05/2017 15:04   Dg Chest Port 1 View  Result Date: 01/05/2017 CLINICAL DATA:  Patient status post PICC line insertion. EXAM: PORTABLE CHEST 1 VIEW COMPARISON:  Chest radiograph 10/21/2016. FINDINGS: Interval insertion right-sided central venous catheter with tip coursing inferior to the diaphragm. Monitoring leads overlie the patient. Low lung volumes. Stable cardiac and mediastinal contours. Aortic vascular calcifications. Minimal heterogeneous opacities lung bases bilaterally, favored to represent atelectasis. No pleural effusion or pneumothorax. IMPRESSION: New right-sided central venous catheter tip courses inferior to the diaphragm. Subsequent imaging demonstrates interval retraction of the catheter tip. Aortic atherosclerosis. Electronically Signed   By: Lovey Newcomer M.D.   On: 01/05/2017 15:03    Anti-infectives: Anti-infectives    None      Assessment/Plan:  83 year old female with a large right flank hematoma secondary to a fall and blood thinners. Stable H&H. No evidence of skin breakdown. Discussed at length with the patient and her daughter the indications for operative intervention. They voiced understanding. Given there is no signs of skin breakdown or infection, there are no current plans for urgent operative intervention.  Kavina Cantave T. Adonis Huguenin, MD, [facility]  Day [facility] [phone] Night [facility] [phone]  01/07/2017      

## 2017-01-07 NOTE — Discharge Summary (Signed)
[facility] NAME: Tammie Mcmahon    MR#:  [id number]  DATE OF BIRTH:  1928/06/25  DATE OF ADMISSION:  01/04/2017   ADMITTING PHYSICIAN: Theodoro Grist, MD  DATE OF DISCHARGE: 01/07/2017  PRIMARY CARE PHYSICIAN: Kirk Ruths., MD   ADMISSION DIAGNOSIS:  Syncope and collapse [R55] Cerebral infarction (HCC) [I63.9] CVA (cerebral infarction) [I63.9] AKI (acute kidney injury) ([place]) [N17.9] DISCHARGE DIAGNOSIS:  Active Problems:   Acute on chronic renal failure (HCC)   Syncope   Lower abdominal pain   CKD (chronic kidney disease), stage III   Elevated troponin   Hypokalemia   Leukocytosis   Dysuria   Constipation  SECONDARY DIAGNOSIS:   Past Medical History:  Diagnosis Date  . (HFpEF) heart failure with preserved ejection fraction ([place])   . Atrial fibrillation and flutter ([place])   . Breast cancer ([place])   . CKD (chronic kidney disease), stage III   . Diabetes mellitus without complication ([place])   . Hyperlipidemia   . Hypertension   . Low back pain   . Menopausal and postmenopausal disorder   . PA (pernicious anemia)   . Thyroid disease   . Valvular heart disease    HOSPITAL COURSE:  83 year old very pleasant female past medical history significant for diastolic heart failure, PAF on coumadin and diabetes who presents with weakness after fall 2 days ago and found to have large right gluteal hematoma  1. Acute blood loss anemia due to extensive right gluteal hematoma - status post 2 units PRBC. Hemoglobin stable this morning.  - She remains hemodynamically stable - Her Coumadin is stopped , decision to resume will be up to her primary care physician and cardiology as outpatient  2. Right gluteal hematoma: No new surgical intervention per surgery  3. Syncope, from bradycardia METOPROLOL stopped  4. Elevated troponin due to be demand ischemia and not ACS  5. Acute kidney injury in the setting of hematoma  and fall use of torsemide Creatinine back to baseline Normal renal ultrasound  6. History of PAF on Coumadin Continue AMIODARONE for rate control -Coumadin has been stopped due to large hematoma.   7. Diabetes with hemoglobin A1c of 6.6  8. Hypothyroidism: Continue Synthroid   9. Hyperlipidemia: Continue pravastatin   10. Depression: Continue Cymbalta   11. Chronic diastolic heart failure with preserved ejection fraction: Diuretics on hold due to acute kidney injury   DISCHARGE CONDITIONS:  stable CONSULTS OBTAINED:  Treatment Team:  Anthonette Legato, MD Olean Ree, MD Teodoro Spray, MD DRUG ALLERGIES:  No Known Allergies DISCHARGE MEDICATIONS:   Allergies as of 01/07/2017   No Known Allergies     Medication List    STOP taking these medications   metoprolol succinate 50 MG 24 hr tablet Commonly known as:  TOPROL-XL   warfarin 1 MG tablet Commonly known as:  COUMADIN   warfarin 2 MG tablet Commonly known as:  COUMADIN     TAKE these medications   amiodarone 200 MG tablet Commonly known as:  PACERONE Take 400 mg by mouth daily.   azelastine 0.1 % nasal spray Commonly known as:  ASTELIN Place 1 spray into both nostrils daily as needed for allergies.   benzonatate 200 MG capsule Commonly known as:  TESSALON Take 1 capsule by mouth 3 (three) times daily as needed for cough.   cholecalciferol 1000 units tablet Commonly known as:  VITAMIN D Take 1,000 Units by mouth daily.   diclofenac sodium  1 % Gel Commonly known as:  VOLTAREN Apply topically 4 (four) times daily as needed.   DULoxetine 60 MG capsule Commonly known as:  CYMBALTA Take 60 mg by mouth daily.   levothyroxine 75 MCG tablet Commonly known as:  SYNTHROID, LEVOTHROID Take 1 tablet (75 mcg total) by mouth daily before breakfast.   omeprazole 20 MG capsule Commonly known as:  PRILOSEC Take 20 mg by mouth daily.   polyethylene glycol packet Commonly known as:  MIRALAX /  GLYCOLAX Take 17 g by mouth daily as needed for moderate constipation.   potassium chloride 10 MEQ CR capsule Commonly known as:  MICRO-K Take 10 mEq by mouth daily.   pravastatin 40 MG tablet Commonly known as:  PRAVACHOL Take 40 mg by mouth at bedtime.   senna 8.6 MG Tabs tablet Commonly known as:  SENOKOT Take 1 tablet (8.6 mg total) by mouth 2 (two) times daily.   torsemide 20 MG tablet Commonly known as:  DEMADEX Take 40 mg by mouth daily.      DISCHARGE INSTRUCTIONS:   DIET:  Regular diet DISCHARGE CONDITION:  Good ACTIVITY:  Activity as tolerated OXYGEN:  Home Oxygen: No.  Oxygen Delivery: room air DISCHARGE LOCATION:  nursing home   If you experience worsening of your admission symptoms, develop shortness of breath, life threatening emergency, suicidal or homicidal thoughts you must seek medical attention immediately by calling 911 or calling your MD immediately  if symptoms less severe.  You Must read complete instructions/literature along with all the possible adverse reactions/side effects for all the Medicines you take and that have been prescribed to you. Take any new Medicines after you have completely understood and accpet all the possible adverse reactions/side effects.   Please note  You were cared for by a hospitalist during your hospital stay. If you have any questions about your discharge medications or the care you received while you were in the hospital after you are discharged, you can call the unit and asked to speak with the hospitalist on call if the hospitalist that took care of you is not available. Once you are discharged, your primary care physician will handle any further medical issues. Please note that NO REFILLS for any discharge medications will be authorized once you are discharged, as it is imperative that you return to your primary care physician (or establish a relationship with a primary care physician if you do not have one) for your  aftercare needs so that they can reassess your need for medications and monitor your lab values.    On the day of Discharge:  VITAL SIGNS:  Blood pressure (!) 137/50, pulse 76, temperature 98.2 F (36.8 C), resp. rate 18, height 5\' 5"  (1.651 m), weight 74.9 kg (165 lb 3.2 oz), SpO2 97 %. PHYSICAL EXAMINATION:  GENERAL:  83 y.o.-year-old patient lying in the bed with no acute distress.  EYES: Pupils equal, round, reactive to light and accommodation. No scleral icterus. Extraocular muscles intact.  HEENT: Head atraumatic, normocephalic. Oropharynx and nasopharynx clear.  NECK:  Supple, no jugular venous distention. No thyroid enlargement, no tenderness.  LUNGS: Normal breath sounds bilaterally, no wheezing, rales,rhonchi or crepitation. No use of accessory muscles of respiration.  CARDIOVASCULAR: S1, S2 normal. No murmurs, rubs, or gallops.  ABDOMEN: Soft, non-tender, non-distended. Bowel sounds present. No organomegaly or mass.  EXTREMITIES: No pedal edema, cyanosis, or clubbing.  NEUROLOGIC: Cranial nerves II through XII are intact. Muscle strength 5/5 in all extremities. Sensation intact. Gait not checked.  PSYCHIATRIC: The patient is alert and oriented x 3.  SKIN: No obvious rash, lesion, or ulcer.  DATA REVIEW:   CBC  Recent Labs Lab 01/07/17 0433  WBC 14.3*  HGB 8.5*  HCT 24.8*  PLT 196    Chemistries   Recent Labs Lab 01/04/17 0945 01/04/17 1633  01/07/17 0433  NA 137  --   < > 136  K 3.4*  --   < > 4.0  CL 102  --   < > 106  CO2 22  --   < > 24  GLUCOSE 238*  --   < > 123*  BUN 21*  --   < > 24*  CREATININE 2.00*  --   < > 1.38*  CALCIUM 8.7*  --   < > 8.3*  MG  --  1.9  --   --   AST 37  --   --   --   ALT 13*  --   --   --   ALKPHOS 47  --   --   --   BILITOT 0.6  --   --   --   < > = values in this interval not displayed.   Follow-up Information    Kirk Ruths., MD. Schedule an appointment as soon as possible for a visit in 1 week(s).    Specialty:  Internal Medicine Contact information: [place] [address] [phone]        Teodoro Spray, MD. Schedule an appointment as soon as possible for a visit in 2 week(s).   Specialty:  Cardiology Contact information: [place] [address] [phone]           Management plans discussed with the patient, family and they are in agreement.  CODE STATUS: Full Code   TOTAL TIME TAKING CARE OF THIS PATIENT: 45 minutes.    Max Sane M.D on 01/07/2017 at 1:03 PM  Between 7am to 6pm - Pager - [phone]  After 6pm go to [email or web address] - Proofreader  [facility]  Office  [phone]  CC: Primary care physician; Kirk Ruths., MD   Note: This dictation was prepared with Dragon dictation along with smaller phrase technology. Any transcriptional errors that result from this process are unintentional.

## 2017-01-07 NOTE — Discharge Instructions (Signed)
Hematoma A hematoma is a collection of blood. The collection of blood can turn into a hard, painful lump under the skin. Your skin may turn blue or yellow if the hematoma is close to the surface of the skin. Most hematomas get better in a few days to weeks. Some hematomas are serious and need medical care. Hematomas can be very small or very big. Follow these instructions at home:  Apply ice to the injured area: ? Put ice in a plastic bag. ? Place a towel between your skin and the bag. ? Leave the ice on for 20 minutes, 2-3 times a day for the first 1 to 2 days.  After the first 2 days, switch to using warm packs on the injured area.  Raise (elevate) the injured area to lessen pain and puffiness (swelling). You may also wrap the area with an elastic bandage. Make sure the bandage is not wrapped too tight.  If you have a painful hematoma on your leg or foot, you may use crutches for a couple days.  Only take medicines as told by your doctor. Get help right away if:  Your pain gets worse.  Your pain is not controlled with medicine.  You have a fever.  Your puffiness gets worse.  Your skin turns more blue or yellow.  Your skin over the hematoma breaks or starts bleeding.  Your hematoma is in your chest or belly (abdomen) and you are short of breath, feel weak, or have a change in consciousness.  Your hematoma is on your scalp and you have a headache that gets worse or a change in alertness or consciousness. This information is not intended to replace advice given to you by your health care provider. Make sure you discuss any questions you have with your health care provider. Document Released: 10/30/2004 Document Revised: 02/28/2016 Document Reviewed: 03/02/2013 Elsevier Interactive Patient Education  2017 [employer]  

## 2017-01-13 ENCOUNTER — Non-Acute Institutional Stay (SKILLED_NURSING_FACILITY): Payer: Medicare Other | Admitting: Gerontology

## 2017-01-13 DIAGNOSIS — R55 Syncope and collapse: Secondary | ICD-10-CM

## 2017-01-13 DIAGNOSIS — R410 Disorientation, unspecified: Secondary | ICD-10-CM

## 2017-01-21 NOTE — Progress Notes (Signed)
Location:      Place of Service:  SNF (31) Provider:  Toni Arthurs, NP-C  Kirk Ruths., MD  Patient Care Team: Kirk Ruths, MD as PCP - General (Internal Medicine)  Extended Emergency Contact Information Primary Emergency Contact: Danella Deis Address: [address]          [place], [place] [address] Johnnette Litter of [place] Phone: [phone] Relation: Sister Secondary Emergency Contact: Aniceto Boss [place] of [place] Phone: [phone] Mobile Phone: [phone] Relation: Niece  Code Status:  FULL Goals of care: Advanced Directive information Advanced Directives 01/04/2017  Does Patient Have a Medical Advance Directive? No  Would patient like information on creating a medical advance directive? No - Patient declined     Chief Complaint  Patient presents with  . Follow-up    HPI:  Pt is a 83 y.o. female seen today for follow up. Pt is s/p hospitalization for syncope/ collapse, CKD. Today, pt reports she is feeling well. She has been working with PT and OT as well as SLP for generalized weakness, deconditioning and cognitive deficits. She was combative with staff the first night she was here, likely from acute delirium. Pt has been less combative since then but confused and wandering into other pt's rooms at night. Pt attempts to leave the facility at times. SW spoke with nephew about the need for pt to be moved to a ALF asap to expedite acclimation/ decrease delirium. Pt is pleasant and cooperative during the day. Pt reports appetite is good. Pt is voiding regularly and having regular BM's. VSS. No other complaints.        Past Medical History:  Diagnosis Date  . (HFpEF) heart failure with preserved ejection fraction ([place])   . Atrial fibrillation and flutter ([place])   . Breast cancer ([facility])   . CKD (chronic kidney disease), stage III   . Diabetes mellitus without complication ([facility])   . Hyperlipidemia   . Hypertension   . Low back  pain   . Menopausal and postmenopausal disorder   . PA (pernicious anemia)   . Thyroid disease   . Valvular heart disease    Past Surgical History:  Procedure Laterality Date  . BREAST SURGERY    . GALLBLADDER SURGERY    . MASTECTOMY Left   . THYROIDECTOMY      No Known Allergies  Allergies as of 01/13/2017   No Known Allergies     Medication List       Accurate as of 01/13/17 11:59 PM. Always use your most recent med list.          amiodarone 200 MG tablet Commonly known as:  PACERONE Take 400 mg by mouth daily.   azelastine 0.1 % nasal spray Commonly known as:  ASTELIN Place 1 spray into both nostrils daily as needed for allergies.   benzonatate 200 MG capsule Commonly known as:  TESSALON Take 1 capsule by mouth 3 (three) times daily as needed for cough.   cholecalciferol 1000 units tablet Commonly known as:  VITAMIN D Take 1,000 Units by mouth daily.   diclofenac sodium 1 % Gel Commonly known as:  VOLTAREN Apply topically 4 (four) times daily as needed.   DULoxetine 60 MG capsule Commonly known as:  CYMBALTA Take 60 mg by mouth daily.   levothyroxine 75 MCG tablet Commonly known as:  SYNTHROID, LEVOTHROID Take 1 tablet (75 mcg total) by mouth daily before breakfast.   omeprazole 20 MG capsule Commonly known as:  PRILOSEC  Take 20 mg by mouth daily.   polyethylene glycol packet Commonly known as:  MIRALAX / GLYCOLAX Take 17 g by mouth daily as needed for moderate constipation.   potassium chloride 10 MEQ CR capsule Commonly known as:  MICRO-K Take 10 mEq by mouth daily.   pravastatin 40 MG tablet Commonly known as:  PRAVACHOL Take 40 mg by mouth at bedtime.   senna 8.6 MG Tabs tablet Commonly known as:  SENOKOT Take 1 tablet (8.6 mg total) by mouth 2 (two) times daily.   torsemide 20 MG tablet Commonly known as:  DEMADEX Take 40 mg by mouth daily.       Review of Systems  Unable to perform ROS: Dementia  Constitutional: Negative for  activity change, appetite change, chills, diaphoresis and fever.  HENT: Negative for congestion, sneezing, sore throat, trouble swallowing and voice change.   Respiratory: Negative for apnea, cough, choking, chest tightness, shortness of breath and wheezing.   Cardiovascular: Negative for chest pain, palpitations and leg swelling.  Gastrointestinal: Negative for abdominal distention, abdominal pain, constipation, diarrhea and nausea.  Genitourinary: Negative for difficulty urinating, dysuria, frequency and urgency.  Musculoskeletal: Negative for back pain, gait problem and myalgias. Arthralgias: typical arthritis.  Skin: Negative for color change, pallor, rash and wound.  Neurological: Negative for dizziness, tremors, syncope, speech difficulty, weakness, numbness and headaches.  Psychiatric/Behavioral: Positive for confusion. Negative for agitation and behavioral problems.  All other systems reviewed and are negative.    There is no immunization history on file for this patient. Pertinent  Health Maintenance Due  Topic Date Due  . DEXA SCAN  09/09/1995  . PNA vac Low Risk Adult (1 of 2 - PCV13) 09/09/1995  . INFLUENZA VACCINE  05/06/2017   No flowsheet data found. Functional Status Survey:    Vitals:   01/13/17 0535  BP: (!) 160/77  Pulse: 99  Resp: 18  Temp: 98 F (36.7 C)  SpO2: 98%  Weight: 168 lb 9.6 oz (76.5 kg)   Body mass index is 28.06 kg/m. Physical Exam  Constitutional: She is oriented to person, place, and time. Vital signs are normal. She appears well-developed and well-nourished. She is active and cooperative. She does not appear ill. No distress.  HENT:  Head: Normocephalic and atraumatic.  Mouth/Throat: Uvula is midline, oropharynx is clear and moist and mucous membranes are normal. Mucous membranes are not pale, not dry and not cyanotic.  Eyes: Conjunctivae, EOM and lids are normal. Pupils are equal, round, and reactive to light.  Neck: Trachea normal,  normal range of motion and full passive range of motion without pain. Neck supple. No JVD present. No tracheal deviation, no edema and no erythema present. No thyromegaly present.  Cardiovascular: Normal rate, regular rhythm, normal heart sounds, intact distal pulses and normal pulses.  Exam reveals no gallop, no distant heart sounds and no friction rub.   No murmur heard. Pulmonary/Chest: Effort normal and breath sounds normal. No accessory muscle usage. No respiratory distress. She has no wheezes. She has no rales. She exhibits no tenderness.  Abdominal: Normal appearance and bowel sounds are normal. She exhibits no distension and no ascites. There is no tenderness.  Musculoskeletal: Normal range of motion. She exhibits no edema or tenderness.  Expected osteoarthritis, stiffness  Neurological: She is alert and oriented to person, place, and time. She has normal strength.  Skin: Skin is warm, dry and intact. No rash noted. She is not diaphoretic. No cyanosis or erythema. No pallor. Nails show no  clubbing.  Psychiatric: She has a normal mood and affect. Her speech is normal. Thought content normal. She is combative (at times). Cognition and memory are impaired. She expresses impulsivity and inappropriate judgment. She exhibits abnormal recent memory and abnormal remote memory.  Nursing note and vitals reviewed.   Labs reviewed:  Recent Labs  10/16/16 1110  01/04/17 1633 01/05/17 0417 01/06/17 0456 01/07/17 0433  NA 135  < >  --  136 135 136  K 3.9  < >  --  4.1 3.8 4.0  CL 98*  < >  --  103 104 106  CO2 27  < >  --  _0 GLUCOSE 145*  < >  --  143* 127* 123*  BUN 15  < >  --  27* 31* 24*  CREATININE 1.04*  < >  --  2.56* 2.29* 1.38*  CALCIUM 8.7*  < >  --  8.1* 8.1* 8.3*  MG 2.2  --  1.9  --   --   --   < > = values in this interval not displayed.  Recent Labs  10/10/16 1614 10/16/16 1110 01/04/17 0945  AST 110* 40 37  ALT 27 29 13*  ALKPHOS 65 74 47  BILITOT 1.6* 0.5  0.6  PROT 5.8* 6.9 6.4*  ALBUMIN 3.5 3.9 3.8    Recent Labs  10/10/16 1614 10/13/16 1120  01/04/17 0945 01/05/17 0417 01/05/17 2313 01/06/17 0456 01/07/17 0433  WBC 12.1* 11.7*  < > 11.4* 11.4*  --  12.8* 14.3*  NEUTROABS 10.2* 8.0*  --  8.5*  --   --   --   --   HGB 11.7* 12.4  < > 10.0* 6.6* 8.4* 8.5* 8.5*  HCT 34.4* 38.4  < > 30.7* 20.2*  --  24.9* 24.8*  MCV 87.5 89.2  < > 91.0 92.0  --  88.3 88.9  PLT 334 361  < > 292 219  --  176 196  < > = values in this interval not displayed. Lab Results  Component Value Date   TSH 5.838 (H) 01/04/2017   Lab Results  Component Value Date   HGBA1C 6.6 (H) 01/04/2017   No results found for: CHOL, HDL, LDLCALC, LDLDIRECT, TRIG, CHOLHDL  Significant Diagnostic Results in last 30 days:  Ct Abdomen Pelvis Wo Contrast  Result Date: 01/04/2017 CLINICAL DATA:  Fall this morning and yesterday. Right-sided abdominal pain. Pelvic pain. EXAM: CT ABDOMEN AND PELVIS WITHOUT CONTRAST TECHNIQUE: Multidetector CT imaging of the abdomen and pelvis was performed following the standard protocol without IV contrast. COMPARISON:  10/16/2016 CT abdomen/ pelvis. Pelvic CT from earlier today. FINDINGS: Lower chest: No significant pulmonary nodules or acute consolidative airspace disease. Oral contrast is seen in the lower thoracic esophagus, suggesting dysmotility and/ or gastroesophageal reflux. Hepatobiliary: Normal liver with no liver mass. Cholecystectomy. Bile ducts are stable and within expected post cholecystectomy limits. Common bile duct diameter 7 mm. No radiopaque choledocholithiasis . Pancreas: Diffuse fatty replacement of the pancreas without pancreatic mass or duct dilation, unchanged. Spleen: Normal size. No mass. Adrenals/Urinary Tract: Normal adrenals. Symmetric mild to moderate renal atrophy, unchanged. No hydronephrosis. No renal stones. Two exophytic subcentimeter hypodense renal cortical lesions in the interpolar left kidney, too small to  characterize, not appreciably changed, suggesting benign renal cysts, for which no further follow-up is required. No additional contour deforming renal lesions. Normal bladder. Stomach/Bowel: Small hiatal hernia. Otherwise grossly normal stomach. Normal caliber small bowel with no small bowel wall  thickening. Normal appendix. Moderate sigmoid diverticulosis, with no definite large bowel wall thickening or pericolonic fat stranding. Moderate stool in the left colon and rectum. Oral contrast progresses to the distal colon. Vascular/Lymphatic: Atherosclerotic nonaneurysmal abdominal aorta. No pathologically enlarged lymph nodes in the abdomen or pelvis. Reproductive: Grossly normal uterus.  No adnexal mass. Other: No pneumoperitoneum, ascites or focal fluid collection. Musculoskeletal: No aggressive appearing focal osseous lesions. Marked degenerative disc disease at T12-L1. No fractures. There is a large partially visualized contusion with hematoma in the subcutaneous lateral right gluteal region, with the subcutaneous hematoma measuring at least 7.3 x 11.7 cm (series 7/image 19). IMPRESSION: 1. Large partially visualized contusion with hematoma in the subcutaneous lateral right gluteal region. No fractures. 2. Otherwise no acute abnormality. No evidence of bowel obstruction or acute bowel inflammation. Moderate sigmoid diverticulosis, with no evidence of acute diverticulitis. Moderate distal colonic and rectal stool. 3. Aortic atherosclerosis. 4. Small hiatal hernia. Electronically Signed   By: Ilona Sorrel M.D.   On: 01/04/2017 14:43   Ct Head Wo Contrast  Result Date: 01/04/2017 CLINICAL DATA:  Headache and neck pain following a fall today. EXAM: CT HEAD WITHOUT CONTRAST CT CERVICAL SPINE WITHOUT CONTRAST TECHNIQUE: Multidetector CT imaging of the head and cervical spine was performed following the standard protocol without intravenous contrast. Multiplanar CT image reconstructions of the cervical spine were  also generated. COMPARISON:  Head CT dated 01/03/2017, brain MR dated 09/10/2016 and cervical spine CT dated 04/05/2012. FINDINGS: CT HEAD FINDINGS Brain: Diffusely enlarged ventricles and subarachnoid spaces. Patchy white matter low density in both cerebral hemispheres. No intracranial hemorrhage, mass lesion or CT evidence of acute infarction. Vascular: No hyperdense vessel or unexpected calcification. Skull: Normal. Negative for fracture or focal lesion. Sinuses/Orbits: No acute finding. Other: None. CT CERVICAL SPINE FINDINGS Alignment: Stable minimal anterolisthesis at the C2-3 and C6-7 levels. Skull base and vertebrae: No acute fracture. No primary bone lesion or focal pathologic process. Soft tissues and spinal canal: No prevertebral fluid or swelling. No visible canal hematoma. Disc levels: Stable moderate-to-marked disc space narrowing and mild anterior and posterior spur formation at the C3-4 and C4-5 levels. Moderate disc space narrowing at the C5-6 level with mild posterior spur formation without significant change. Mild anterior and posterior spur formation at the C6-7 level. Mild facet degenerative changes at multiple levels. Upper chest: Clear lung apices. Other: None. IMPRESSION: 1. No skull fracture or intracranial hemorrhage. 2. No cervical spine fracture or traumatic subluxation. 3. Stable diffuse cerebral and cerebellar atrophy and chronic small vessel white matter ischemic changes. 4. Stable cervical spine degenerative changes. Electronically Signed   By: Claudie Revering M.D.   On: 01/04/2017 10:15   Ct Head Wo Contrast  Result Date: 01/03/2017 CLINICAL DATA:  Status post fall. Hit head on curb. Initial encounter. EXAM: CT HEAD WITHOUT CONTRAST TECHNIQUE: Contiguous axial images were obtained from the base of the skull through the vertex without intravenous contrast. COMPARISON:  CT of the head performed 10/06/2016 FINDINGS: Brain: No evidence of acute infarction, hemorrhage, hydrocephalus,  extra-axial collection or mass lesion/mass effect. Prominence of the ventricles and sulci reflects mild to moderate cortical volume loss. Cerebellar atrophy is noted. Scattered periventricular white matter change likely reflects small vessel ischemic microangiopathy. The brainstem and fourth ventricle are within normal limits. The basal ganglia are unremarkable in appearance. The cerebral hemispheres demonstrate grossly normal gray-white differentiation. No mass effect or midline shift is seen. Vascular: No hyperdense vessel or unexpected calcification. Skull: There is no evidence  of fracture; visualized osseous structures are unremarkable in appearance. Sinuses/Orbits: The visualized portions of the orbits are within normal limits. The paranasal sinuses and mastoid air cells are well-aerated. Other: No significant soft tissue abnormalities are seen. IMPRESSION: 1. No evidence of traumatic intracranial injury or fracture. 2. Mild to moderate cortical volume loss and scattered small vessel ischemic microangiopathy. Next Electronically Signed   By: Garald Balding M.D.   On: 01/03/2017 18:37   Ct Cervical Spine Wo Contrast  Result Date: 01/04/2017 CLINICAL DATA:  Headache and neck pain following a fall today. EXAM: CT HEAD WITHOUT CONTRAST CT CERVICAL SPINE WITHOUT CONTRAST TECHNIQUE: Multidetector CT imaging of the head and cervical spine was performed following the standard protocol without intravenous contrast. Multiplanar CT image reconstructions of the cervical spine were also generated. COMPARISON:  Head CT dated 01/03/2017, brain MR dated 09/10/2016 and cervical spine CT dated 04/05/2012. FINDINGS: CT HEAD FINDINGS Brain: Diffusely enlarged ventricles and subarachnoid spaces. Patchy white matter low density in both cerebral hemispheres. No intracranial hemorrhage, mass lesion or CT evidence of acute infarction. Vascular: No hyperdense vessel or unexpected calcification. Skull: Normal. Negative for fracture or  focal lesion. Sinuses/Orbits: No acute finding. Other: None. CT CERVICAL SPINE FINDINGS Alignment: Stable minimal anterolisthesis at the C2-3 and C6-7 levels. Skull base and vertebrae: No acute fracture. No primary bone lesion or focal pathologic process. Soft tissues and spinal canal: No prevertebral fluid or swelling. No visible canal hematoma. Disc levels: Stable moderate-to-marked disc space narrowing and mild anterior and posterior spur formation at the C3-4 and C4-5 levels. Moderate disc space narrowing at the C5-6 level with mild posterior spur formation without significant change. Mild anterior and posterior spur formation at the C6-7 level. Mild facet degenerative changes at multiple levels. Upper chest: Clear lung apices. Other: None. IMPRESSION: 1. No skull fracture or intracranial hemorrhage. 2. No cervical spine fracture or traumatic subluxation. 3. Stable diffuse cerebral and cerebellar atrophy and chronic small vessel white matter ischemic changes. 4. Stable cervical spine degenerative changes. Electronically Signed   By: Claudie Revering M.D.   On: 01/04/2017 10:15   Ct Pelvis Wo Contrast  Result Date: 01/04/2017 CLINICAL DATA:  Right hip pain following a fall. Nondisplaced subcapital femoral neck fracture on the left on recent hip radiographs. No fracture seen on the right on the radiographs. EXAM: CT PELVIS WITHOUT CONTRAST TECHNIQUE: Multidetector CT imaging of the pelvis was performed following the standard protocol without intravenous contrast. COMPARISON:  Pelvis and bilateral hip radiographs obtained earlier today. FINDINGS: Urinary Tract: Unremarkable urinary bladder and distal ureters. Multiple bilateral pelvic phleboliths. Bowel: Stool throughout the visualized colon, most pronounced in the rectum. Sigmoid colon diverticula. Unremarkable small bowel loops. Normal appearing appendix. Vascular/Lymphatic: Atheromatous arterial calcifications. No enlarged lymph nodes. Reproductive:  Small,  normal appearing uterus and ovaries. Other:  Very small umbilical hernia containing fat. Musculoskeletal: Lower lumbar spine degenerative changes. Sacral meningoceles. Minimal bilateral femoral head and neck junction spur formation. The spur formation on the left mimicked a subcapital femoral neck fracture on the recent radiographs. No fracture or dislocation is seen on either side. IMPRESSION: 1. No fracture or dislocation involving either hip. 2. Minimal bilateral femoral head and neck junction degenerative spur formation. The spur formation on the left mimicked a subcapital femoral neck fracture on the recent radiographs. 3. Sigmoid diverticulosis. Electronically Signed   By: Claudie Revering M.D.   On: 01/04/2017 11:09   US Carotid Bilateral  Result Date: 01/04/2017 CLINICAL DATA:  Stroke. Hypertension, visual  disturbance right eye, diabetes, previous tobacco abuse. EXAM: BILATERAL CAROTID DUPLEX ULTRASOUND TECHNIQUE: Pearline Cables scale imaging, color Doppler and duplex ultrasound was performed of bilateral carotid and vertebral arteries in the neck. COMPARISON:  04/07/2005 TECHNIQUE: Quantification of carotid stenosis is based on velocity parameters that correlate the residual internal carotid diameter with NASCET-based stenosis levels, using the diameter of the distal internal carotid lumen as the denominator for stenosis measurement. The following velocity measurements were obtained: PEAK SYSTOLIC/END DIASTOLIC RIGHT ICA:                     149/17cm/sec CCA:                     41/9FX/TKW SYSTOLIC ICA/CCA RATIO:  4.09 DIASTOLIC ICA/CCA RATIO: 7.35 ECA:                     69cm/sec LEFT ICA:                     120/15cm/sec CCA:                     32/99ME/QAS SYSTOLIC ICA/CCA RATIO:  3.41 DIASTOLIC ICA/CCA RATIO: 9.62 ECA:                     94cm/sec FINDINGS: RIGHT CAROTID ARTERY: No significant plaque or stenosis. Normal waveforms and color Doppler signal. Distal ICA mildly tortuous. RIGHT VERTEBRAL ARTERY:   Normal flow direction and waveform. LEFT CAROTID ARTERY: Mild eccentric plaque in the distal common carotid artery and bulb without significant stenosis. Normal waveforms and color Doppler signal. LEFT VERTEBRAL ARTERY: Normal flow direction and waveform. IMPRESSION: 1. Mild left carotid bifurcation plaque resulting in less than 50% diameter stenosis. 2.  Antegrade bilateral vertebral arterial flow. Electronically Signed   By: Lucrezia Europe M.D.   On: 01/04/2017 15:02   Dg Chest Port 1 View  Result Date: 01/05/2017 CLINICAL DATA:  Initial evaluation for PICC line adjustment. EXAM: PORTABLE CHEST 1 VIEW COMPARISON:  Prior radiograph from earlier the same day. FINDINGS: a.m. Right subclavian central venous catheter has been retracted, with tip now projecting over the cavoatrial junction. Catheter somewhat looped proximally. Mild cardiomegaly, stable. Mediastinal silhouette within normal limits. Aortic atherosclerosis. Lungs hypoinflated. Mild vascular congestion without overt pulmonary edema. No pleural effusion. No focal infiltrates. No pneumothorax. No acute osseus abnormality. IMPRESSION: 1. Tip of right subclavian centra venous catheter overlying the cavoatrial junction. 2. Cardiomegaly with mild pulmonary vascular congestion without overt pulmonary edema. 3. Aortic atherosclerosis. Electronically Signed   By: Jeannine Boga M.D.   On: 01/05/2017 15:04   Dg Chest Port 1 View  Result Date: 01/05/2017 CLINICAL DATA:  Patient status post PICC line insertion. EXAM: PORTABLE CHEST 1 VIEW COMPARISON:  Chest radiograph 10/21/2016. FINDINGS: Interval insertion right-sided central venous catheter with tip coursing inferior to the diaphragm. Monitoring leads overlie the patient. Low lung volumes. Stable cardiac and mediastinal contours. Aortic vascular calcifications. Minimal heterogeneous opacities lung bases bilaterally, favored to represent atelectasis. No pleural effusion or pneumothorax. IMPRESSION: New  right-sided central venous catheter tip courses inferior to the diaphragm. Subsequent imaging demonstrates interval retraction of the catheter tip. Aortic atherosclerosis. Electronically Signed   By: Lovey Newcomer M.D.   On: 01/05/2017 15:03   Dg Hip Unilat W Or Wo Pelvis 2-3 Views Left  Result Date: 01/04/2017 CLINICAL DATA:  Syncopal episode this morning while walking to the bathroom with family when patient  went limp. Patient here yesterday for same. EXAM: DG HIP (WITH OR WITHOUT PELVIS) 2-3V LEFT COMPARISON:  None. FINDINGS: Nondisplaced subcapital left femoral neck fracture. Generalized osteopenia. Joint spaces are maintained. Lower lumbar spine spondylosis partially visualize. No aggressive lytic or sclerotic osseous lesion. Peripheral vascular atherosclerotic disease. IMPRESSION: Nondisplaced subcapital left femoral neck fracture. Electronically Signed   By: Kathreen Devoid   On: 01/04/2017 10:11   Dg Hip Unilat W Or Wo Pelvis 2-3 Views Right  Result Date: 01/04/2017 CLINICAL DATA:  Syncopal episode walking to the bathroom. Bilateral hip pain. Limping. EXAM: DG HIP (WITH OR WITHOUT PELVIS) 2-3V RIGHT COMPARISON:  None. FINDINGS: No acute fracture or dislocation. Generalized osteopenia. Joint spaces are maintained. Lower lumbar spine spondylosis partially visualize. No aggressive lytic or sclerotic osseous lesion. Peripheral vascular atherosclerotic disease. IMPRESSION: 1.  No acute osseous injury of the right hip. 2. Given the patient's age and osteopenia, IF there is persistent clinical concern for an occult hip fracture, THEN a CT or MRI of the hip is recommended for increased sensitivity. Electronically Signed   By: Kathreen Devoid   On: 01/04/2017 10:09   Dg Hip Unilat With Pelvis 2-3 Views Right  Result Date: 01/03/2017 CLINICAL DATA:  Acute onset of right hip pain after tripping over curb and falling. Initial encounter. EXAM: DG HIP (WITH OR WITHOUT PELVIS) 2-3V RIGHT COMPARISON:  None. FINDINGS:  There is no evidence of fracture or dislocation. Both femoral heads are seated normally within their respective acetabula. The proximal right femur appears intact. No significant degenerative change is appreciated. The sacroiliac joints are unremarkable in appearance. The visualized bowel gas pattern is grossly unremarkable in appearance. Scattered vascular calcifications are seen. IMPRESSION: 1. No evidence of fracture or dislocation. 2. Scattered vascular calcifications seen. Electronically Signed   By: Garald Balding M.D.   On: 01/03/2017 18:48    Assessment/Plan 1. Syncope, unspecified syncope type  Stable  Continue working with PT/OT  Safety precautions   2. Confusion  ST eval and treat 5 x/ week x 4 weeks to address cognitive deficit    Family/ staff Communication:   Total Time:  Documentation:  Face to Face:  Family/Phone:   Labs/tests ordered:  Cbc, met c next week  Medication list reviewed and assessed for continued appropriateness. Monthly medication orders reviewed and signed.  Vikki Ports, NP-C Geriatrics [facility] [phone], [place] [address] Cell Phone (Mon-Fri 8am-5pm):  [phone] On Call:  [phone] & follow prompts after 5pm & weekends Office Phone:  [phone] Office Fax:  [phone]

## 2017-01-22 ENCOUNTER — Other Ambulatory Visit
Admission: RE | Admit: 2017-01-22 | Discharge: 2017-01-22 | Disposition: A | Discharge status: Home / Self Care | Payer: Medicare Other | Source: Ambulatory Visit | Attending: Gerontology | Admitting: Gerontology

## 2017-01-22 DIAGNOSIS — D62 Acute posthemorrhagic anemia: Secondary | ICD-10-CM | POA: Diagnosis present

## 2017-01-22 LAB — COMPREHENSIVE METABOLIC PANEL
ALBUMIN: 3.3 g/dL — AB (ref 3.5–5.0)
ALK PHOS: 65 U/L (ref 38–126)
ALT: 14 U/L (ref 14–54)
ANION GAP: 8 (ref 5–15)
AST: 24 U/L (ref 15–41)
BILIRUBIN TOTAL: 0.8 mg/dL (ref 0.3–1.2)
BUN: 20 mg/dL (ref 6–20)
CO2: 29 mmol/L (ref 22–32)
Calcium: 8.3 mg/dL — ABNORMAL LOW (ref 8.9–10.3)
Chloride: 98 mmol/L — ABNORMAL LOW (ref 101–111)
Creatinine, Ser: 1.18 mg/dL — ABNORMAL HIGH (ref 0.44–1.00)
GFR, EST AFRICAN AMERICAN: 47 mL/min — AB (ref >60)
GFR, EST NON AFRICAN AMERICAN: 41 mL/min — AB (ref >60)
GLUCOSE: 90 mg/dL (ref 65–99)
POTASSIUM: 3.5 mmol/L (ref 3.5–5.1)
SODIUM: 135 mmol/L (ref 135–145)
TOTAL PROTEIN: 6.4 g/dL — AB (ref 6.5–8.1)

## 2017-01-22 LAB — CBC WITH DIFFERENTIAL/PLATELET
BASOS ABS: 0.1 10*3/uL (ref 0–0.1)
BASOS PCT: 1 %
EOS ABS: 0.3 10*3/uL (ref 0–0.7)
EOS PCT: 5 %
HCT: 27.6 % — ABNORMAL LOW (ref 35.0–47.0)
HEMOGLOBIN: 9.3 g/dL — AB (ref 12.0–16.0)
LYMPHS ABS: 1.2 10*3/uL (ref 1.0–3.6)
Lymphocytes Relative: 17 %
MCH: 33.2 pg (ref 26.0–34.0)
MCHC: 33.7 g/dL (ref 32.0–36.0)
MCV: 98.5 fL (ref 80.0–100.0)
Monocytes Absolute: 0.6 10*3/uL (ref 0.2–0.9)
Monocytes Relative: 9 %
Neutro Abs: 4.6 10*3/uL (ref 1.4–6.5)
Neutrophils Relative %: 68 %
PLATELETS: 368 10*3/uL (ref 150–440)
RBC: 2.8 MIL/uL — AB (ref 3.80–5.20)
RDW: 22.5 % — ABNORMAL HIGH (ref 11.5–14.5)
WBC: 6.8 10*3/uL (ref 3.6–11.0)

## 2017-02-13 ENCOUNTER — Observation Stay
Admission: EM | Admit: 2017-02-13 | Discharge: 2017-02-14 | Disposition: A | Discharge status: Home / Self Care | Payer: Medicare Other | Attending: Internal Medicine | Admitting: Internal Medicine

## 2017-02-13 ENCOUNTER — Encounter: Payer: Self-pay | Admitting: Emergency Medicine

## 2017-02-13 DIAGNOSIS — Z9012 Acquired absence of left breast and nipple: Secondary | ICD-10-CM | POA: Diagnosis not present

## 2017-02-13 DIAGNOSIS — N183 Chronic kidney disease, stage 3 (moderate): Secondary | ICD-10-CM | POA: Diagnosis not present

## 2017-02-13 DIAGNOSIS — I4581 Long QT syndrome: Secondary | ICD-10-CM | POA: Diagnosis not present

## 2017-02-13 DIAGNOSIS — I130 Hypertensive heart and chronic kidney disease with heart failure and stage 1 through stage 4 chronic kidney disease, or unspecified chronic kidney disease: Secondary | ICD-10-CM | POA: Diagnosis not present

## 2017-02-13 DIAGNOSIS — Z79899 Other long term (current) drug therapy: Secondary | ICD-10-CM | POA: Diagnosis not present

## 2017-02-13 DIAGNOSIS — R001 Bradycardia, unspecified: Secondary | ICD-10-CM | POA: Insufficient documentation

## 2017-02-13 DIAGNOSIS — I38 Endocarditis, valve unspecified: Secondary | ICD-10-CM | POA: Insufficient documentation

## 2017-02-13 DIAGNOSIS — I5032 Chronic diastolic (congestive) heart failure: Secondary | ICD-10-CM | POA: Diagnosis not present

## 2017-02-13 DIAGNOSIS — K5900 Constipation, unspecified: Secondary | ICD-10-CM | POA: Insufficient documentation

## 2017-02-13 DIAGNOSIS — E1122 Type 2 diabetes mellitus with diabetic chronic kidney disease: Secondary | ICD-10-CM | POA: Diagnosis not present

## 2017-02-13 DIAGNOSIS — R9431 Abnormal electrocardiogram [ECG] [EKG]: Secondary | ICD-10-CM | POA: Diagnosis present

## 2017-02-13 DIAGNOSIS — Z8249 Family history of ischemic heart disease and other diseases of the circulatory system: Secondary | ICD-10-CM | POA: Insufficient documentation

## 2017-02-13 DIAGNOSIS — I4891 Unspecified atrial fibrillation: Secondary | ICD-10-CM | POA: Insufficient documentation

## 2017-02-13 DIAGNOSIS — Z791 Long term (current) use of non-steroidal anti-inflammatories (NSAID): Secondary | ICD-10-CM | POA: Insufficient documentation

## 2017-02-13 DIAGNOSIS — Z9049 Acquired absence of other specified parts of digestive tract: Secondary | ICD-10-CM | POA: Insufficient documentation

## 2017-02-13 DIAGNOSIS — Z853 Personal history of malignant neoplasm of breast: Secondary | ICD-10-CM | POA: Diagnosis not present

## 2017-02-13 DIAGNOSIS — E876 Hypokalemia: Secondary | ICD-10-CM | POA: Diagnosis not present

## 2017-02-13 DIAGNOSIS — K219 Gastro-esophageal reflux disease without esophagitis: Secondary | ICD-10-CM | POA: Insufficient documentation

## 2017-02-13 DIAGNOSIS — E785 Hyperlipidemia, unspecified: Secondary | ICD-10-CM | POA: Diagnosis not present

## 2017-02-13 DIAGNOSIS — Z9889 Other specified postprocedural states: Secondary | ICD-10-CM | POA: Insufficient documentation

## 2017-02-13 DIAGNOSIS — W19XXXA Unspecified fall, initial encounter: Secondary | ICD-10-CM | POA: Diagnosis not present

## 2017-02-13 DIAGNOSIS — Z720 Tobacco use: Secondary | ICD-10-CM | POA: Diagnosis not present

## 2017-02-13 DIAGNOSIS — D510 Vitamin B12 deficiency anemia due to intrinsic factor deficiency: Secondary | ICD-10-CM | POA: Diagnosis not present

## 2017-02-13 DIAGNOSIS — E039 Hypothyroidism, unspecified: Secondary | ICD-10-CM | POA: Insufficient documentation

## 2017-02-13 LAB — URINALYSIS, COMPLETE (UACMP) WITH MICROSCOPIC
Bacteria, UA: NONE SEEN
Bilirubin Urine: NEGATIVE
Glucose, UA: NEGATIVE mg/dL
Hgb urine dipstick: NEGATIVE
Ketones, ur: NEGATIVE mg/dL
Nitrite: NEGATIVE
PH: 6 (ref 5.0–8.0)
Protein, ur: NEGATIVE mg/dL
RBC / HPF: NONE SEEN RBC/hpf (ref 0–5)
SPECIFIC GRAVITY, URINE: 1.006 (ref 1.005–1.030)
SQUAMOUS EPITHELIAL / LPF: NONE SEEN

## 2017-02-13 LAB — MAGNESIUM
MAGNESIUM: 1.9 mg/dL (ref 1.7–2.4)
MAGNESIUM: 1.9 mg/dL (ref 1.7–2.4)

## 2017-02-13 LAB — BASIC METABOLIC PANEL
ANION GAP: 9 (ref 5–15)
BUN: 11 mg/dL (ref 6–20)
CALCIUM: 8.6 mg/dL — AB (ref 8.9–10.3)
CO2: 28 mmol/L (ref 22–32)
CREATININE: 1.11 mg/dL — AB (ref 0.44–1.00)
Chloride: 98 mmol/L — ABNORMAL LOW (ref 101–111)
GFR calc non Af Amer: 44 mL/min — ABNORMAL LOW (ref >60)
GFR, EST AFRICAN AMERICAN: 51 mL/min — AB (ref >60)
Glucose, Bld: 140 mg/dL — ABNORMAL HIGH (ref 65–99)
Potassium: 2.9 mmol/L — ABNORMAL LOW (ref 3.5–5.1)
SODIUM: 135 mmol/L (ref 135–145)

## 2017-02-13 LAB — TROPONIN I

## 2017-02-13 LAB — CBC
HEMATOCRIT: 34.5 % — AB (ref 35.0–47.0)
HEMOGLOBIN: 11.7 g/dL — AB (ref 12.0–16.0)
MCH: 32.8 pg (ref 26.0–34.0)
MCHC: 33.9 g/dL (ref 32.0–36.0)
MCV: 96.8 fL (ref 80.0–100.0)
Platelets: 320 10*3/uL (ref 150–440)
RBC: 3.56 MIL/uL — ABNORMAL LOW (ref 3.80–5.20)
RDW: 18.4 % — AB (ref 11.5–14.5)
WBC: 9.2 10*3/uL (ref 3.6–11.0)

## 2017-02-13 LAB — GLUCOSE, CAPILLARY: Glucose-Capillary: 116 mg/dL — ABNORMAL HIGH (ref 65–99)

## 2017-02-13 MED ORDER — BENZONATATE 100 MG PO CAPS: 200.0000 mg | ORAL_CAPSULE | Freq: Three times a day (TID) | ORAL | Status: DC | PRN

## 2017-02-13 MED ORDER — INSULIN ASPART 100 UNIT/ML ~~LOC~~ SOLN: 0.0000 [IU] | Freq: Three times a day (TID) | SUBCUTANEOUS | Status: DC

## 2017-02-13 MED ORDER — ENOXAPARIN SODIUM 40 MG/0.4ML ~~LOC~~ SOLN
40.0000 mg | SUBCUTANEOUS | Status: DC
  Administered 2017-02-13: 40 mg via SUBCUTANEOUS
  Filled 2017-02-13: qty 0.4

## 2017-02-13 MED ORDER — AMLODIPINE BESYLATE 10 MG PO TABS
10.0000 mg | ORAL_TABLET | Freq: Every day | ORAL | Status: DC
  Administered 2017-02-13 – 2017-02-14 (×2): 10 mg via ORAL
  Filled 2017-02-13 (×2): qty 1

## 2017-02-13 MED ORDER — PANTOPRAZOLE SODIUM 40 MG PO TBEC
40.0000 mg | DELAYED_RELEASE_TABLET | Freq: Every day | ORAL | Status: DC
  Administered 2017-02-14: 40 mg via ORAL
  Filled 2017-02-13: qty 1

## 2017-02-13 MED ORDER — ACETAMINOPHEN 650 MG RE SUPP: 650.0000 mg | Freq: Four times a day (QID) | RECTAL | Status: DC | PRN

## 2017-02-13 MED ORDER — ENALAPRILAT 1.25 MG/ML IV SOLN
1.2500 mg | Freq: Four times a day (QID) | INTRAVENOUS | Status: DC | PRN
  Administered 2017-02-13: 1.25 mg via INTRAVENOUS
  Filled 2017-02-13 (×2): qty 1

## 2017-02-13 MED ORDER — POTASSIUM CHLORIDE 10 MEQ/100ML IV SOLN
10.0000 meq | Freq: Once | INTRAVENOUS | Status: AC
  Administered 2017-02-13: 10 meq via INTRAVENOUS
  Filled 2017-02-13: qty 100

## 2017-02-13 MED ORDER — ONDANSETRON HCL 4 MG/2ML IJ SOLN: 4.0000 mg | Freq: Four times a day (QID) | INTRAMUSCULAR | Status: DC | PRN

## 2017-02-13 MED ORDER — ACETAMINOPHEN 325 MG PO TABS
650.0000 mg | ORAL_TABLET | Freq: Four times a day (QID) | ORAL | Status: DC | PRN
  Administered 2017-02-13: 650 mg via ORAL
  Filled 2017-02-13: qty 2

## 2017-02-13 MED ORDER — ONDANSETRON HCL 4 MG PO TABS: 4.0000 mg | ORAL_TABLET | Freq: Four times a day (QID) | ORAL | Status: DC | PRN

## 2017-02-13 MED ORDER — POTASSIUM CHLORIDE 20 MEQ PO PACK
40.0000 meq | PACK | ORAL | Status: AC
Start: 2017-02-13 — End: 2017-02-14
  Administered 2017-02-13 – 2017-02-14 (×4): 40 meq via ORAL
  Filled 2017-02-13 (×3): qty 2

## 2017-02-13 MED ORDER — PREDNISONE 20 MG PO TABS: 40.0000 mg | ORAL_TABLET | Freq: Once | ORAL | Status: DC

## 2017-02-13 MED ORDER — HYDRALAZINE HCL 20 MG/ML IJ SOLN
10.0000 mg | Freq: Four times a day (QID) | INTRAMUSCULAR | Status: DC | PRN
  Administered 2017-02-13: 10 mg via INTRAVENOUS
  Filled 2017-02-13 (×2): qty 1

## 2017-02-13 MED ORDER — IPRATROPIUM-ALBUTEROL 0.5-2.5 (3) MG/3ML IN SOLN: 3.0000 mL | Freq: Once | RESPIRATORY_TRACT | Status: DC

## 2017-02-13 MED ORDER — POTASSIUM CHLORIDE 20 MEQ PO PACK
PACK | ORAL | Status: AC
  Filled 2017-02-13: qty 2

## 2017-02-13 MED ORDER — PRAVASTATIN SODIUM 40 MG PO TABS
40.0000 mg | ORAL_TABLET | Freq: Every day | ORAL | Status: DC
  Administered 2017-02-13: 40 mg via ORAL
  Filled 2017-02-13: qty 1

## 2017-02-13 MED ORDER — VITAMIN D 1000 UNITS PO TABS
1000.0000 [IU] | ORAL_TABLET | Freq: Every day | ORAL | Status: DC
  Administered 2017-02-13 – 2017-02-14 (×2): 1000 [IU] via ORAL
  Filled 2017-02-13 (×2): qty 1

## 2017-02-13 MED ORDER — SENNA 8.6 MG PO TABS
1.0000 | ORAL_TABLET | Freq: Two times a day (BID) | ORAL | Status: DC
  Administered 2017-02-13 – 2017-02-14 (×2): 8.6 mg via ORAL
  Filled 2017-02-13 (×2): qty 1

## 2017-02-13 MED ORDER — TORSEMIDE 20 MG PO TABS
40.0000 mg | ORAL_TABLET | Freq: Every day | ORAL | Status: DC
  Administered 2017-02-14: 40 mg via ORAL
  Filled 2017-02-13: qty 2

## 2017-02-13 MED ORDER — POLYETHYLENE GLYCOL 3350 17 G PO PACK: 17.0000 g | PACK | Freq: Every day | ORAL | Status: DC | PRN

## 2017-02-13 MED ORDER — AZELASTINE HCL 0.1 % NA SOLN
1.0000 | Freq: Every day | NASAL | Status: DC | PRN
  Filled 2017-02-13: qty 30

## 2017-02-13 MED ORDER — DULOXETINE HCL 30 MG PO CPEP
60.0000 mg | ORAL_CAPSULE | Freq: Every day | ORAL | Status: DC
  Administered 2017-02-14: 60 mg via ORAL
  Filled 2017-02-13: qty 2

## 2017-02-13 MED ORDER — CLONIDINE HCL 0.1 MG PO TABS
0.2000 mg | ORAL_TABLET | ORAL | Status: AC
  Administered 2017-02-13: 0.2 mg via ORAL
  Filled 2017-02-13: qty 2

## 2017-02-13 MED ORDER — LEVOTHYROXINE SODIUM 75 MCG PO TABS
75.0000 ug | ORAL_TABLET | Freq: Every day | ORAL | Status: DC
  Administered 2017-02-13 – 2017-02-14 (×2): 75 ug via ORAL
  Filled 2017-02-13 (×2): qty 1

## 2017-02-13 NOTE — ED Provider Notes (Signed)
[facility] Emergency Department Provider Note   ____________________________________________   First MD Initiated Contact with Patient 02/13/17 1006     (approximate)  I have reviewed the triage vital signs and the nursing notes.   HISTORY  Chief Complaint Fall    HPI Tammie Mcmahon is a 83 y.o. female Patient comes after a fall. Appears mechanical in nature, reports that she was going to sit down and accidentally missed the front of the chair, falling onto her buttock. Denies any injury but did require assistance getting up, EMS was called and she was transported for further evaluation. She denies to any king any anticoagulants. Denies any symptoms, no pain in the buttock, no pain in the hips or legs. Denies back pain. She reports that she feels fine, but has also had a heart monitor on for low heart rates which she saw her cardiologist yesterday.  Denies any chest pain or trouble breathing. No numbness tingling or weakness.   Past Medical History:  Diagnosis Date  . (HFpEF) heart failure with preserved ejection fraction ([place])   . Atrial fibrillation and flutter ([place])   . Breast cancer ([place])   . CKD (chronic kidney disease), stage III   . Diabetes mellitus without complication ([place])   . Hyperlipidemia   . Hypertension   . Low back pain   . Menopausal and postmenopausal disorder   . PA (pernicious anemia)   . Thyroid disease   . Valvular heart disease     Patient Active Problem List   Diagnosis Date Noted  . Fall 02/13/2017  . Syncope 01/04/2017  . Lower abdominal pain 01/04/2017  . CKD (chronic kidney disease), stage III 01/04/2017  . Elevated troponin 01/04/2017  . Hypokalemia 01/04/2017  . Leukocytosis 01/04/2017  . Dysuria 01/04/2017  . Constipation 01/04/2017  . Symptomatic bradycardia 10/22/2016  . Atrial fibrillation ([place]) 10/22/2016  . Heart failure with preserved ejection fraction ([place]) 10/22/2016  . Hypertension  10/22/2016  . Hyperlipidemia 10/22/2016  . Acute on chronic renal failure ([place]) 10/22/2016  . Other specified cardiac arrhythmias (CODE) 10/16/2016  . Confusion 09/10/2016    Past Surgical History:  Procedure Laterality Date  . BREAST SURGERY    . GALLBLADDER SURGERY    . MASTECTOMY Left   . THYROIDECTOMY      Prior to Admission medications   Medication Sig Start Date End Date Taking? Authorizing Provider  amiodarone (PACERONE) 200 MG tablet Take 400 mg by mouth daily. 12/26/16   [provider]  azelastine (ASTELIN) 0.1 % nasal spray Place 1 spray into both nostrils daily as needed for allergies.  09/30/16   [provider]  benzonatate (TESSALON) 200 MG capsule Take 1 capsule by mouth 3 (three) times daily as needed for cough.  09/30/16   [provider]  cholecalciferol (VITAMIN D) 1000 units tablet Take 1,000 Units by mouth daily.    [provider]  diclofenac sodium (VOLTAREN) 1 % GEL Apply topically 4 (four) times daily as needed.     [provider]  DULoxetine (CYMBALTA) 60 MG capsule Take 60 mg by mouth daily.     [provider]  levothyroxine (SYNTHROID, LEVOTHROID) 75 MCG tablet Take 1 tablet (75 mcg total) by mouth daily before breakfast. 10/24/16   Max Sane, MD  omeprazole (PRILOSEC) 20 MG capsule Take 20 mg by mouth daily.    [provider]  polyethylene glycol (MIRALAX / GLYCOLAX) packet Take 17 g by mouth daily as needed for  moderate constipation. 01/07/17   Max Sane, MD  potassium chloride (MICRO-K) 10 MEQ CR capsule Take 10 mEq by mouth daily.  04/10/14   [provider]  pravastatin (PRAVACHOL) 40 MG tablet Take 40 mg by mouth at bedtime.  05/19/14   [provider]  senna (SENOKOT) 8.6 MG TABS tablet Take 1 tablet (8.6 mg total) by mouth 2 (two) times daily. 01/07/17   Max Sane, MD  torsemide (DEMADEX) 20 MG tablet Take 40 mg by mouth daily.    [provider]     Allergies Patient has no known allergies.  Family History  Problem Relation Age of Onset  . Heart attack Father     Social History Social History  Substance Use Topics  . Smoking status: Never Smoker  . Smokeless tobacco: Current User    Types: Snuff  . Alcohol use No    Review of Systems Constitutional: No fever/chills Eyes: No visual changes. ENT: No sore throat. Cardiovascular: Denies chest pain. Respiratory: Denies shortness of breath. Gastrointestinal: No abdominal pain.  No nausea, no vomiting.  No diarrhea.  No constipation. Genitourinary: Negative for dysuria. Musculoskeletal: Negative for back pain. Skin: Negative for rash. Neurological: Negative for headaches, focal weakness or numbness.  10-point ROS otherwise negative.  ____________________________________________   PHYSICAL EXAM:  VITAL SIGNS: ED Triage Vitals  Enc Vitals Group     BP 02/13/17 0916 (!) 174/80     Pulse Rate 02/13/17 0916 (!) 56     Resp 02/13/17 0916 18     Temp 02/13/17 0916 98 F (36.7 C)     Temp Source 02/13/17 0916 Oral     SpO2 02/13/17 0916 97 %     Weight 02/13/17 0914 160 lb (72.6 kg)     Height --      Head Circumference --      Peak Flow --      Pain Score --      Pain Loc --      Pain Edu? --      Excl. in [place]? --     Constitutional: Alert and oriented. Well appearing and in no acute distress.Very pleasant. Eyes: Conjunctivae are normal. PERRL. EOMI. Head: Atraumatic. Nose: No congestion/rhinnorhea. Mouth/Throat: Mucous membranes are moist.  Oropharynx non-erythematous. Neck: No stridor.   Cardiovascular: Bradycardic rate, regular rhythm. Grossly normal heart sounds.  Good peripheral circulation. Respiratory: Normal respiratory effort.  No retractions. Lungs CTAB. Gastrointestinal: Soft and nontender. No distention. Musculoskeletal:  RIGHT Right upper extremity demonstrates normal strength, good use of all muscles. No edema bruising or contusions of the  right shoulder/upper arm, right elbow, right forearm / hand. Full range of motion of the right right upper extremity without pain. No evidence of trauma. Strong radial pulse. Intact median/ulnar/radial neuro-muscular exam.  LEFT Left upper extremity demonstrates normal strength, good use of all muscles. No edema bruising or contusions of the left shoulder/upper arm, left elbow, left forearm / hand. Full range of motion of the left  upper extremity without pain. No evidence of trauma. Strong radial pulse. Intact median/ulnar/radial neuro-muscular exam.   Lower Extremities  No edema. Normal DP/PT pulses bilateral with good cap refill.  Normal neuro-motor function lower extremities bilateral.  RIGHT Right lower extremity demonstrates normal strength, good use of all muscles. No edema bruising or contusions of the right hip, right knee, right ankle. Full range of motion of the right lower extremity without pain. No pain on axial loading. No evidence of trauma.  LEFT Left  lower extremity demonstrates normal strength, good use of all muscles. No edema bruising or contusions of the hip,  knee, ankle. Full range of motion of the left lower extremity without pain. No pain on axial loading. No evidence of trauma.  No lower extremity tenderness nor edema.  No joint effusions.  Neurologic:  Normal speech and language. No gross focal neurologic deficits are appreciated. Pupils patient is able to stand and walk with a walker with no pain or concerns. Stable gait. Skin:  Skin is warm, dry and intact. No rash noted. Psychiatric: Mood and affect are normal. Speech and behavior are normal.  ____________________________________________   LABS (all labs ordered are listed, but only abnormal results are displayed)  Labs Reviewed  CBC - Abnormal; Notable for the following:       Result Value   RBC 3.56 (*)    Hemoglobin 11.7 (*)    HCT 34.5 (*)    RDW 18.4 (*)    All other components within normal  limits  BASIC METABOLIC PANEL - Abnormal; Notable for the following:    Potassium 2.9 (*)    Chloride 98 (*)    Glucose, Bld 140 (*)    Creatinine, Ser 1.11 (*)    Calcium 8.6 (*)    GFR calc non Af Amer 44 (*)    GFR calc Af Amer 51 (*)    All other components within normal limits  MAGNESIUM  TROPONIN I  URINALYSIS, COMPLETE (UACMP) WITH MICROSCOPIC   ____________________________________________  EKG  Reviewed and interpreted by me at 9:40 AM Heart rate 50 QRS 110 QTc 610 Sinus bradycardia, notable T-wave abnormality including a possible U wave versus a very unusual back or biphasic appearance to the T wave versus less likely P-wave  Appears abnormal compared to previous. No obvious evidence of an ischemic change however, but concern is raised for a possible electrolyte abnormality or medication side effect causing notable U wave or QT prolongation This EKG was reviewed with Dr. Clayborn Bigness via [place] who advises that potassium needs to be repleted, amiodarone and low potassium are likely responsible for EKG change. ____________________________________________  RADIOLOGY   ____________________________________________   PROCEDURES  Procedure(s) performed: None  Procedures  Critical Care performed: No  ____________________________________________   INITIAL IMPRESSION / ASSESSMENT AND PLAN / ED COURSE  Pertinent labs & imaging results that were available during my care of the patient were reviewed by me and considered in my medical decision making (see chart for details).  Patient presents after a fall. Appears mechanical in nature. Notably she does have a Holter monitor on, and today's EKG though asymptomatic is unusual with significant QT prolongation versus a U wave, however on labs for potassium is low and potentially a contributory factor. She denies any syncopal or presyncopal symptoms. No cardiac neurologic or pulmonary symptoms. She is able to ambulate and denies  any pain in the buttock and low back or lower extremities, with no evidence of trauma noted  Case discussed with Dr. Clayborn Bigness. Now also discussed with the patient, due to hypokalemia with marked abnormality of her 12-lead and also concomitant bradycardia with amiodarone use, I discussed with the patient and will manage her for further observation, cardiology consultation, and correction of hypokalemia due to associated EKG abnormality. The patient is felt to be at risk for developing arrhythmia, including polymorphic V. tach due to significant prolongation of her QT and requires correction. Dr. Clayborn Bigness recommends hold amiodarone, replete potassium via IV and oral.  ____________________________________________   FINAL CLINICAL IMPRESSION(S) / ED DIAGNOSES  Final diagnoses:  Fall, initial encounter  Hypokalemia  Prolonged Q-T interval on ECG      NEW MEDICATIONS STARTED DURING THIS VISIT:  New Prescriptions   No medications on file     Note:  This document was prepared using Dragon voice recognition software and may include unintentional dictation errors.     Delman Kitten, MD 02/13/17 [phone]

## 2017-02-13 NOTE — Care Management Obs Status (Signed)
[place] NOTIFICATION   Patient Details  Name: Tammie Mcmahon MRN: [id number] Date of Birth: 11-25-1927   Medicare Observation Status Notification Given:  Yes    Katrina Stack, RN 02/13/2017, 3:17 PM

## 2017-02-13 NOTE — Clinical Social Work Note (Signed)
Clinical Social Work Assessment  Patient Details  Name: Tammie Mcmahon MRN: [id number] Date of Birth: 1928-09-05  Date of referral:  02/13/17               Reason for consult:  Facility Placement                Permission sought to share information with:  Facility Sport and exercise psychologist, Family Supports Permission granted to share information::  Yes, Verbal Permission Granted  Name::     Danella Deis Sister [phone] or King,Janice Niece [phone]  [phone] or Joanna Puff [phone]   Agency::  [facility]  Relationship::     Contact Information:     Housing/Transportation Living arrangements for the past 2 months:  [place], [place] of Information:  Patient Patient Interpreter Needed:  None Criminal Activity/Legal Involvement Pertinent to Current Situation/Hospitalization:  No - Comment as needed Significant Relationships:  Other Family Members Lives with:  Facility Resident Do you feel safe going back to the place where you live?  Yes Need for family participation in patient care:  No (Coment)  Care giving concerns:  Patient states she does not have any issues with returning to [facility].   Social Worker assessment / plan:  Patient is an 83 year old female who is a resident at [facility].  Patient states she has been living at the ALF for about 3 weeks, and was at [facility] for short term rehab previous to moving into the ALF.  Patient states that she is ok with the care that is being provided.  Patient states she is in agreement to returning back to ALF once she is ready for discharge from hospital.  Patient did not have any other questions or concerns.  Employment status:  Retired Nurse, adult, Medicaid In [place] PT Recommendations:  Not assessed at this time Information / Referral to community resources:     Patient/Family's Response to care:  Patient is in  agreement to going back to ALF.  Patient/Family's Understanding of and Emotional Response to Diagnosis, Current Treatment, and Prognosis:  Patient expressed that she was disappointed that she can not return back to her home, but she has accepted that she needs to stay in ALF level of care.  Emotional Assessment Appearance:  Appears stated age Attitude/Demeanor/Rapport:    Affect (typically observed):  Appropriate, Calm, Stable Orientation:  Oriented to Self, Oriented to Place, Oriented to  Time, Oriented to Situation Alcohol / Substance use:  Not Applicable Psych involvement (Current and /or in the community):  No (Comment)  Discharge Needs  Concerns to be addressed:  No discharge needs identified Readmission within the last 30 days:  No Current discharge risk:  None Barriers to Discharge:  No Barriers Identified   Ross Ludwig, LCSWA 02/13/2017, 5:12 PM

## 2017-02-13 NOTE — Progress Notes (Signed)
md was notified of bp 188/56 and have no further meds to give at this time.

## 2017-02-13 NOTE — Clinical Social Work Note (Signed)
CSW received consult that patient is from [employer], CSW to meet with patient and complete assessment.  Jones Broom. [place], MSW, [place]  02/13/2017 1:53 PM

## 2017-02-13 NOTE — Progress Notes (Signed)
Advanced care plan.  Purpose of the Encounter: CODE STATUS  Parties in Attendance:Patient   Patient's Decision Capacity:intact  Subjective/Patient's story:Pt is 83 y.o with chf, bradycardia , ckd evaluated for fall    Objective/Medical story discussed with the patient regarding her wishes for intubation and resuscitation she would like aggressive therapy and would like to be a full code    Goals of care determination: code remains full    CODE STATUS:  full Time spent discussing advanced care planning: 15 minutes

## 2017-02-13 NOTE — NC FL2 (Signed)
[place] LEVEL OF CARE SCREENING TOOL     IDENTIFICATION  Patient Name: Tammie Mcmahon Birthdate: 1928-02-25 Sex: female Admission Date (Current Location): 02/13/2017  [place] and [place] Number:  Selena Lesser [id number] [place] and Address:  [facility], [address] [place], [place] [address]      Provider Number: [id number]  Attending Physician Name and Address:  Dustin Flock, MD  Relative Name and Phone Number:  Danella Deis Sister [phone] or King,Janice Niece [phone]  [phone] or Stutts,Ray Other [phone]     Current Level of Care: Hospital Recommended Level of Care: [place] Prior Approval Number:    Date Approved/Denied:   PASRR Number:    Discharge Plan: Domiciliary (Rest home) [facility])    Current Diagnoses: Patient Active Problem List   Diagnosis Date Noted  . Fall 02/13/2017  . Syncope 01/04/2017  . Lower abdominal pain 01/04/2017  . CKD (chronic kidney disease), stage III 01/04/2017  . Elevated troponin 01/04/2017  . Hypokalemia 01/04/2017  . Leukocytosis 01/04/2017  . Dysuria 01/04/2017  . Constipation 01/04/2017  . Symptomatic bradycardia 10/22/2016  . Atrial fibrillation ([place]) 10/22/2016  . Heart failure with preserved ejection fraction ([place]) 10/22/2016  . Hypertension 10/22/2016  . Hyperlipidemia 10/22/2016  . Acute on chronic renal failure ([place]) 10/22/2016  . Other specified cardiac arrhythmias (CODE) 10/16/2016  . Confusion 09/10/2016    Orientation RESPIRATION BLADDER Height & Weight     Self, Time, Situation, Place  Normal Continent Weight: 160 lb (72.6 kg) Height:     BEHAVIORAL SYMPTOMS/MOOD NEUROLOGICAL BOWEL NUTRITION STATUS      Continent Diet (Low sodium heart healthy)  AMBULATORY STATUS COMMUNICATION OF NEEDS Skin   Limited Assist Verbally Normal                       Personal Care Assistance Level of Assistance  Bathing,  Feeding, Dressing Bathing Assistance: Limited assistance Feeding assistance: Independent Dressing Assistance: Limited assistance     Functional Limitations Info  Sight, Hearing, Speech Sight Info: Adequate Hearing Info: Adequate Speech Info: Adequate    SPECIAL CARE FACTORS FREQUENCY                       Contractures Contractures Info: Not present    Additional Factors Info  Code Status, Allergies, Insulin Sliding Scale Code Status Info: Full Code Allergies Info: NKA   Insulin Sliding Scale Info: insulin aspart (novoLOG) injection 0-9 Units 3x a day with meals       Current Medications (02/13/2017):  This is the current hospital active medication list Current Facility-Administered Medications  Medication Dose Route Frequency Provider Last Rate Last Dose  . acetaminophen (TYLENOL) tablet 650 mg  650 mg Oral Q6H PRN Dustin Flock, MD   650 mg at 02/13/17 1521   Or  . acetaminophen (TYLENOL) suppository 650 mg  650 mg Rectal Q6H PRN Dustin Flock, MD      . amLODipine (NORVASC) tablet 10 mg  10 mg Oral Daily Dustin Flock, MD   10 mg at 02/13/17 1715  . azelastine (ASTELIN) 0.1 % nasal spray 1 spray  1 spray Each Nare Daily PRN Dustin Flock, MD      . benzonatate (TESSALON) capsule 200 mg  200 mg Oral TID PRN Dustin Flock, MD      . cholecalciferol (VITAMIN D) tablet 1,000 Units  1,000 Units Oral Daily Dustin Flock, MD   1,000 Units at 02/13/17 1715  . [  START ON 02/14/2017] DULoxetine (CYMBALTA) DR capsule 60 mg  60 mg Oral Daily Dustin Flock, MD      . enalaprilat (VASOTEC) injection 1.25 mg  1.25 mg Intravenous Q6H PRN Dustin Flock, MD      . enoxaparin (LOVENOX) injection 40 mg  40 mg Subcutaneous Q24H Dustin Flock, MD      . hydrALAZINE (APRESOLINE) injection 10 mg  10 mg Intravenous Q6H PRN Dustin Flock, MD   10 mg at 02/13/17 1254  . insulin aspart (novoLOG) injection 0-9 Units  0-9 Units Subcutaneous TID WC Dustin Flock, MD   Stopped at  02/13/17 1200  . levothyroxine (SYNTHROID, LEVOTHROID) tablet 75 mcg  75 mcg Oral QAC breakfast Dustin Flock, MD   75 mcg at 02/13/17 1716  . ondansetron (ZOFRAN) tablet 4 mg  4 mg Oral Q6H PRN Dustin Flock, MD       Or  . ondansetron [facility]) injection 4 mg  4 mg Intravenous Q6H PRN Dustin Flock, MD      . Derrill Memo ON 02/14/2017] pantoprazole (PROTONIX) EC tablet 40 mg  40 mg Oral Daily Dustin Flock, MD      . polyethylene glycol (MIRALAX / GLYCOLAX) packet 17 g  17 g Oral Daily PRN Dustin Flock, MD      . potassium chloride (KLOR-CON) packet 40 mEq  40 mEq Oral Q4H Dustin Flock, MD   40 mEq at 02/13/17 1715  . pravastatin (PRAVACHOL) tablet 40 mg  40 mg Oral QHS Dustin Flock, MD      . senna [facility]) tablet 8.6 mg  1 tablet Oral BID Dustin Flock, MD      . Derrill Memo ON 02/14/2017] torsemide (DEMADEX) tablet 40 mg  40 mg Oral Daily Dustin Flock, MD         Discharge Medications: Current Discharge Medication List        START taking these medications   Details  lisinopril (PRINIVIL,ZESTRIL) 5 MG tablet Take 1 tablet (5 mg total) by mouth daily. Qty: 30 tablet, Refills: 0          CONTINUE these medications which have NOT CHANGED   Details  cholecalciferol (VITAMIN D) 1000 units tablet Take 1,000 Units by mouth daily.    DULoxetine (CYMBALTA) 60 MG capsule Take 60 mg by mouth daily.     !! levothyroxine (SYNTHROID, LEVOTHROID) 100 MCG tablet Take 1 tablet by mouth daily.    omeprazole (PRILOSEC) 20 MG capsule Take 20 mg by mouth daily.    polyethylene glycol (MIRALAX / GLYCOLAX) packet Take 17 g by mouth daily as needed for moderate constipation. Qty: 14 each, Refills: 0    potassium chloride (MICRO-K) 10 MEQ CR capsule Take 10 mEq by mouth daily.     pravastatin (PRAVACHOL) 40 MG tablet Take 40 mg by mouth at bedtime.     senna (SENOKOT) 8.6 MG TABS tablet Take 1 tablet (8.6 mg total) by mouth 2 (two) times daily. Qty: 120 each, Refills: 0     torsemide (DEMADEX) 20 MG tablet Take 40 mg by mouth daily. Pt takes 2 tabs orally daily.    acetaminophen (TYLENOL) 325 MG tablet Take 650 mg by mouth every 6 (six) hours as needed. Pt takes 2 tabs orally as needed for pain.    azelastine (ASTELIN) 0.1 % nasal spray Place 1 spray into both nostrils daily as needed for allergies.  Refills: 0    benzonatate (TESSALON) 200 MG capsule Take 1 capsule by mouth 3 (three) times daily as needed for  cough.  Refills: 0    diclofenac sodium (VOLTAREN) 1 % GEL Apply topically 4 (four) times daily as needed.     !! levothyroxine (SYNTHROID, LEVOTHROID) 75 MCG tablet Take 1 tablet (75 mcg total) by mouth daily before breakfast. Qty: 30 tablet, Refills: 0     !! - Potential duplicate medications found. Please discuss with provider.       STOP taking these medications     amiodarone (PACERONE) 200 MG tablet          Relevant Imaging Results:  Relevant Lab Results:   Additional Information SSN [id number]  Ross Ludwig, [place]

## 2017-02-13 NOTE — ED Triage Notes (Addendum)
Pt reports she was at breakfast and nurse needed to give her medicine and when went to sit missed chair and fell on bottom. c/o pain behind right thigh/buttock. Alert and oriented. On holter monitor. Denies dizziness or weakness

## 2017-02-13 NOTE — Care Management (Signed)
Patient presents from [facility].  She was sent after sitting back in her chair- missed the seat and sat on the floor.  found to have potassium of 1.9.  She has been a resident at [facility] for approximately 3 weeks. Anticipate return.  Physical therapy consult is pending

## 2017-02-13 NOTE — ED Triage Notes (Signed)
From [place] house

## 2017-02-13 NOTE — H&P (Signed)
[facility] NAME: Tammie Mcmahon    MR#:  [id number]  DATE OF BIRTH:  27-Mar-1928  DATE OF ADMISSION:  02/13/2017  PRIMARY CARE PHYSICIAN: Kirk Ruths, MD   REQUESTING/REFERRING PHYSICIAN: Delman Kitten MD  CHIEF COMPLAINT:   Chief Complaint  Patient presents with  . Fall    HISTORY OF PRESENT ILLNESS: Tammie Mcmahon  is a 83 y.o. female with a known history of Congestive heart failure, atrial fibrillation, breast cancer, chronic kidney disease, diabetes type 2, essential hypertension, hyperlipidemia, low back pain. Anemia hypothyroidism who actually was seen by her cardiologist recently and placed on a Holter monitor. Patient lives in assisted facility. And had a fall. More to the emergency room. In the emergency room was noted to have hypokalemia. And had some EKG changes the emergency room  doctor discussed the case with the cardiologist who recommended observation. Patient denies any chest pain or shortness of breath.  PAST MEDICAL HISTORY:   Past Medical History:  Diagnosis Date  . (HFpEF) heart failure with preserved ejection fraction ([place])   . Atrial fibrillation and flutter ([place])   . Breast cancer ([place])   . CKD (chronic kidney disease), stage III   . Diabetes mellitus without complication ([place])   . Hyperlipidemia   . Hypertension   . Low back pain   . Menopausal and postmenopausal disorder   . PA (pernicious anemia)   . Thyroid disease   . Valvular heart disease     PAST SURGICAL HISTORY: Past Surgical History:  Procedure Laterality Date  . BREAST SURGERY    . GALLBLADDER SURGERY    . MASTECTOMY Left   . THYROIDECTOMY      SOCIAL HISTORY:  Social History  Substance Use Topics  . Smoking status: Never Smoker  . Smokeless tobacco: Current User    Types: Snuff  . Alcohol use No    FAMILY HISTORY:  Family History  Problem Relation Age of Onset  . Heart attack Father     DRUG ALLERGIES: No Known  Allergies  REVIEW OF SYSTEMS:   CONSTITUTIONAL: No fever, fatigue or Positive weakness.  EYES: No blurred or double vision.  EARS, NOSE, AND THROAT: No tinnitus or ear pain.  RESPIRATORY: No cough, shortness of breath, wheezing or hemoptysis.  CARDIOVASCULAR: No chest pain, orthopnea, edema.  GASTROINTESTINAL: No nausea, vomiting, diarrhea or abdominal pain.  GENITOURINARY: No dysuria, hematuria.  ENDOCRINE: No polyuria, nocturia,  HEMATOLOGY: No anemia, easy bruising or bleeding SKIN: No rash or lesion. MUSCULOSKELETAL: No joint pain or arthritis.   NEUROLOGIC: No tingling, numbness, weakness.  PSYCHIATRY: No anxiety or depression.   MEDICATIONS AT HOME:  Prior to Admission medications   Medication Sig Start Date End Date Taking? Authorizing Provider  amiodarone (PACERONE) 200 MG tablet Take 200 mg by mouth daily.  12/26/16  Yes [provider]  cholecalciferol (VITAMIN D) 1000 units tablet Take 1,000 Units by mouth daily.   Yes [provider]  DULoxetine (CYMBALTA) 60 MG capsule Take 60 mg by mouth daily.    Yes [provider]  levothyroxine (SYNTHROID, LEVOTHROID) 100 MCG tablet Take 1 tablet by mouth daily. 02/04/17  Yes [provider]  omeprazole (PRILOSEC) 20 MG capsule Take 20 mg by mouth daily.   Yes [provider]  polyethylene glycol (MIRALAX / GLYCOLAX) packet Take 17 g by mouth daily as needed for moderate constipation. 01/07/17  Yes Max Sane, MD  potassium chloride (MICRO-K) 10 MEQ  CR capsule Take 10 mEq by mouth daily.  04/10/14  Yes [provider]  pravastatin (PRAVACHOL) 40 MG tablet Take 40 mg by mouth at bedtime.  05/19/14  Yes [provider]  senna (SENOKOT) 8.6 MG TABS tablet Take 1 tablet (8.6 mg total) by mouth 2 (two) times daily. 01/07/17  Yes Max Sane, MD  torsemide (DEMADEX) 20 MG tablet Take 40 mg by mouth daily. Pt takes 2 tabs orally daily.   Yes [provider]  acetaminophen  (TYLENOL) 325 MG tablet Take 650 mg by mouth every 6 (six) hours as needed. Pt takes 2 tabs orally as needed for pain.    [provider]  azelastine (ASTELIN) 0.1 % nasal spray Place 1 spray into both nostrils daily as needed for allergies.  09/30/16   [provider]  benzonatate (TESSALON) 200 MG capsule Take 1 capsule by mouth 3 (three) times daily as needed for cough.  09/30/16   [provider]  diclofenac sodium (VOLTAREN) 1 % GEL Apply topically 4 (four) times daily as needed.     [provider]  levothyroxine (SYNTHROID, LEVOTHROID) 75 MCG tablet Take 1 tablet (75 mcg total) by mouth daily before breakfast. Patient not taking: Reported on 02/13/2017 10/24/16   Max Sane, MD      PHYSICAL EXAMINATION:   VITAL SIGNS: Blood pressure (!) 162/78, pulse (!) 58, temperature 97.9 F (36.6 C), temperature source Oral, resp. rate 18, weight 160 lb (72.6 kg), SpO2 98 %.  GENERAL:  83 y.o.-year-old patient lying in the bed with no acute distress.  EYES: Pupils equal, round, reactive to light and accommodation. No scleral icterus. Extraocular muscles intact.  HEENT: Head atraumatic, normocephalic. Oropharynx and nasopharynx clear.  NECK:  Supple, no jugular venous distention. No thyroid enlargement, no tenderness.  LUNGS: Normal breath sounds bilaterally, no wheezing, rales,rhonchi or crepitation. No use of accessory muscles of respiration.  CARDIOVASCULAR: S1, S2 normal. No murmurs, rubs, or gallops.  ABDOMEN: Soft, nontender, nondistended. Bowel sounds present. No organomegaly or mass.  EXTREMITIES: No pedal edema, cyanosis, or clubbing.  NEUROLOGIC: Cranial nerves II through XII are intact. Muscle strength 5/5 in all extremities. Sensation intact. Gait not checked.  PSYCHIATRIC: The patient is alert and oriented x 3.  SKIN: No obvious rash, lesion, or ulcer.   LABORATORY PANEL:   CBC  Recent Labs Lab 02/13/17 1004  WBC 9.2  HGB 11.7*  HCT 34.5*   PLT 320  MCV 96.8  MCH 32.8  MCHC 33.9  RDW 18.4*   ------------------------------------------------------------------------------------------------------------------  Chemistries   Recent Labs Lab 02/13/17 1004  NA 135  K 2.9*  CL 98*  CO2 28  GLUCOSE 140*  BUN 11  CREATININE 1.11*  CALCIUM 8.6*  MG 1.9  1.9   ------------------------------------------------------------------------------------------------------------------ estimated creatinine clearance is 36.3 mL/min (A) (by C-G formula based on SCr of 1.11 mg/dL (H)). ------------------------------------------------------------------------------------------------------------------ No results for input(s): TSH, T4TOTAL, T3FREE, THYROIDAB in the last 72 hours.  Invalid input(s): FREET3   Coagulation profile No results for input(s): INR, PROTIME in the last 168 hours. ------------------------------------------------------------------------------------------------------------------- No results for input(s): DDIMER in the last 72 hours. -------------------------------------------------------------------------------------------------------------------  Cardiac Enzymes  Recent Labs Lab 02/13/17 1004  TROPONINI <0.03   ------------------------------------------------------------------------------------------------------------------ Invalid input(s): POCBNP  ---------------------------------------------------------------------------------------------------------------  Urinalysis    Component Value Date/Time   COLORURINE YELLOW (A) 02/13/2017 1120   APPEARANCEUR CLEAR (A) 02/13/2017 1120   APPEARANCEUR Clear 02/27/2013 1014   LABSPEC 1.006 02/13/2017 1120   LABSPEC 1.005 02/27/2013 1014  PHURINE 6.0 02/13/2017 1120   GLUCOSEU NEGATIVE 02/13/2017 1120   GLUCOSEU Negative 02/27/2013 1014   HGBUR NEGATIVE 02/13/2017 [place] 02/13/2017 1120   BILIRUBINUR Negative 02/27/2013 [place] 02/13/2017 1120   PROTEINUR NEGATIVE 02/13/2017 1120   NITRITE NEGATIVE 02/13/2017 1120   LEUKOCYTESUR TRACE (A) 02/13/2017 1120   LEUKOCYTESUR Negative 02/27/2013 1014     RADIOLOGY: No results found.  EKG: Orders placed or performed during the hospital encounter of 02/13/17  . EKG 12-Lead  . EKG 12-Lead  . ED EKG  . ED EKG    IMPRESSION AND PLAN: Patient is a 83 year old y.o with fall with hypokalemia  1. Fall- pt eval  2. Hypokalemia replace K+  3. Recent bradycardia and ekg changes We will ask cardiology to see  4. Hyperlipidemia unspecified continue Pravachol  5. History of diastolic CHF continue Demadex  6. Hypothyroidism continue Synthroid  7. GERD continue omeprazole  All the records are reviewed and case discussed with ED provider. Management plans discussed with the patient, family and they are in agreement.  CODE STATUS:    Code Status Orders        Start     Ordered   02/13/17 1322  Full code  Continuous     02/13/17 1321    Code Status History    Date Active Date Inactive Code Status Order ID Comments User Context   01/04/2017  4:14 PM 01/07/2017  7:51 PM Full Code [id number]  Theodoro Grist, MD Inpatient   10/22/2016  2:28 AM 10/23/2016  6:36 PM Full Code [id number]  Lance Coon, MD ED   10/16/2016  3:47 PM 10/18/2016  3:25 PM Full Code [id number]  Theodoro Grist, MD ED   09/10/2016  5:07 AM 09/15/2016  6:26 PM Full Code [id number]  Harrie Foreman, MD Inpatient    Advance Directive Documentation     Most Recent Value  Type of Advance Directive  Healthcare Power of Attorney  Pre-existing out of facility DNR order (yellow form or pink MOST form)  -  "MOST" Form in Place?  -       TOTAL TIME TAKING CARE OF THIS PATIENT: 74minutes.    Dustin Flock M.D on 02/13/2017 at 3:14 PM  Between 7am to 6pm - Pager - [phone]  After 6pm go to [email or web address] - password [email or web address] [facility]  [facility]  Office   [phone]  CC: Primary care physician; Kirk Ruths, MD

## 2017-02-14 DIAGNOSIS — E876 Hypokalemia: Secondary | ICD-10-CM | POA: Diagnosis not present

## 2017-02-14 LAB — BASIC METABOLIC PANEL
Anion gap: 6 (ref 5–15)
BUN: 11 mg/dL (ref 6–20)
CO2: 29 mmol/L (ref 22–32)
CREATININE: 1.15 mg/dL — AB (ref 0.44–1.00)
Calcium: 8.5 mg/dL — ABNORMAL LOW (ref 8.9–10.3)
Chloride: 105 mmol/L (ref 101–111)
GFR, EST AFRICAN AMERICAN: 48 mL/min — AB (ref >60)
GFR, EST NON AFRICAN AMERICAN: 42 mL/min — AB (ref >60)
Glucose, Bld: 111 mg/dL — ABNORMAL HIGH (ref 65–99)
Potassium: 3.8 mmol/L (ref 3.5–5.1)
SODIUM: 140 mmol/L (ref 135–145)

## 2017-02-14 LAB — CBC
HCT: 32.1 % — ABNORMAL LOW (ref 35.0–47.0)
Hemoglobin: 10.8 g/dL — ABNORMAL LOW (ref 12.0–16.0)
MCH: 32.7 pg (ref 26.0–34.0)
MCHC: 33.7 g/dL (ref 32.0–36.0)
MCV: 96.9 fL (ref 80.0–100.0)
PLATELETS: 290 10*3/uL (ref 150–440)
RBC: 3.31 MIL/uL — ABNORMAL LOW (ref 3.80–5.20)
RDW: 18.9 % — ABNORMAL HIGH (ref 11.5–14.5)
WBC: 8.3 10*3/uL (ref 3.6–11.0)

## 2017-02-14 LAB — GLUCOSE, CAPILLARY
GLUCOSE-CAPILLARY: 120 mg/dL — AB (ref 65–99)
Glucose-Capillary: 118 mg/dL — ABNORMAL HIGH (ref 65–99)

## 2017-02-14 MED ORDER — POTASSIUM CHLORIDE 20 MEQ PO PACK
40.0000 meq | PACK | Freq: Once | ORAL | Status: AC
  Administered 2017-02-14: 40 meq via ORAL
  Filled 2017-02-14: qty 2

## 2017-02-14 MED ORDER — LISINOPRIL 5 MG PO TABS
5.0000 mg | ORAL_TABLET | Freq: Every day | ORAL | Status: DC
  Administered 2017-02-14: 5 mg via ORAL
  Filled 2017-02-14: qty 1

## 2017-02-14 MED ORDER — LISINOPRIL 5 MG PO TABS
5.0000 mg | ORAL_TABLET | Freq: Every day | ORAL | 0 refills | Status: DC
Start: 2017-02-14 — End: 2017-09-15

## 2017-02-14 NOTE — Clinical Social Work Note (Signed)
The patient will discharge to [facility] today. CSW will deliver the packet once the discharge summary is complete. [place] is aware. CSW will continue to follow pending additional discharge needs.  Santiago Bumpers, MSW, Latanya Presser [phone]

## 2017-02-14 NOTE — Consult Note (Signed)
Reason for Consult: Abnormal EKG bradycardia and prolonged QT Referring Physician: Frazier Richards primary physician, Dr. Darvin Neighbours hospitalist Cardiologist Dr. Matthew Saras Bauernfeind is an 83 y.o. female.  HPI: Patient has a history of atrial fibrillation chronic renal insufficiency diabetes hyperlipidemia hypertension valvular disease thyroid disease generalized weakness. Patient has been maintained on amiodarone for rhythm control not on anticoagulation because she is a poor risk. Patient complains of generalized weakness that weekend service slid down and fell at home and was brought in for evaluation EKG suggested bradycardia with abnormally prolonged QT and ultimately was found to have severe hypokalemia. Denied any chest pain did not lose consciousness  Past Medical History:  Diagnosis Date  . (HFpEF) heart failure with preserved ejection fraction ([place])   . Atrial fibrillation and flutter ([place])   . Breast cancer ([place])   . CKD (chronic kidney disease), stage III   . Diabetes mellitus without complication ([place])   . Hyperlipidemia   . Hypertension   . Low back pain   . Menopausal and postmenopausal disorder   . PA (pernicious anemia)   . Thyroid disease   . Valvular heart disease     Past Surgical History:  Procedure Laterality Date  . BREAST SURGERY    . GALLBLADDER SURGERY    . MASTECTOMY Left   . THYROIDECTOMY      Family History  Problem Relation Age of Onset  . Heart attack Father     Social History:  reports that she has never smoked. Her smokeless tobacco use includes Snuff. She reports that she does not drink alcohol or use drugs.  Allergies: No Known Allergies  Medications: I have reviewed the patient's current medications.  Results for orders placed or performed during the hospital encounter of 02/13/17 (from the past 48 hour(s))  CBC     Status: Abnormal   Collection Time: 02/13/17 10:04 AM  Result Value Ref Range   WBC 9.2 3.6 - 11.0 K/uL   RBC  3.56 (L) 3.80 - 5.20 MIL/uL   Hemoglobin 11.7 (L) 12.0 - 16.0 g/dL   HCT 34.5 (L) 35.0 - 47.0 %   MCV 96.8 80.0 - 100.0 fL   MCH 32.8 26.0 - 34.0 pg   MCHC 33.9 32.0 - 36.0 g/dL   RDW 18.4 (H) 11.5 - 14.5 %   Platelets 320 150 - 440 K/uL  Basic metabolic panel     Status: Abnormal   Collection Time: 02/13/17 10:04 AM  Result Value Ref Range   Sodium 135 135 - 145 mmol/L   Potassium 2.9 (L) 3.5 - 5.1 mmol/L   Chloride 98 (L) 101 - 111 mmol/L   CO2 28 22 - 32 mmol/L   Glucose, Bld 140 (H) 65 - 99 mg/dL   BUN 11 6 - 20 mg/dL   Creatinine, Ser 1.11 (H) 0.44 - 1.00 mg/dL   Calcium 8.6 (L) 8.9 - 10.3 mg/dL   GFR calc non Af Amer 44 (L) >60 mL/min   GFR calc Af Amer 51 (L) >60 mL/min    Comment: (NOTE) The eGFR has been calculated using the CKD EPI equation. This calculation has not been validated in all clinical situations. eGFR's persistently <60 mL/min signify possible Chronic Kidney Disease.    Anion gap 9 5 - 15  Magnesium     Status: None   Collection Time: 02/13/17 10:04 AM  Result Value Ref Range   Magnesium 1.9 1.7 - 2.4 mg/dL  Troponin I     Status: None  Collection Time: 02/13/17 10:04 AM  Result Value Ref Range   Troponin I <0.03 <0.03 ng/mL  Magnesium     Status: None   Collection Time: 02/13/17 10:04 AM  Result Value Ref Range   Magnesium 1.9 1.7 - 2.4 mg/dL  Urinalysis, Complete w Microscopic     Status: Abnormal   Collection Time: 02/13/17 11:20 AM  Result Value Ref Range   Color, Urine YELLOW (A) YELLOW   APPearance CLEAR (A) CLEAR   Specific Gravity, Urine 1.006 1.005 - 1.030   pH 6.0 5.0 - 8.0   Glucose, UA NEGATIVE NEGATIVE mg/dL   Hgb urine dipstick NEGATIVE NEGATIVE   Bilirubin Urine NEGATIVE NEGATIVE   Ketones, ur NEGATIVE NEGATIVE mg/dL   Protein, ur NEGATIVE NEGATIVE mg/dL   Nitrite NEGATIVE NEGATIVE   Leukocytes, UA TRACE (A) NEGATIVE   RBC / HPF NONE SEEN 0 - 5 RBC/hpf   WBC, UA 6-30 0 - 5 WBC/hpf   Bacteria, UA NONE SEEN NONE SEEN    Squamous Epithelial / LPF NONE SEEN NONE SEEN   Mucous PRESENT    Hyaline Casts, UA PRESENT   Glucose, capillary     Status: Abnormal   Collection Time: 02/13/17  5:02 PM  Result Value Ref Range   Glucose-Capillary 116 (H) 65 - 99 mg/dL  Basic metabolic panel     Status: Abnormal   Collection Time: 02/14/17  6:14 AM  Result Value Ref Range   Sodium 140 135 - 145 mmol/L   Potassium 3.8 3.5 - 5.1 mmol/L   Chloride 105 101 - 111 mmol/L   CO2 29 22 - 32 mmol/L   Glucose, Bld 111 (H) 65 - 99 mg/dL   BUN 11 6 - 20 mg/dL   Creatinine, Ser 1.15 (H) 0.44 - 1.00 mg/dL   Calcium 8.5 (L) 8.9 - 10.3 mg/dL   GFR calc non Af Amer 42 (L) >60 mL/min   GFR calc Af Amer 48 (L) >60 mL/min    Comment: (NOTE) The eGFR has been calculated using the CKD EPI equation. This calculation has not been validated in all clinical situations. eGFR's persistently <60 mL/min signify possible Chronic Kidney Disease.    Anion gap 6 5 - 15  CBC     Status: Abnormal   Collection Time: 02/14/17  6:14 AM  Result Value Ref Range   WBC 8.3 3.6 - 11.0 K/uL   RBC 3.31 (L) 3.80 - 5.20 MIL/uL   Hemoglobin 10.8 (L) 12.0 - 16.0 g/dL   HCT 32.1 (L) 35.0 - 47.0 %   MCV 96.9 80.0 - 100.0 fL   MCH 32.7 26.0 - 34.0 pg   MCHC 33.7 32.0 - 36.0 g/dL   RDW 18.9 (H) 11.5 - 14.5 %   Platelets 290 150 - 440 K/uL    No results found.  Review of Systems  Constitutional: Positive for malaise/fatigue.  HENT: Positive for hearing loss.   Eyes: Negative.   Respiratory: Positive for shortness of breath.   Cardiovascular: Positive for palpitations and leg swelling.  Gastrointestinal: Negative.   Genitourinary: Negative.   Musculoskeletal: Positive for falls and myalgias.  Skin: Negative.   Neurological: Positive for weakness.  Endo/Heme/Allergies: Negative.   Psychiatric/Behavioral: Negative.    Blood pressure (!) 165/62, pulse (!) 51, temperature 98.2 F (36.8 C), temperature source Oral, resp. rate 18, height 5' 4"  (1.626  m), weight 72.6 kg (160 lb), SpO2 98 %. Physical Exam  Nursing note and vitals reviewed. Constitutional: She is oriented to person,  place, and time. She appears well-developed and well-nourished.  HENT:  Head: Normocephalic and atraumatic.  Eyes: Conjunctivae and EOM are normal. Pupils are equal, round, and reactive to light.  Neck: Normal range of motion.  Cardiovascular: Normal pulses.  An irregularly irregular rhythm present. Bradycardia present.   Murmur heard.  Systolic murmur is present with a grade of 2/6  Respiratory: Effort normal and breath sounds normal.  GI: Soft. Bowel sounds are normal.  Musculoskeletal: Normal range of motion.  Neurological: She is alert and oriented to person, place, and time.  Skin: Skin is warm and dry.  Psychiatric: She has a normal mood and affect.    Assessment/Plan: Atrial fibrillation Bradycardia Hypokalemia Abnormal EKG with QT prolongation Falls Generalized weakness Thyroid disease Hyperlipidemia GERD Diabetes Hypertension . Plan Agree with admit to telemetry Hold amiodarone for now Anticoagulation for DVT prophylaxis Replace potassium Replace magnesium as necessary Continue therapy for hypothyroidism Agree with insulin for diabetes management Continue Protonix pressure control with amlodipine Maintain Pravachol for hyperlipidemia Agree with Protonix for GERD symptoms Consider physical therapy for generalized weakness Recommend follow-up precautions   Dwayne D Callwood 02/14/2017, 7:12 AM

## 2017-02-14 NOTE — Evaluation (Signed)
Physical Therapy Evaluation Patient Details Name: Tammie Mcmahon MRN: [id number] DOB: 25-Dec-1927 Today's Date: 02/14/2017   History of Present Illness  Patient is an 83 y/o F that presents after fall at her facility. Appears this fall may be due to bradycardia and is a recurrent problem.   Clinical Impression  Patient is a pleasant 83 y/o female with recurrent falls both at home and now at a facility. Her PMH is significant for bradycardia (which was evident today - HR of 45). She is able to perform bed mobility with very minimal assistance and ambulate household distance with RW and no loss of balance or obvious safety concerns. She appears at her baseline level of mobility, but would benefit from HHPT for strengthening and balance education given her recent history of falls.     Follow Up Recommendations Home health PT    Equipment Recommendations  Rolling walker with 5" wheels    Recommendations for Other Services       Precautions / Restrictions Precautions Precautions: Fall Restrictions Weight Bearing Restrictions: No      Mobility  Bed Mobility Overal bed mobility: Needs Assistance Bed Mobility: Supine to Sit     Supine to sit: Min assist     General bed mobility comments: Patient requires prolonged time to complete and min A from PT for trunkal assistance.   Transfers Overall transfer level: Needs assistance Equipment used: Rolling walker (2 wheeled) Transfers: Sit to/from Stand Sit to Stand: Min guard         General transfer comment: Patient is able to transfer without PT assistance, no significant loss of balance noted.   Ambulation/Gait Ambulation/Gait assistance: Min guard Ambulation Distance (Feet): 200 Feet Assistive device: Rolling walker (2 wheeled) Gait Pattern/deviations: WFL(Within Functional Limits)   Gait velocity interpretation: at or above normal speed for age/gender General Gait Details: Patient flexes from the trunk until cued  otherwise, no buckling or loss of balance noted during gait. Appropriate speed for her age.   Stairs            Wheelchair Mobility    Modified Rankin (Stroke Patients Only)       Balance Overall balance assessment: Needs assistance Sitting-balance support: Bilateral upper extremity supported Sitting balance-Leahy Scale: Good     Standing balance support: Bilateral upper extremity supported Standing balance-Leahy Scale: Fair                               Pertinent Vitals/Pain Pain Assessment: No/denies pain    Home Living Family/patient expects to be discharged to:: Assisted living               Home Equipment: Walker - standard;Cane - single point;Bedside commode      Prior Function Level of Independence: Independent with assistive device(s)         Comments: Patient has been ambulating with RW at her assistive living facility.      Hand Dominance   Dominant Hand: Right    Extremity/Trunk Assessment   Upper Extremity Assessment Upper Extremity Assessment: Overall WFL for tasks assessed    Lower Extremity Assessment Lower Extremity Assessment: Overall WFL for tasks assessed       Communication   Communication: No difficulties  Cognition Arousal/Alertness: Awake/alert Behavior During Therapy: WFL for tasks assessed/performed Overall Cognitive Status: Within Functional Limits for tasks assessed (Age appropriate cognitive deficits)  General Comments      Exercises     Assessment/Plan    PT Assessment Patient needs continued PT services  PT Problem List Decreased strength;Decreased mobility;Decreased safety awareness;Decreased cognition;Decreased activity tolerance;Decreased balance;Decreased knowledge of use of DME;Cardiopulmonary status limiting activity       PT Treatment Interventions DME instruction;Therapeutic activities;Therapeutic exercise;Gait training;Stair  training;Balance training;Functional mobility training    PT Goals (Current goals can be found in the Care Plan section)  Acute Rehab PT Goals Patient Stated Goal: To return home  PT Goal Formulation: With patient Time For Goal Achievement: 02/28/17 Potential to Achieve Goals: Good    Frequency Min 2X/week   Barriers to discharge        Co-evaluation               AM-PAC PT "6 Clicks" Daily Activity  Outcome Measure Difficulty turning over in bed (including adjusting bedclothes, sheets and blankets)?: A Little Difficulty moving from lying on back to sitting on the side of the bed? : A Little Difficulty sitting down on and standing up from a chair with arms (e.g., wheelchair, bedside commode, etc,.)?: None Help needed moving to and from a bed to chair (including a wheelchair)?: A Little Help needed walking in hospital room?: A Little Help needed climbing 3-5 steps with a railing? : A Little 6 Click Score: 19    End of Session Equipment Utilized During Treatment: Gait belt Activity Tolerance: Patient tolerated treatment well Patient left: in chair;with chair alarm set;with call bell/phone within reach Nurse Communication: Mobility status PT Visit Diagnosis: Repeated falls (R29.6)    Time: 2423-5361 PT Time Calculation (min) (ACUTE ONLY): 15 min   Charges:   PT Evaluation $PT Eval Moderate Complexity: 1 Procedure     PT G Codes:   PT G-Codes **NOT FOR INPATIENT CLASS** Functional Assessment Tool Used: AM-PAC 6 Clicks Basic Mobility Functional Limitation: Mobility: Walking and moving around Mobility: Walking and Moving Around Current Status (W4315): At least 1 percent but less than 20 percent impaired, limited or restricted Mobility: Walking and Moving Around Goal Status [phone]): At least 1 percent but less than 20 percent impaired, limited or restricted   Royce Macadamia PT, DPT, CSCS    02/14/2017, 1:28 PM

## 2017-02-14 NOTE — Progress Notes (Signed)
A& O. Ambulated around nurses station and tolerated it well. Room air. NSR. Takes meds ok. IV and tele removed. D/c Packet given to family. Family to take to [facility].

## 2017-02-14 NOTE — Discharge Summary (Signed)
Tammie Mcmahon    MR#:  [id number]  DATE OF BIRTH:  10-Jan-1928  DATE OF ADMISSION:  02/13/2017 ADMITTING PHYSICIAN: Dustin Flock, MD  DATE OF DISCHARGE: 02/14/2017  PRIMARY CARE PHYSICIAN: Kirk Ruths, MD   ADMISSION DIAGNOSIS:  Hypokalemia [E87.6] Prolonged Q-T interval on ECG [R94.31] Fall, initial encounter [W19.XXXA]  DISCHARGE DIAGNOSIS:  Active Problems:   Fall   SECONDARY DIAGNOSIS:   Past Medical History:  Diagnosis Date  . (HFpEF) heart failure with preserved ejection fraction ([place])   . Atrial fibrillation and flutter ([place])   . Breast cancer ([place])   . CKD (chronic kidney disease), stage III   . Diabetes mellitus without complication ([place])   . Hyperlipidemia   . Hypertension   . Low back pain   . Menopausal and postmenopausal disorder   . PA (pernicious anemia)   . Thyroid disease   . Valvular heart disease      ADMITTING HISTORY  HISTORY OF PRESENT ILLNESS: Tammie Mcmahon  is a 83 y.o. female with a known history of Congestive heart failure, atrial fibrillation, breast cancer, chronic kidney disease, diabetes type 2, essential hypertension, hyperlipidemia, low back pain. Anemia hypothyroidism who actually was seen by her cardiologist recently and placed on a Holter monitor. Patient lives in assisted facility. And had a fall. More to the emergency room. In the emergency room was noted to have hypokalemia. And had some EKG changes the emergency room  doctor discussed the case with the cardiologist who recommended observation. Patient denies any chest pain or shortness of breath.   HOSPITAL COURSE:   * Hypokalemia associated EKG changes. Patient was aggressively treated with oral and IV replacement. By day of discharge her potassium is at 2.8. No arrhythmias on telemetry.  * Mechanical fall. No pain or fractures.  * Sinus bradycardia. Patient has been on amiodarone. Recently seen by her  cardiologist Dr. Nehemiah Massed reduce the dose of amiodarone. Her bradycardia is asymptomatic. Dr. Clayborn Bigness with cardiology has seen her here. I discussed the case with him. Advised to stop amiodarone and have patient follow-up in 1 week. Heartrate in the high 40s at discharge.  * Hypertension. Start lisinopril. Should help with hypokalemia.  Patient is stable for discharge back to her assisted living facility.  CONSULTS OBTAINED:  Treatment Team:  Yolonda Kida, MD  DRUG ALLERGIES:  No Known Allergies  DISCHARGE MEDICATIONS:   Current Discharge Medication List    START taking these medications   Details  lisinopril (PRINIVIL,ZESTRIL) 5 MG tablet Take 1 tablet (5 mg total) by mouth daily. Qty: 30 tablet, Refills: 0      CONTINUE these medications which have NOT CHANGED   Details  cholecalciferol (VITAMIN D) 1000 units tablet Take 1,000 Units by mouth daily.    DULoxetine (CYMBALTA) 60 MG capsule Take 60 mg by mouth daily.     !! levothyroxine (SYNTHROID, LEVOTHROID) 100 MCG tablet Take 1 tablet by mouth daily.    omeprazole (PRILOSEC) 20 MG capsule Take 20 mg by mouth daily.    polyethylene glycol (MIRALAX / GLYCOLAX) packet Take 17 g by mouth daily as needed for moderate constipation. Qty: 14 each, Refills: 0    potassium chloride (MICRO-K) 10 MEQ CR capsule Take 10 mEq by mouth daily.     pravastatin (PRAVACHOL) 40 MG tablet Take 40 mg by mouth at bedtime.     senna (SENOKOT) 8.6 MG TABS tablet Take 1 tablet (8.6 mg  total) by mouth 2 (two) times daily. Qty: 120 each, Refills: 0    torsemide (DEMADEX) 20 MG tablet Take 40 mg by mouth daily. Pt takes 2 tabs orally daily.    acetaminophen (TYLENOL) 325 MG tablet Take 650 mg by mouth every 6 (six) hours as needed. Pt takes 2 tabs orally as needed for pain.    azelastine (ASTELIN) 0.1 % nasal spray Place 1 spray into both nostrils daily as needed for allergies.  Refills: 0    benzonatate (TESSALON) 200 MG capsule Take 1  capsule by mouth 3 (three) times daily as needed for cough.  Refills: 0    diclofenac sodium (VOLTAREN) 1 % GEL Apply topically 4 (four) times daily as needed.     !! levothyroxine (SYNTHROID, LEVOTHROID) 75 MCG tablet Take 1 tablet (75 mcg total) by mouth daily before breakfast. Qty: 30 tablet, Refills: 0     !! - Potential duplicate medications found. Please discuss with provider.    STOP taking these medications     amiodarone (PACERONE) 200 MG tablet         Today   VITAL SIGNS:  Blood pressure (!) 153/45, pulse (!) 41, temperature 98.3 F (36.8 C), temperature source Oral, resp. rate 19, height 5\' 4"  (1.626 m), weight 72.6 kg (160 lb), SpO2 97 %.  I/O:   Intake/Output Summary (Last 24 hours) at 02/14/17 1301 Last data filed at 02/14/17 1100  Gross per 24 hour  Intake                0 ml  Output              201 ml  Net             -201 ml    PHYSICAL EXAMINATION:  Physical Exam  GENERAL:  83 y.o.-year-old patient lying in the bed with no acute distress.  LUNGS: Normal breath sounds bilaterally, no wheezing, rales,rhonchi or crepitation. No use of accessory muscles of respiration.  CARDIOVASCULAR: S1, S2 normal. No murmurs, rubs, or gallops.  ABDOMEN: Soft, non-tender, non-distended. Bowel sounds present. No organomegaly or mass.  NEUROLOGIC: Moves all 4 extremities. PSYCHIATRIC: The patient is alert and oriented x 3.  SKIN: No obvious rash, lesion, or ulcer.   DATA REVIEW:   CBC  Recent Labs Lab 02/14/17 0614  WBC 8.3  HGB 10.8*  HCT 32.1*  PLT 290    Chemistries   Recent Labs Lab 02/13/17 1004 02/14/17 0614  NA 135 140  K 2.9* 3.8  CL 98* 105  CO2 28 29  GLUCOSE 140* 111*  BUN 11 11  CREATININE 1.11* 1.15*  CALCIUM 8.6* 8.5*  MG 1.9  1.9  --     Cardiac Enzymes  Recent Labs Lab 02/13/17 1004  TROPONINI <0.03    Microbiology Results  Results for orders placed or performed during the hospital encounter of 01/04/17  Urine culture      Status: None   Collection Time: 01/04/17  4:15 PM  Result Value Ref Range Status   Specimen Description URINE, CATHETERIZED  Final   Special Requests NONE  Final   Culture   Final    NO GROWTH Performed at [facility], [place] [address] [place], [place] [address]    Report Status 01/06/2017 FINAL  Final    RADIOLOGY:  No results found.  Follow up with PCP in 1 week.  Management plans discussed with the patient, family and they are in agreement.  CODE  STATUS:     Code Status Orders        Start     Ordered   02/13/17 1322  Full code  Continuous     02/13/17 1321    Code Status History    Date Active Date Inactive Code Status Order ID Comments User Context   01/04/2017  4:14 PM 01/07/2017  7:51 PM Full Code [id number]  Theodoro Grist, MD Inpatient   10/22/2016  2:28 AM 10/23/2016  6:36 PM Full Code [id number]  Lance Coon, MD ED   10/16/2016  3:47 PM 10/18/2016  3:25 PM Full Code [id number]  Theodoro Grist, MD ED   09/10/2016  5:07 AM 09/15/2016  6:26 PM Full Code [id number]  Harrie Foreman, MD Inpatient    Advance Directive Documentation     Most Recent Value  Type of Advance Directive  Healthcare Power of Attorney  Pre-existing out of facility DNR order (yellow form or pink MOST form)  -  "MOST" Form in Place?  -      TOTAL TIME TAKING CARE OF THIS PATIENT ON DAY OF DISCHARGE: more than 30 minutes.   Hillary Bow R M.D on 02/14/2017 at 1:01 PM  Between 7am to 6pm - Pager - [phone]  After 6pm go to [email or web address] - password [email or web address] [facility]  Office  [phone]  CC: Primary care physician; Kirk Ruths, MD  Note: This dictation was prepared with Dragon dictation along with smaller phrase technology. Any transcriptional errors that result from this process are unintentional.

## 2017-04-23 ENCOUNTER — Encounter: Payer: Self-pay | Admitting: Emergency Medicine

## 2017-04-23 ENCOUNTER — Emergency Department
Admission: EM | Admit: 2017-04-23 | Discharge: 2017-04-23 | Disposition: A | Discharge status: Skilled Nursing Facility | Payer: Medicare Other | Attending: Emergency Medicine | Admitting: Emergency Medicine

## 2017-04-23 ENCOUNTER — Emergency Department: Payer: Medicare Other

## 2017-04-23 DIAGNOSIS — N183 Chronic kidney disease, stage 3 (moderate): Secondary | ICD-10-CM | POA: Insufficient documentation

## 2017-04-23 DIAGNOSIS — N990 Postprocedural (acute) (chronic) kidney failure: Secondary | ICD-10-CM | POA: Diagnosis not present

## 2017-04-23 DIAGNOSIS — E079 Disorder of thyroid, unspecified: Secondary | ICD-10-CM | POA: Diagnosis not present

## 2017-04-23 DIAGNOSIS — Z7902 Long term (current) use of antithrombotics/antiplatelets: Secondary | ICD-10-CM | POA: Insufficient documentation

## 2017-04-23 DIAGNOSIS — R51 Headache: Secondary | ICD-10-CM | POA: Insufficient documentation

## 2017-04-23 DIAGNOSIS — W0110XA Fall on same level from slipping, tripping and stumbling with subsequent striking against unspecified object, initial encounter: Secondary | ICD-10-CM | POA: Insufficient documentation

## 2017-04-23 DIAGNOSIS — Y92190 Kitchen in other specified residential institution as the place of occurrence of the external cause: Secondary | ICD-10-CM | POA: Diagnosis not present

## 2017-04-23 DIAGNOSIS — I129 Hypertensive chronic kidney disease with stage 1 through stage 4 chronic kidney disease, or unspecified chronic kidney disease: Secondary | ICD-10-CM | POA: Insufficient documentation

## 2017-04-23 DIAGNOSIS — Z043 Encounter for examination and observation following other accident: Secondary | ICD-10-CM | POA: Insufficient documentation

## 2017-04-23 DIAGNOSIS — I4892 Unspecified atrial flutter: Secondary | ICD-10-CM | POA: Diagnosis not present

## 2017-04-23 DIAGNOSIS — Z87891 Personal history of nicotine dependence: Secondary | ICD-10-CM | POA: Diagnosis not present

## 2017-04-23 DIAGNOSIS — E119 Type 2 diabetes mellitus without complications: Secondary | ICD-10-CM | POA: Insufficient documentation

## 2017-04-23 DIAGNOSIS — Y998 Other external cause status: Secondary | ICD-10-CM | POA: Insufficient documentation

## 2017-04-23 DIAGNOSIS — Y9301 Activity, walking, marching and hiking: Secondary | ICD-10-CM | POA: Insufficient documentation

## 2017-04-23 DIAGNOSIS — I4891 Unspecified atrial fibrillation: Secondary | ICD-10-CM | POA: Insufficient documentation

## 2017-04-23 DIAGNOSIS — W19XXXA Unspecified fall, initial encounter: Secondary | ICD-10-CM

## 2017-04-23 NOTE — ED Provider Notes (Signed)
[facility] Emergency Department Provider Note  ____________________________________________   None    (approximate)  I have reviewed the triage vital signs and the nursing notes.   HISTORY  Chief Complaint Fall    HPI Tammie Mcmahon is a 83 y.o. female who comes to the emergency department via EMS after mechanical fall. She lives at an assisted living facility and today was walking in her kitchen when she began to walk backward slipped and fell onto her bottom and then fell backwards and struck the back of her head. She is taking Coumadin. She denies loss of consciousness. She has mild severity non-throbbing headache. She denies numbness or weakness. She denies chest pain shortness of breath abdominal pain.   Past Medical History:  Diagnosis Date  . (HFpEF) heart failure with preserved ejection fraction ([place])   . Atrial fibrillation and flutter ([place])   . Breast cancer ([place])   . CKD (chronic kidney disease), stage III   . Diabetes mellitus without complication ([place])   . Hyperlipidemia   . Hypertension   . Low back pain   . Menopausal and postmenopausal disorder   . PA (pernicious anemia)   . Thyroid disease   . Valvular heart disease     Patient Active Problem List   Diagnosis Date Noted  . Fall 02/13/2017  . Syncope 01/04/2017  . Lower abdominal pain 01/04/2017  . CKD (chronic kidney disease), stage III 01/04/2017  . Elevated troponin 01/04/2017  . Hypokalemia 01/04/2017  . Leukocytosis 01/04/2017  . Dysuria 01/04/2017  . Constipation 01/04/2017  . Symptomatic bradycardia 10/22/2016  . Atrial fibrillation ([place]) 10/22/2016  . Heart failure with preserved ejection fraction ([place]) 10/22/2016  . Hypertension 10/22/2016  . Hyperlipidemia 10/22/2016  . Acute on chronic renal failure ([place]) 10/22/2016  . Other specified cardiac arrhythmias (CODE) 10/16/2016  . Confusion 09/10/2016    Past Surgical History:  Procedure Laterality  Date  . BREAST SURGERY    . GALLBLADDER SURGERY    . MASTECTOMY Left   . THYROIDECTOMY      Prior to Admission medications   Medication Sig Start Date End Date Taking? Authorizing Provider  acetaminophen (TYLENOL) 325 MG tablet Take 650 mg by mouth every 6 (six) hours as needed. Pt takes 2 tabs orally as needed for pain.    [provider]  azelastine (ASTELIN) 0.1 % nasal spray Place 1 spray into both nostrils daily as needed for allergies.  09/30/16   [provider]  benzonatate (TESSALON) 200 MG capsule Take 1 capsule by mouth 3 (three) times daily as needed for cough.  09/30/16   [provider]  cholecalciferol (VITAMIN D) 1000 units tablet Take 1,000 Units by mouth daily.    [provider]  diclofenac sodium (VOLTAREN) 1 % GEL Apply topically 4 (four) times daily as needed.     [provider]  DULoxetine (CYMBALTA) 60 MG capsule Take 60 mg by mouth daily.     [provider]  levothyroxine (SYNTHROID, LEVOTHROID) 100 MCG tablet Take 1 tablet by mouth daily. 02/04/17   [provider]  levothyroxine (SYNTHROID, LEVOTHROID) 75 MCG tablet Take 1 tablet (75 mcg total) by mouth daily before breakfast. Patient not taking: Reported on 02/13/2017 10/24/16   Max Sane, MD  lisinopril (PRINIVIL,ZESTRIL) 5 MG tablet Take 1 tablet (5 mg total) by mouth daily. 02/14/17   Hillary Bow, MD  omeprazole (PRILOSEC) 20 MG capsule Take 20 mg by mouth daily.    [provider]  polyethylene glycol (MIRALAX / GLYCOLAX) packet Take 17 g by mouth daily as needed for moderate constipation. 01/07/17   Max Sane, MD  potassium chloride (MICRO-K) 10 MEQ CR capsule Take 10 mEq by mouth daily.  04/10/14   [provider]  pravastatin (PRAVACHOL) 40 MG tablet Take 40 mg by mouth at bedtime.  05/19/14   [provider]  senna (SENOKOT) 8.6 MG TABS tablet Take 1 tablet (8.6 mg total) by mouth 2 (two) times daily. 01/07/17   Max Sane, MD  torsemide (DEMADEX) 20 MG tablet Take 40 mg by mouth daily. Pt takes 2 tabs orally daily.    [provider]    Allergies Patient has no known allergies.  Family History  Problem Relation Age of Onset  . Heart attack Father     Social History Social History  Substance Use Topics  . Smoking status: Never Smoker  . Smokeless tobacco: Former Systems developer    Types: Snuff  . Alcohol use No    Review of Systems Constitutional: No fever/chills ENT: No sore throat. Cardiovascular: Denies chest pain. Respiratory: Denies shortness of breath. Gastrointestinal: No abdominal pain.  No nausea, no vomiting.  No diarrhea.  No constipation. Musculoskeletal: Negative for back pain. Neurological: Positive for headaches   ____________________________________________   PHYSICAL EXAM:  VITAL SIGNS: ED Triage Vitals  Enc Vitals Group     BP 04/23/17 1918 (!) 184/99     Pulse --      Resp 04/23/17 1918 18     Temp 04/23/17 1918 98.4 F (36.9 C)     Temp Source 04/23/17 1918 Oral     SpO2 04/23/17 1918 97 %     Weight 04/23/17 1911 147 lb 11.3 oz (67 kg)     Height 04/23/17 1911 5\' 4"  (1.626 m)     Head Circumference --      Peak Flow --      Pain Score 04/23/17 1910 8     Pain Loc --      Pain Edu? --      Excl. in Register? --     Constitutional: Pleasant cooperative speaks in full clear sentences Head: Atraumatic. Nose: No congestion/rhinnorhea. Mouth/Throat: No trismus Neck: No stridor.   Cardiovascular: Regular rate and rhythm Respiratory: Normal respiratory effort.  No retractions. Gastrointestinal: Soft nontender Neurologic:  Normal speech and language. No gross focal neurologic deficits are appreciated.  Skin:  Skin is warm, dry and intact. No rash noted.    ____________________________________________  LABS (all labs ordered are listed, but only abnormal results are displayed)  Labs Reviewed - No data to  display   __________________________________________  EKG  ED ECG REPORT I, Darel Hong, the attending physician, personally viewed and interpreted this ECG.  Date: 04/23/2017 EKG Time:  Rate: 99 Rhythm: normal sinus rhythm QRS Axis: normal Intervals: The machine reads prolonged QTC however I disagree ST/T Wave abnormalities: normal Narrative Interpretation: Patient has a sinus rhythm with intermittent sinus pause. No high-grade blocks. No signs of acute ischemia abnormal  ____________________________________________  RADIOLOGY  Head CT with no acute disease ____________________________________________   PROCEDURES  Procedure(s) performed: no  Procedures  Critical Care performed: no  Observation: no ____________________________________________   INITIAL IMPRESSION / ASSESSMENT AND PLAN / ED COURSE  Pertinent labs & imaging results that were available during my care of the patient were reviewed by me and considered in my medical decision making (see chart for details).  The patient arrives well-appearing and  neurologically intact. She had a very minor trauma but given her advanced age and blood thinners I obtained a head CT which fortunately is negative. She'll be discharged home in good condition.      ____________________________________________   FINAL CLINICAL IMPRESSION(S) / ED DIAGNOSES  Final diagnoses:  Fall, initial encounter      NEW MEDICATIONS STARTED DURING THIS VISIT:  Discharge Medication List as of 04/23/2017  9:29 PM       Note:  This document was prepared using Dragon voice recognition software and may include unintentional dictation errors.      Darel Hong, MD 04/24/17 [phone]

## 2017-04-23 NOTE — ED Notes (Signed)
Pt given graham crackers and milk upon request.  Pt sitting on side of bed, having snack at this time.

## 2017-04-23 NOTE — ED Notes (Signed)
Patient transported to CT 

## 2017-04-23 NOTE — ED Triage Notes (Signed)
Pt in via EMS from [facility] with unwitnessed fall, reports hitting head on floor, denies LOC.  Pt reports taking Coumadin, however this medicatio is not listed on paper work sent via facility.  Pt A/Ox4, NAD noted at this time.

## 2017-04-23 NOTE — ED Notes (Signed)
Pt sundowning, alert to self only.  Unable to sign for discharge.  Pt transported back to [facility] via [facility] at this time.

## 2017-04-23 NOTE — Discharge Instructions (Signed)
Please follow-up with your primary care physician as needed and return to the emergency department for any concerns.  It was a pleasure to take care of you today, and thank you for coming to our emergency department.  If you have any questions or concerns before leaving please ask the nurse to grab me and I'm more than happy to go through your aftercare instructions again.  If you were prescribed any opioid pain medication today such as Norco, Vicodin, Percocet, morphine, hydrocodone, or oxycodone please make sure you do not drive when you are taking this medication as it can alter your ability to drive safely.  If you have any concerns once you are home that you are not improving or are in fact getting worse before you can make it to your follow-up appointment, please do not hesitate to call [phone] and come back for further evaluation.  Darel Hong MD  Results for orders placed or performed during the hospital encounter of 02/13/17  CBC  Result Value Ref Range   WBC 9.2 3.6 - 11.0 K/uL   RBC 3.56 (L) 3.80 - 5.20 MIL/uL   Hemoglobin 11.7 (L) 12.0 - 16.0 g/dL   HCT 34.5 (L) 35.0 - 47.0 %   MCV 96.8 80.0 - 100.0 fL   MCH 32.8 26.0 - 34.0 pg   MCHC 33.9 32.0 - 36.0 g/dL   RDW 18.4 (H) 11.5 - 14.5 %   Platelets 320 150 - 440 K/uL  Basic metabolic panel  Result Value Ref Range   Sodium 135 135 - 145 mmol/L   Potassium 2.9 (L) 3.5 - 5.1 mmol/L   Chloride 98 (L) 101 - 111 mmol/L   CO2 28 22 - 32 mmol/L   Glucose, Bld 140 (H) 65 - 99 mg/dL   BUN 11 6 - 20 mg/dL   Creatinine, Ser 1.11 (H) 0.44 - 1.00 mg/dL   Calcium 8.6 (L) 8.9 - 10.3 mg/dL   GFR calc non Af Amer 44 (L) >60 mL/min   GFR calc Af Amer 51 (L) >60 mL/min   Anion gap 9 5 - 15  Magnesium  Result Value Ref Range   Magnesium 1.9 1.7 - 2.4 mg/dL  Troponin I  Result Value Ref Range   Troponin I <0.03 <0.03 ng/mL  Urinalysis, Complete w Microscopic  Result Value Ref Range   Color, Urine YELLOW (A) YELLOW   APPearance CLEAR  (A) CLEAR   Specific Gravity, Urine 1.006 1.005 - 1.030   pH 6.0 5.0 - 8.0   Glucose, UA NEGATIVE NEGATIVE mg/dL   Hgb urine dipstick NEGATIVE NEGATIVE   Bilirubin Urine NEGATIVE NEGATIVE   Ketones, ur NEGATIVE NEGATIVE mg/dL   Protein, ur NEGATIVE NEGATIVE mg/dL   Nitrite NEGATIVE NEGATIVE   Leukocytes, UA TRACE (A) NEGATIVE   RBC / HPF NONE SEEN 0 - 5 RBC/hpf   WBC, UA 6-30 0 - 5 WBC/hpf   Bacteria, UA NONE SEEN NONE SEEN   Squamous Epithelial / LPF NONE SEEN NONE SEEN   Mucous PRESENT    Hyaline Casts, UA PRESENT   Magnesium  Result Value Ref Range   Magnesium 1.9 1.7 - 2.4 mg/dL  Basic metabolic panel  Result Value Ref Range   Sodium 140 135 - 145 mmol/L   Potassium 3.8 3.5 - 5.1 mmol/L   Chloride 105 101 - 111 mmol/L   CO2 29 22 - 32 mmol/L   Glucose, Bld 111 (H) 65 - 99 mg/dL   BUN 11 6 - 20 mg/dL  Creatinine, Ser 1.15 (H) 0.44 - 1.00 mg/dL   Calcium 8.5 (L) 8.9 - 10.3 mg/dL   GFR calc non Af Amer 42 (L) >60 mL/min   GFR calc Af Amer 48 (L) >60 mL/min   Anion gap 6 5 - 15  CBC  Result Value Ref Range   WBC 8.3 3.6 - 11.0 K/uL   RBC 3.31 (L) 3.80 - 5.20 MIL/uL   Hemoglobin 10.8 (L) 12.0 - 16.0 g/dL   HCT 32.1 (L) 35.0 - 47.0 %   MCV 96.9 80.0 - 100.0 fL   MCH 32.7 26.0 - 34.0 pg   MCHC 33.7 32.0 - 36.0 g/dL   RDW 18.9 (H) 11.5 - 14.5 %   Platelets 290 150 - 440 K/uL  Glucose, capillary  Result Value Ref Range   Glucose-Capillary 116 (H) 65 - 99 mg/dL  Glucose, capillary  Result Value Ref Range   Glucose-Capillary 120 (H) 65 - 99 mg/dL  Glucose, capillary  Result Value Ref Range   Glucose-Capillary 118 (H) 65 - 99 mg/dL   Ct Head Wo Contrast  Result Date: 04/23/2017 CLINICAL DATA:  Unwitnessed fall, head injury, on Coumadin. EXAM: CT HEAD WITHOUT CONTRAST TECHNIQUE: Contiguous axial images were obtained from the base of the skull through the vertex without intravenous contrast. COMPARISON:  Head CT dated 01/04/2017. FINDINGS: Brain: There is mild  generalized age related parenchymal atrophy with commensurate dilatation of the ventricles and sulci. Chronic small vessel ischemic changes again noted within the bilateral periventricular and subcortical white matter regions. There is no mass, hemorrhage, edema or other evidence of acute parenchymal abnormality. No extra-axial hemorrhage. Vascular: There are chronic calcified atherosclerotic changes of the large vessels at the skull base. No unexpected hyperdense vessel. Skull: Normal. Negative for fracture or focal lesion. Sinuses/Orbits: No acute finding. Other: None. IMPRESSION: 1. No acute findings. No intracranial mass, hemorrhage or edema. No skull fracture. 2. Atrophy and chronic small vessel ischemic changes in the white matter. Electronically Signed   By: Franki Cabot M.D.   On: 04/23/2017 19:55

## 2017-05-20 ENCOUNTER — Emergency Department: Payer: Medicare Other

## 2017-05-20 ENCOUNTER — Emergency Department
Admission: EM | Admit: 2017-05-20 | Discharge: 2017-05-20 | Disposition: A | Discharge status: Skilled Nursing Facility | Payer: Medicare Other | Attending: Emergency Medicine | Admitting: Emergency Medicine

## 2017-05-20 DIAGNOSIS — R1031 Right lower quadrant pain: Secondary | ICD-10-CM

## 2017-05-20 DIAGNOSIS — Z853 Personal history of malignant neoplasm of breast: Secondary | ICD-10-CM | POA: Diagnosis not present

## 2017-05-20 DIAGNOSIS — I10 Essential (primary) hypertension: Secondary | ICD-10-CM

## 2017-05-20 DIAGNOSIS — R1032 Left lower quadrant pain: Secondary | ICD-10-CM | POA: Diagnosis present

## 2017-05-20 DIAGNOSIS — I130 Hypertensive heart and chronic kidney disease with heart failure and stage 1 through stage 4 chronic kidney disease, or unspecified chronic kidney disease: Secondary | ICD-10-CM | POA: Diagnosis not present

## 2017-05-20 DIAGNOSIS — Z9012 Acquired absence of left breast and nipple: Secondary | ICD-10-CM | POA: Diagnosis not present

## 2017-05-20 DIAGNOSIS — N183 Chronic kidney disease, stage 3 (moderate): Secondary | ICD-10-CM | POA: Insufficient documentation

## 2017-05-20 DIAGNOSIS — E1122 Type 2 diabetes mellitus with diabetic chronic kidney disease: Secondary | ICD-10-CM | POA: Diagnosis not present

## 2017-05-20 DIAGNOSIS — Z79899 Other long term (current) drug therapy: Secondary | ICD-10-CM | POA: Insufficient documentation

## 2017-05-20 DIAGNOSIS — R52 Pain, unspecified: Secondary | ICD-10-CM

## 2017-05-20 DIAGNOSIS — I5030 Unspecified diastolic (congestive) heart failure: Secondary | ICD-10-CM | POA: Insufficient documentation

## 2017-05-20 LAB — COMPREHENSIVE METABOLIC PANEL
ALT: 13 U/L — ABNORMAL LOW (ref 14–54)
AST: 28 U/L (ref 15–41)
Albumin: 3.9 g/dL (ref 3.5–5.0)
Alkaline Phosphatase: 70 U/L (ref 38–126)
Anion gap: 11 (ref 5–15)
BUN: 17 mg/dL (ref 6–20)
CHLORIDE: 99 mmol/L — AB (ref 101–111)
CO2: 25 mmol/L (ref 22–32)
Calcium: 9 mg/dL (ref 8.9–10.3)
Creatinine, Ser: 1.36 mg/dL — ABNORMAL HIGH (ref 0.44–1.00)
GFR calc Af Amer: 40 mL/min — ABNORMAL LOW (ref >60)
GFR, EST NON AFRICAN AMERICAN: 34 mL/min — AB (ref >60)
Glucose, Bld: 114 mg/dL — ABNORMAL HIGH (ref 65–99)
Potassium: 4 mmol/L (ref 3.5–5.1)
Sodium: 135 mmol/L (ref 135–145)
Total Bilirubin: 0.9 mg/dL (ref 0.3–1.2)
Total Protein: 6.9 g/dL (ref 6.5–8.1)

## 2017-05-20 LAB — URINALYSIS, COMPLETE (UACMP) WITH MICROSCOPIC
BACTERIA UA: NONE SEEN
Bilirubin Urine: NEGATIVE
GLUCOSE, UA: NEGATIVE mg/dL
Hgb urine dipstick: NEGATIVE
Ketones, ur: NEGATIVE mg/dL
Leukocytes, UA: NEGATIVE
Nitrite: NEGATIVE
PROTEIN: NEGATIVE mg/dL
RBC / HPF: NONE SEEN RBC/hpf (ref 0–5)
Specific Gravity, Urine: 1.012 (ref 1.005–1.030)
WBC UA: NONE SEEN WBC/hpf (ref 0–5)
pH: 5 (ref 5.0–8.0)

## 2017-05-20 LAB — CBC WITH DIFFERENTIAL/PLATELET
BASOS ABS: 0.1 10*3/uL (ref 0–0.1)
Basophils Relative: 1 %
Eosinophils Absolute: 0.2 10*3/uL (ref 0–0.7)
Eosinophils Relative: 2 %
HEMATOCRIT: 39.1 % (ref 35.0–47.0)
Hemoglobin: 13.1 g/dL (ref 12.0–16.0)
LYMPHS PCT: 18 %
Lymphs Abs: 1.7 10*3/uL (ref 1.0–3.6)
MCH: 31.4 pg (ref 26.0–34.0)
MCHC: 33.6 g/dL (ref 32.0–36.0)
MCV: 93.5 fL (ref 80.0–100.0)
Monocytes Absolute: 0.6 10*3/uL (ref 0.2–0.9)
Monocytes Relative: 6 %
NEUTROS ABS: 7.2 10*3/uL — AB (ref 1.4–6.5)
NEUTROS PCT: 73 %
Platelets: 312 10*3/uL (ref 150–440)
RBC: 4.18 MIL/uL (ref 3.80–5.20)
RDW: 15.5 % — ABNORMAL HIGH (ref 11.5–14.5)
WBC: 9.8 10*3/uL (ref 3.6–11.0)

## 2017-05-20 LAB — PROTIME-INR
INR: 0.96
Prothrombin Time: 12.8 seconds (ref 11.4–15.2)

## 2017-05-20 LAB — TROPONIN I: Troponin I: <0.03 ng/mL (ref <0.03)

## 2017-05-20 MED ORDER — IOPAMIDOL (ISOVUE-300) INJECTION 61%
75.0000 mL | Freq: Once | INTRAVENOUS | Status: AC | PRN
Start: 2017-05-20 — End: 2017-05-20
  Administered 2017-05-20: 75 mL via INTRAVENOUS

## 2017-05-20 MED ORDER — IOPAMIDOL (ISOVUE-300) INJECTION 61%
30.0000 mL | Freq: Once | INTRAVENOUS | Status: AC | PRN
  Administered 2017-05-20: 30 mL via ORAL

## 2017-05-20 MED ORDER — LABETALOL HCL 5 MG/ML IV SOLN
5.0000 mg | Freq: Once | INTRAVENOUS | Status: AC
  Administered 2017-05-20: 5 mg via INTRAVENOUS
  Filled 2017-05-20: qty 4

## 2017-05-20 MED ORDER — SODIUM CHLORIDE 0.9 % IV BOLUS (SEPSIS)
500.0000 mL | Freq: Once | INTRAVENOUS | Status: AC
  Administered 2017-05-20: 500 mL via INTRAVENOUS

## 2017-05-20 NOTE — ED Notes (Signed)
Secretary notified to call ems to transport pt back to [facility]

## 2017-05-20 NOTE — ED Notes (Signed)
Assisted pt to toilet to void 

## 2017-05-20 NOTE — ED Notes (Signed)
CT notified that pt was finished with contrast

## 2017-05-20 NOTE — ED Notes (Signed)
Nephew arrived and decided to transport pt to [facility] canceled

## 2017-05-20 NOTE — Discharge Instructions (Signed)
Ms. Tammie Mcmahon ED workup was unremarkable.  UA, basic labs, CT abd were all negative except for stool burden on CT.  She also had some hypertension and tachycardia but no concerning EKG findings, and was controlled with labetalol.    She is stable for discharge back to her facility and should follow with her primary Dr. Ouida Sills.

## 2017-05-20 NOTE — ED Notes (Signed)
Charge notified that safety sitter was needed because pt was climbing out the ned of the bed and stating that she was going to walk home - pt states her mother is worried about her and that her family does not know where she is - tried to reassure pt but she is becoming increasingly agitated and combative - [facility] ER Tech is now sitting with pt for her safety

## 2017-05-20 NOTE — ED Provider Notes (Signed)
[facility] Emergency Department Provider Note ____________________________________________   First MD Initiated Contact with Patient 05/20/17 1350     (approximate)  I have reviewed the triage vital signs and the nursing notes.   HISTORY  Chief Complaint Groin Pain    HPI Wrenly Lauritsen Bares is a 83 y.o. female Who presents from nursing home with right groin and right lower quadrant abdominal pain for several hours, acute onset when she awoke this morning. Patient reports associated urinary frequency and dysuria. No associated fever or chills, no weakness, no nausea vomiting or diarrhea. No prior history of this pain. Patient denies any trauma recently.  Past Medical History:  Diagnosis Date  . (HFpEF) heart failure with preserved ejection fraction ([place])   . Atrial fibrillation and flutter ([place])   . Breast cancer ([place])   . CKD (chronic kidney disease), stage III   . Diabetes mellitus without complication ([place])   . Hyperlipidemia   . Hypertension   . Low back pain   . Menopausal and postmenopausal disorder   . PA (pernicious anemia)   . Thyroid disease   . Valvular heart disease     Patient Active Problem List   Diagnosis Date Noted  . Fall 02/13/2017  . Syncope 01/04/2017  . Lower abdominal pain 01/04/2017  . CKD (chronic kidney disease), stage III 01/04/2017  . Elevated troponin 01/04/2017  . Hypokalemia 01/04/2017  . Leukocytosis 01/04/2017  . Dysuria 01/04/2017  . Constipation 01/04/2017  . Symptomatic bradycardia 10/22/2016  . Atrial fibrillation ([place]) 10/22/2016  . Heart failure with preserved ejection fraction ([place]) 10/22/2016  . Hypertension 10/22/2016  . Hyperlipidemia 10/22/2016  . Acute on chronic renal failure ([place]) 10/22/2016  . Other specified cardiac arrhythmias (CODE) 10/16/2016  . Confusion 09/10/2016    Past Surgical History:  Procedure Laterality Date  . BREAST SURGERY    . GALLBLADDER SURGERY    . MASTECTOMY  Left   . THYROIDECTOMY      Prior to Admission medications   Medication Sig Start Date End Date Taking? Authorizing Provider  acetaminophen (TYLENOL) 325 MG tablet Take 650 mg by mouth every 6 (six) hours as needed. Pt takes 2 tabs orally as needed for pain.    [provider]  azelastine (ASTELIN) 0.1 % nasal spray Place 1 spray into both nostrils daily as needed for allergies.  09/30/16   [provider]  benzonatate (TESSALON) 200 MG capsule Take 1 capsule by mouth 3 (three) times daily as needed for cough.  09/30/16   [provider]  cholecalciferol (VITAMIN D) 1000 units tablet Take 1,000 Units by mouth daily.    [provider]  diclofenac sodium (VOLTAREN) 1 % GEL Apply topically 4 (four) times daily as needed.     [provider]  DULoxetine (CYMBALTA) 60 MG capsule Take 60 mg by mouth daily.     [provider]  levothyroxine (SYNTHROID, LEVOTHROID) 100 MCG tablet Take 1 tablet by mouth daily. 02/04/17   [provider]  levothyroxine (SYNTHROID, LEVOTHROID) 75 MCG tablet Take 1 tablet (75 mcg total) by mouth daily before breakfast. Patient not taking: Reported on 02/13/2017 10/24/16   Max Sane, MD  lisinopril (PRINIVIL,ZESTRIL) 5 MG tablet Take 1 tablet (5 mg total) by mouth daily. 02/14/17   Hillary Bow, MD  omeprazole (PRILOSEC) 20 MG capsule Take 20 mg by mouth daily.    [provider]  polyethylene glycol (MIRALAX / GLYCOLAX) packet Take 17 g by mouth daily as  needed for moderate constipation. 01/07/17   Max Sane, MD  potassium chloride (MICRO-K) 10 MEQ CR capsule Take 10 mEq by mouth daily.  04/10/14   [provider]  pravastatin (PRAVACHOL) 40 MG tablet Take 40 mg by mouth at bedtime.  05/19/14   [provider]  senna (SENOKOT) 8.6 MG TABS tablet Take 1 tablet (8.6 mg total) by mouth 2 (two) times daily. 01/07/17   Max Sane, MD  torsemide (DEMADEX) 20 MG tablet Take 40 mg by mouth daily.  Pt takes 2 tabs orally daily.    [provider]    Allergies Patient has no known allergies.  Family History  Problem Relation Age of Onset  . Heart attack Father     Social History Social History  Substance Use Topics  . Smoking status: Never Smoker  . Smokeless tobacco: Former Systems developer    Types: Snuff  . Alcohol use No    Review of Systems  Constitutional: No fever/chills Eyes: No visual changes. ENT: No sore throat. Cardiovascular: Denies chest pain. Respiratory: Denies shortness of breath. Gastrointestinal: No nausea, no vomiting.  No diarrhea. Positive for abdominal pain. Genitourinary: Positive for dysuria and frequency. Musculoskeletal: Negative for back pain. Skin: Negative for rash. Neurological: Negative for headaches, focal weakness or numbness.   ____________________________________________   PHYSICAL EXAM:  VITAL SIGNS: ED Triage Vitals [05/20/17 1345]  Enc Vitals Group     BP      Pulse      Resp      Temp      Temp src      SpO2 97 %     Weight 140 lb (63.5 kg)     Height 5\' 5"  (1.651 m)     Head Circumference      Peak Flow      Pain Score 8     Pain Loc      Pain Edu?      Excl. in [place]?     Constitutional: Alert and oriented. Well appearing and in no acute distress. Eyes: Conjunctivae are normal.  Head: Atraumatic. Nose: No congestion/rhinnorhea. Mouth/Throat: Dry mucous membranes.   Neck: Normal range of motion.  Cardiovascular: Normal rate, regular rhythm. Grossly normal heart sounds.  Good peripheral circulation. Respiratory: Normal respiratory effort.  No retractions. Lungs CTAB. Gastrointestinal: Soft with moderate tess right lower quadrant. No inguinal tenderness or lymphadenopathy.  No cutaneous findings. Genitourinary: No CVA tenderness. Musculoskeletal: No lower extremity edema.  Extremities warm and well perfused. Full range of motion at right hip with no pain.   Neurologic:  Normal speech and language. No gross  focal neurologic deficits are appreciated.  Skin:  Skin is warm and dry. No rash noted. Psychiatric: Mood and affect are normal. Speech and behavior are normal.  ____________________________________________   LABS (all labs ordered are listed, but only abnormal results are displayed)  Labs Reviewed  COMPREHENSIVE METABOLIC PANEL  URINALYSIS, COMPLETE (UACMP) WITH MICROSCOPIC  CBC WITH DIFFERENTIAL/PLATELET  TROPONIN I  PROTIME-INR   ____________________________________________  EKG  ED ECG REPORT I, Arta Silence, the attending physician, personally viewed and interpreted this ECG.  Date: 05/20/2017 EKG Time: 14:05 Rate: 105 Rhythm: sinus tachycardia QRS Axis: normal Intervals: prolonged QT ST/T Wave abnormalities: Nonspecific abnormalities Narrative Interpretation: no acute findings ____________________________________________  RADIOLOGY  Hip and pelvis x-rays negative for acute fracture.  CT abdomen with increased stool burden, but no acute findings.  ____________________________________________   PROCEDURES  Procedure(s) performed: No    Critical Care performed:  No ____________________________________________   INITIAL IMPRESSION / ASSESSMENT AND PLAN / ED COURSE  Pertinent labs & imaging results that were available during my care of the patient were reviewed by me and considered in my medical decision making (see chart for details).  7 field female past medical history as noted presents with right lower quadrant pain since she awoke this morning. Patient overall well appearing, vital signs are normal except for borderline tachycardia, and exam is as described; there is tenderness in the right lower quadrant but no right hip, inguinal, or groin pain and full range of motion of the right hip. Patient reports associated urinary symptoms. Overall suspect most likely UTI/cystitiversus colitis or other intra-abdominal cause. Due to patient's age and poor  mobility will obtain x-rays of hip and pelvis to r/o bony trauma. Plan for labs, UA, and CT abdomen, then reassess.     ----------------------------------------- 5:42 PM on 05/20/2017 -----------------------------------------  ED workup so far has been negative, including negative UA, CT with no acute findings except for moderate stool burden and negative hip films. Patient states the pain is imp appears comfortable however her heart rate has remained mildly tachycardic approximately 105-107 for the duration of her visit. Patient denies palpitations or chest pain; unclear cause of this mild tachycardia.  Will give small fluid bolus, and reassess, and will attempt to discuss with patient's PMD. However patient likely will be able to be discharged.  ----------------------------------------- 7:59 PM on 05/20/2017 -----------------------------------------  Case discussed with covering Dr. Theodoro Kalata who is aware of patient's ED course and states he will pass information to her PMD. On reassessment patient remained with heart rate approximately 105 and with somewhat worsened blood pressure. Patient is on antihypertensives but compliance is unclear. I gave low dose of labetalol and patient's heart rate is now in 80s and blood pressure improved. At this time there is no evidence for hypertensive emergency, no indication for further ED workup or for admission. Patient is becoming upset with being in the ED and would like to go back to her facility. Will discharge and send with transport by EMS.  ____________________________________________   FINAL CLINICAL IMPRESSION(S) / ED DIAGNOSES  Final diagnoses:  None      NEW MEDICATIONS STARTED DURING THIS VISIT:  New Prescriptions   No medications on file     Note:  This document was prepared using Dragon voice recognition software and may include unintentional dictation errors.    Arta Silence, MD 05/20/17 2203

## 2017-05-20 NOTE — ED Notes (Signed)
Pt assisted to toilet to void and have small loose stool - minimal assist required

## 2017-05-20 NOTE — ED Triage Notes (Signed)
Pt arrived via ems for c/o right groin and lower right abd pain that radiates into the back - pt is c/o difficulty with urination also

## 2017-06-23 ENCOUNTER — Emergency Department: Payer: Medicare Other

## 2017-06-23 ENCOUNTER — Emergency Department
Admission: EM | Admit: 2017-06-23 | Discharge: 2017-06-23 | Disposition: A | Discharge status: Home / Self Care | Payer: Medicare Other | Attending: Emergency Medicine | Admitting: Emergency Medicine

## 2017-06-23 DIAGNOSIS — Z79899 Other long term (current) drug therapy: Secondary | ICD-10-CM | POA: Insufficient documentation

## 2017-06-23 DIAGNOSIS — Z853 Personal history of malignant neoplasm of breast: Secondary | ICD-10-CM | POA: Diagnosis not present

## 2017-06-23 DIAGNOSIS — R1031 Right lower quadrant pain: Secondary | ICD-10-CM | POA: Insufficient documentation

## 2017-06-23 DIAGNOSIS — I130 Hypertensive heart and chronic kidney disease with heart failure and stage 1 through stage 4 chronic kidney disease, or unspecified chronic kidney disease: Secondary | ICD-10-CM | POA: Insufficient documentation

## 2017-06-23 DIAGNOSIS — K5900 Constipation, unspecified: Secondary | ICD-10-CM | POA: Insufficient documentation

## 2017-06-23 DIAGNOSIS — I509 Heart failure, unspecified: Secondary | ICD-10-CM | POA: Diagnosis not present

## 2017-06-23 DIAGNOSIS — N183 Chronic kidney disease, stage 3 (moderate): Secondary | ICD-10-CM | POA: Insufficient documentation

## 2017-06-23 DIAGNOSIS — Z87891 Personal history of nicotine dependence: Secondary | ICD-10-CM | POA: Diagnosis not present

## 2017-06-23 DIAGNOSIS — S22000A Wedge compression fracture of unspecified thoracic vertebra, initial encounter for closed fracture: Secondary | ICD-10-CM

## 2017-06-23 LAB — COMPREHENSIVE METABOLIC PANEL
ALK PHOS: 88 U/L (ref 38–126)
ALT: 15 U/L (ref 14–54)
AST: 32 U/L (ref 15–41)
Albumin: 3.9 g/dL (ref 3.5–5.0)
Anion gap: 11 (ref 5–15)
BILIRUBIN TOTAL: 1 mg/dL (ref 0.3–1.2)
BUN: 19 mg/dL (ref 6–20)
CALCIUM: 9 mg/dL (ref 8.9–10.3)
CO2: 28 mmol/L (ref 22–32)
CREATININE: 1.24 mg/dL — AB (ref 0.44–1.00)
Chloride: 98 mmol/L — ABNORMAL LOW (ref 101–111)
GFR calc non Af Amer: 38 mL/min — ABNORMAL LOW (ref >60)
GFR, EST AFRICAN AMERICAN: 44 mL/min — AB (ref >60)
Glucose, Bld: 106 mg/dL — ABNORMAL HIGH (ref 65–99)
Potassium: 4.2 mmol/L (ref 3.5–5.1)
Sodium: 137 mmol/L (ref 135–145)
Total Protein: 6.7 g/dL (ref 6.5–8.1)

## 2017-06-23 LAB — LACTIC ACID, PLASMA: LACTIC ACID, VENOUS: 1.4 mmol/L (ref 0.5–1.9)

## 2017-06-23 LAB — CBC
HCT: 38.9 % (ref 35.0–47.0)
Hemoglobin: 13.4 g/dL (ref 12.0–16.0)
MCH: 33.5 pg (ref 26.0–34.0)
MCHC: 34.3 g/dL (ref 32.0–36.0)
MCV: 97.7 fL (ref 80.0–100.0)
PLATELETS: 337 10*3/uL (ref 150–440)
RBC: 3.99 MIL/uL (ref 3.80–5.20)
RDW: 17.2 % — ABNORMAL HIGH (ref 11.5–14.5)
WBC: 7.7 10*3/uL (ref 3.6–11.0)

## 2017-06-23 LAB — LIPASE, BLOOD: Lipase: 15 U/L (ref 11–51)

## 2017-06-23 MED ORDER — SORBITOL 70 % SOLN
960.0000 mL | TOPICAL_OIL | Freq: Once | ORAL | Status: AC
  Administered 2017-06-23: 960 mL via RECTAL
  Filled 2017-06-23: qty 240

## 2017-06-23 MED ORDER — MORPHINE SULFATE (PF) 2 MG/ML IV SOLN
2.0000 mg | Freq: Once | INTRAVENOUS | Status: AC
  Administered 2017-06-23: 2 mg via INTRAVENOUS
  Filled 2017-06-23: qty 1

## 2017-06-23 MED ORDER — MAGNESIUM CITRATE PO SOLN
1.0000 | Freq: Once | ORAL | Status: AC
  Administered 2017-06-23: 1 via ORAL
  Filled 2017-06-23 (×2): qty 296

## 2017-06-23 MED ORDER — BISACODYL 5 MG PO TBEC
5.0000 mg | DELAYED_RELEASE_TABLET | Freq: Once | ORAL | Status: AC
  Administered 2017-06-23: 5 mg via ORAL
  Filled 2017-06-23: qty 1

## 2017-06-23 MED ORDER — IOPAMIDOL (ISOVUE-300) INJECTION 61%
75.0000 mL | Freq: Once | INTRAVENOUS | Status: AC | PRN
  Administered 2017-06-23: 75 mL via INTRAVENOUS
  Filled 2017-06-23: qty 75

## 2017-06-23 NOTE — ED Provider Notes (Signed)
[facility] Emergency Department Provider Note  ____________________________________________   First MD Initiated Contact with Patient 06/23/17 1518     (approximate)  I have reviewed the triage vital signs and the nursing notes.   HISTORY  Chief Complaint Abdominal Pain and Constipation    HPI Tammie Mcmahon is a 83 y.o. female who comes to the emergency department via EMS with roughly 1 week of moderate to severe intermittent right lower quadrant pain. She has not passed a bowel movement in 3 days. She does not know when she last passed flatus. She has no abdominal surgical history.  She has no numbness or weakness. She has not fallen. She has no dysuria frequency or hesitancy. She has mild aching low back pain but says this is chronic. She has no difficulty ambulating. Nothing in particular seems to make her abdominal pain better or worse. It is nonradiating.   Past Medical History:  Diagnosis Date  . (HFpEF) heart failure with preserved ejection fraction ([place])   . Atrial fibrillation and flutter ([place])   . Breast cancer ([place])   . CKD (chronic kidney disease), stage III   . Hyperlipidemia   . Hypertension   . Low back pain   . Menopausal and postmenopausal disorder   . PA (pernicious anemia)   . Thyroid disease   . Valvular heart disease     Patient Active Problem List   Diagnosis Date Noted  . Fall 02/13/2017  . Syncope 01/04/2017  . Lower abdominal pain 01/04/2017  . CKD (chronic kidney disease), stage III 01/04/2017  . Elevated troponin 01/04/2017  . Hypokalemia 01/04/2017  . Leukocytosis 01/04/2017  . Dysuria 01/04/2017  . Constipation 01/04/2017  . Symptomatic bradycardia 10/22/2016  . Atrial fibrillation ([place]) 10/22/2016  . Heart failure with preserved ejection fraction ([place]) 10/22/2016  . Hypertension 10/22/2016  . Hyperlipidemia 10/22/2016  . Acute on chronic renal failure ([place]) 10/22/2016  . Other specified cardiac  arrhythmias (CODE) 10/16/2016  . Confusion 09/10/2016    Past Surgical History:  Procedure Laterality Date  . BREAST SURGERY    . GALLBLADDER SURGERY    . MASTECTOMY Left   . THYROIDECTOMY      Prior to Admission medications   Medication Sig Start Date End Date Taking? Authorizing Provider  acetaminophen (TYLENOL) 325 MG tablet Take 650 mg by mouth every 6 (six) hours as needed. Pt takes 2 tabs orally as needed for pain.    [provider]  amiodarone (PACERONE) 200 MG tablet Take 200 mg by mouth daily.    [provider]  aspirin EC 81 MG tablet Take 81 mg by mouth daily.    [provider]  azelastine (ASTELIN) 0.1 % nasal spray Place 1 spray into both nostrils daily as needed for allergies.  09/30/16   [provider]  cholecalciferol (VITAMIN D) 1000 units tablet Take 1,000 Units by mouth daily.    [provider]  diclofenac sodium (VOLTAREN) 1 % GEL Apply topically 4 (four) times daily as needed.     [provider]  DULoxetine (CYMBALTA) 60 MG capsule Take 60 mg by mouth daily.     [provider]  levothyroxine (SYNTHROID, LEVOTHROID) 100 MCG tablet Take 1 tablet by mouth daily. 02/04/17   [provider]  levothyroxine (SYNTHROID, LEVOTHROID) 75 MCG tablet Take 1 tablet (75 mcg total) by mouth daily before breakfast. Patient not taking: Reported on 02/13/2017 10/24/16   Max Sane, MD  lisinopril (PRINIVIL,ZESTRIL) 5 MG  tablet Take 1 tablet (5 mg total) by mouth daily. 02/14/17   Hillary Bow, MD  omeprazole (PRILOSEC) 20 MG capsule Take 20 mg by mouth daily.    [provider]  polyethylene glycol (MIRALAX / GLYCOLAX) packet Take 17 g by mouth daily as needed for moderate constipation. 01/07/17   Max Sane, MD  potassium chloride (MICRO-K) 10 MEQ CR capsule Take 10 mEq by mouth daily.  04/10/14   [provider]  pravastatin (PRAVACHOL) 40 MG tablet Take 40 mg by mouth at bedtime.  05/19/14    [provider]  senna (SENOKOT) 8.6 MG TABS tablet Take 1 tablet (8.6 mg total) by mouth 2 (two) times daily. 01/07/17   Max Sane, MD  torsemide (DEMADEX) 20 MG tablet Take 40 mg by mouth daily. Pt takes 2 tabs orally daily.    [provider]    Allergies Patient has no known allergies.  Family History  Problem Relation Age of Onset  . Heart attack Father     Social History Social History  Substance Use Topics  . Smoking status: Never Smoker  . Smokeless tobacco: Former Systems developer    Types: Snuff  . Alcohol use No    Review of Systems Constitutional: No fever/chills Eyes: No visual changes. ENT: No sore throat. Cardiovascular: Denies chest pain. Respiratory: Denies shortness of breath. Gastrointestinal: positive abdominal pain.  No nausea, no vomiting.  No diarrhea.  Positive constipation. Genitourinary: Negative for dysuria. Musculoskeletal: positive for back pain. Skin: Negative for rash. Neurological: Negative for headaches, focal weakness or numbness.   ____________________________________________   PHYSICAL EXAM:  VITAL SIGNS: ED Triage Vitals  Enc Vitals Group     BP 06/23/17 1248 (!) 118/105     Pulse Rate 06/23/17 1248 98     Resp 06/23/17 1248 18     Temp 06/23/17 1248 98.7 F (37.1 C)     Temp Source 06/23/17 1248 Oral     SpO2 06/23/17 1248 98 %     Weight 06/23/17 1249 140 lb (63.5 kg)     Height 06/23/17 1249 5\' 5"  (1.651 m)     Head Circumference --      Peak Flow --      Pain Score 06/23/17 1248 8     Pain Loc --      Pain Edu? --      Excl. in [place]? --     Constitutional: alert and oriented 4 somewhat hard of hearing but very well-appearing no diaphoresis Eyes: PERRL EOMI. Head: Atraumatic. Nose: No congestion/rhinnorhea. Mouth/Throat: No trismus Neck: No stridor.   Cardiovascular: Normal rate, regular rhythm. Grossly normal heart sounds.  Good peripheral circulation. Respiratory: Normal respiratory effort.  No  retractions. Lungs CTAB and moving good air Gastrointestinal: soft nondistended she is tender in her right lower quadrant with some rebound but no frank peritonitis negative Rovsing's Musculoskeletal: No lower extremity edema   no midline back tenderness Neurologic:  Normal speech and language. No gross focal neurologic deficits are appreciated. 5 out of 5 strength hip flexion and hip extension plantar flexion dorsiflexion Skin:  Skin is warm, dry and intact. No rash noted. Psychiatric: Mood and affect are normal. Speech and behavior are normal.    ____________________________________________   DIFFERENTIAL includes but not limited to  appendicitis, diverticulitis, small bowel obstruction, large bowel structure, volvulus, urinary tract infection, pyelonephritis, constipation ____________________________________________   LABS (all labs ordered are listed, but only abnormal results are displayed)  Labs Reviewed  COMPREHENSIVE METABOLIC PANEL -  Abnormal; Notable for the following:       Result Value   Chloride 98 (*)    Glucose, Bld 106 (*)    Creatinine, Ser 1.24 (*)    GFR calc non Af Amer 38 (*)    GFR calc Af Amer 44 (*)    All other components within normal limits  CBC - Abnormal; Notable for the following:    RDW 17.2 (*)    All other components within normal limits  LIPASE, BLOOD  LACTIC ACID, PLASMA  URINALYSIS, COMPLETE (UACMP) WITH MICROSCOPIC    blood work unremarkable __________________________________________  EKG   ____________________________________________  RADIOLOGY  CT scan reviewed by me shows no acute intra-abdominal pathology but does show a new T11 compression fracture ____________________________________________   PROCEDURES  Procedure(s) performed: no  Procedures  Critical Care performed: no  Observation: no ____________________________________________   INITIAL IMPRESSION / ASSESSMENT AND PLAN / ED COURSE  Pertinent labs &  imaging results that were available during my care of the patient were reviewed by me and considered in my medical decision making (see chart for details).   the patient arrives hemodynamically stable although uncomfortable appearing with significant right lower quadrant tenderness. Differential is broad but includes small bowel obstruction as well as appendicitis diverticulitis and volvulus. She requires CT scan with intravenous contrast now.     ----------------------------------------- 4:53 PM on 06/23/2017 ----------------------------------------- the patient's CT scan is back showing no acute intra-abdominal pathology but does note incidentally a T11 compression fracture. On further questioning the patient denies trauma. She remains neurologically intact. She does have significant constipation. Enema is pending as well as neurosurgery consultation. ____________________________________________  ----------------------------------------- 5:42 PM on 06/23/2017 -----------------------------------------  I discussed the case with on-call neurosurgeon Dr. Aris Lot who recommends a TLSO brace to be used while out of bed only. I've contacted the device company. He also recommended an MRI without contrast. The patient declines the MRI today stating she is more concerned about her constipation and she would prefer to have it done as an outpatient.  the patient received her TLSO brace and had an x-ray following which was stable. I again discussed the case with Dr. Aris Lot and explained that the patient does not want an MRI today. As she is neurologically intact if it is reasonable to discharge her home with follow-up this week. The patient verbalizes understanding that we recommend MRI today.  FINAL CLINICAL IMPRESSION(S) / ED DIAGNOSES  Final diagnoses:  Constipation, unspecified constipation type  Right lower quadrant abdominal pain  Compression fracture of body of thoracic vertebra (HCC)      NEW  MEDICATIONS STARTED DURING THIS VISIT:  Discharge Medication List as of 06/23/2017  7:44 PM       Note:  This document was prepared using Dragon voice recognition software and may include unintentional dictation errors.     Darel Hong, MD 06/23/17 [phone]

## 2017-06-23 NOTE — Consult Note (Signed)
I have reviewed the imaging in regards to the new compression fracture, which is new compared to August 2018 imaging. I do not see any lytic or blastic components and thus I am suspicious for benign osteoporotic compression fracture. The posterior elements appear intact and no significant de novo kyphosis. Her exam has been reported as intact and no evidence of canal compromise on imaging.  Recommend: MRI lumbar spine - image through T8 (w/o contrast) TLSO brace when OOB F/u in NSGY clinic in 1-2 weeks Baseline xrays in ER with brace in place  Era Bumpers, MD

## 2017-06-23 NOTE — ED Notes (Signed)
After sitting up and being cleaned up patient became nauseous.  MD aware.

## 2017-06-23 NOTE — ED Notes (Signed)
Pt arrives via EMS from [facility], states no BM for 5 days, was given meds at the facility but states she can not wait, vitals WDL

## 2017-06-23 NOTE — ED Notes (Signed)
Patient had approx. Amount of SMOG enema in brown liquid in bedside commode with one medium size stool.  Patient states feels some relief of pressure.

## 2017-06-23 NOTE — ED Triage Notes (Signed)
Pt c/p constipation and RLQ pain X 2 days. Denies NVD. Pt alert and oriented X4, active, cooperative, pt in NAD. RR even and unlabored, color WNL.

## 2017-06-23 NOTE — ED Notes (Signed)
Patient transported to CT 

## 2017-06-23 NOTE — ED Notes (Signed)
Pt sitting on bedside commode at this time.

## 2017-06-23 NOTE — ED Notes (Signed)
[employer] at bedside 

## 2017-06-23 NOTE — Discharge Instructions (Signed)
Please wear your brace at all times while you're out of bed, but do not wear the brace when you're in bed or in a chair.  Please call the neurosurgeon's office to make an appointment in a week or so for a recheck - they are expecting you.  Return to the ED for any concerns.  It was a pleasure to take care of you today, and thank you for coming to our emergency department.  If you have any questions or concerns before leaving please ask the nurse to grab me and I'm more than happy to go through your aftercare instructions again.  If you were prescribed any opioid pain medication today such as Norco, Vicodin, Percocet, morphine, hydrocodone, or oxycodone please make sure you do not drive when you are taking this medication as it can alter your ability to drive safely.  If you have any concerns once you are home that you are not improving or are in fact getting worse before you can make it to your follow-up appointment, please do not hesitate to call 911 and come back for further evaluation.  Darel Hong, MD  Results for orders placed or performed during the hospital encounter of 06/23/17  Lipase, blood  Result Value Ref Range   Lipase 15 11 - 51 U/L  Comprehensive metabolic panel  Result Value Ref Range   Sodium 137 135 - 145 mmol/L   Potassium 4.2 3.5 - 5.1 mmol/L   Chloride 98 (L) 101 - 111 mmol/L   CO2 28 22 - 32 mmol/L   Glucose, Bld 106 (H) 65 - 99 mg/dL   BUN 19 6 - 20 mg/dL   Creatinine, Ser 1.24 (H) 0.44 - 1.00 mg/dL   Calcium 9.0 8.9 - 10.3 mg/dL   Total Protein 6.7 6.5 - 8.1 g/dL   Albumin 3.9 3.5 - 5.0 g/dL   AST 32 15 - 41 U/L   ALT 15 14 - 54 U/L   Alkaline Phosphatase 88 38 - 126 U/L   Total Bilirubin 1.0 0.3 - 1.2 mg/dL   GFR calc non Af Amer 38 (L) >60 mL/min   GFR calc Af Amer 44 (L) >60 mL/min   Anion gap 11 5 - 15  CBC  Result Value Ref Range   WBC 7.7 3.6 - 11.0 K/uL   RBC 3.99 3.80 - 5.20 MIL/uL   Hemoglobin 13.4 12.0 - 16.0 g/dL   HCT 38.9 35.0 - 47.0  %   MCV 97.7 80.0 - 100.0 fL   MCH 33.5 26.0 - 34.0 pg   MCHC 34.3 32.0 - 36.0 g/dL   RDW 17.2 (H) 11.5 - 14.5 %   Platelets 337 150 - 440 K/uL  Lactic acid, plasma  Result Value Ref Range   Lactic Acid, Venous 1.4 0.5 - 1.9 mmol/L   Dg Thoracic Spine 2 View  Result Date: 06/23/2017 CLINICAL DATA:  T11 compression fracture. EXAM: THORACIC SPINE 2 VIEWS COMPARISON:  Abdominal CT from earlier today. Abdominal CT 05/20/2017. FINDINGS: T11 compression fracture with advanced height loss, over 75% anteriorly. When allowing for obliquity due to S shaped thoracolumbar scoliosis, no other acute fracture is noted. No traumatic malalignment. There is exaggerated thoracic kyphosis. Unavoidable artifact due to spinal brace. IMPRESSION: 1. T11 compression fracture that has occurred in the past month, with advanced height loss. The fracture is unhealed by CT earlier today. 2. Kyphoscoliosis. Electronically Signed   By: Monte Fantasia M.D.   On: 06/23/2017 19:31   Ct Abdomen Pelvis W  Contrast  Result Date: 06/23/2017 CLINICAL DATA:  Constipation and two-day history of right lower quadrant pain. EXAM: CT ABDOMEN AND PELVIS WITH CONTRAST TECHNIQUE: Multidetector CT imaging of the abdomen and pelvis was performed using the standard protocol following bolus administration of intravenous contrast. CONTRAST:  31mL ISOVUE-300 IOPAMIDOL (ISOVUE-300) INJECTION 61% COMPARISON:  05/20/2017 FINDINGS: Lower chest: 14 mm soft tissue nodule inferior right breast is stable. Hepatobiliary: No focal abnormality within the liver parenchyma. Trace prominence intrahepatic biliary ducts unchanged. Gallbladder surgically absent. Extrahepatic common ducts are nondilated. Pancreas: Diffuse atrophy of pancreatic parenchyma, stable. Spleen: No splenomegaly. No focal mass lesion. Adrenals/Urinary Tract: No adrenal nodule or mass. Cortical scarring noted in both kidneys. Tiny hypoattenuating lesions in the cortex of each kidney are too small  to characterize but likely represent cysts. Insert normal ureter is bladder is distended. Stomach/Bowel: Tiny hiatal hernia. Stomach is nondistended. No gastric wall thickening. No evidence of outlet obstruction. Duodenum is normally positioned as is the ligament of Treitz. No small bowel wall thickening. No small bowel dilatation. The terminal ileum is normal. The appendix is normal. Right and transverse colon diffusely stool-filled. Diverticular changes are noted in the left colon without evidence of diverticulitis. Vascular/Lymphatic: There is abdominal aortic atherosclerosis without aneurysm. 11 mm short axis gastrohepatic lymph node is stable since 02/20/2014. No hepatoduodenal ligament lymphadenopathy. No retroperitoneal lymphadenopathy. Insert no pelvic sidewall lymphadenopathy Reproductive: Insert normally uterus There is no adnexal mass. Other: No intraperitoneal free fluid. Musculoskeletal: Bone windows reveal no worrisome lytic or sclerotic osseous lesions. T11 compression fracture is new in the interval since 05/20/2017. IMPRESSION: 1. Interval development of a compression fracture at T11 with greater than 50% loss of height anteriorly. 2. Otherwise stable with no acute abnormality in the abdomen or pelvis. 3. Prominent stool volume in the right and transverse colon. Imaging features could be compatible with constipation in the appropriate clinical setting. 4. Left colonic diverticulosis without diverticulitis. 5. Tiny low-density renal lesions, too small to characterize but likely cortical cyst. 6.  Aortic Atherosclerois (ICD10-170.0) Electronically Signed   By: Misty Stanley M.D.   On: 06/23/2017 16:11

## 2017-06-23 NOTE — ED Notes (Signed)
Called pharmacy.  Pharmacy preparing and sending SMOG enema.

## 2017-06-27 ENCOUNTER — Emergency Department
Admission: EM | Admit: 2017-06-27 | Discharge: 2017-06-27 | Disposition: A | Discharge status: Home / Self Care | Payer: Medicare Other | Attending: Emergency Medicine | Admitting: Emergency Medicine

## 2017-06-27 DIAGNOSIS — Y929 Unspecified place or not applicable: Secondary | ICD-10-CM | POA: Insufficient documentation

## 2017-06-27 DIAGNOSIS — N183 Chronic kidney disease, stage 3 (moderate): Secondary | ICD-10-CM | POA: Insufficient documentation

## 2017-06-27 DIAGNOSIS — Y998 Other external cause status: Secondary | ICD-10-CM | POA: Diagnosis not present

## 2017-06-27 DIAGNOSIS — I4891 Unspecified atrial fibrillation: Secondary | ICD-10-CM | POA: Diagnosis not present

## 2017-06-27 DIAGNOSIS — E785 Hyperlipidemia, unspecified: Secondary | ICD-10-CM | POA: Diagnosis not present

## 2017-06-27 DIAGNOSIS — R109 Unspecified abdominal pain: Secondary | ICD-10-CM | POA: Insufficient documentation

## 2017-06-27 DIAGNOSIS — Y939 Activity, unspecified: Secondary | ICD-10-CM | POA: Insufficient documentation

## 2017-06-27 DIAGNOSIS — I130 Hypertensive heart and chronic kidney disease with heart failure and stage 1 through stage 4 chronic kidney disease, or unspecified chronic kidney disease: Secondary | ICD-10-CM | POA: Diagnosis not present

## 2017-06-27 DIAGNOSIS — S22000A Wedge compression fracture of unspecified thoracic vertebra, initial encounter for closed fracture: Secondary | ICD-10-CM | POA: Insufficient documentation

## 2017-06-27 DIAGNOSIS — Y33XXXA Other specified events, undetermined intent, initial encounter: Secondary | ICD-10-CM | POA: Diagnosis not present

## 2017-06-27 DIAGNOSIS — K5900 Constipation, unspecified: Secondary | ICD-10-CM | POA: Diagnosis not present

## 2017-06-27 DIAGNOSIS — Z79899 Other long term (current) drug therapy: Secondary | ICD-10-CM | POA: Diagnosis not present

## 2017-06-27 DIAGNOSIS — I509 Heart failure, unspecified: Secondary | ICD-10-CM | POA: Diagnosis not present

## 2017-06-27 MED ORDER — GLYCERIN (ADULT) 2 G RE SUPP: 1.0000 | RECTAL | 0 refills | Status: DC | PRN

## 2017-06-27 MED ORDER — MAGNESIUM CITRATE PO SOLN: 1.0000 | Freq: Once | ORAL | 1 refills | Status: AC

## 2017-06-27 NOTE — ED Triage Notes (Signed)
Pt from [facility] c/o lower abdominal pain. Seen in ED 4 days ago for constipation. Reports some nausea. Unsure of last BM. Reports passing some gas. VS stable.

## 2017-06-27 NOTE — ED Provider Notes (Signed)
[facility] Emergency Department Provider Note   ____________________________________________   I have reviewed the triage vital signs and the nursing notes.   HISTORY  Chief Complaint Abdominal Pain   History limited by: Not Limited   HPI Tammie Mcmahon is a 82 y.o. female who presents to the emergency department today because of concerns for abdominal pain. She describes as being located in the right lower quadrant. She states it started this morning. She has not had any associated nausea or vomiting. The patient states she has not had a bowel movement since she was last in the emergency department 4 days ago for abdominal pain and found to be constipated. she has been passing gas. The patient was seen in the emergency department 4 days ago. At that time a CT scan was performed which did not show any signs of appendicitis. It was a new compression fraction of the thoracic spine for which patient was given a TLSO brace and recommended neurosurgical follow-up.   Past Medical History:  Diagnosis Date  . (HFpEF) heart failure with preserved ejection fraction ([place])   . Atrial fibrillation and flutter ([place])   . Breast cancer ([place])   . CKD (chronic kidney disease), stage III   . Hyperlipidemia   . Hypertension   . Low back pain   . Menopausal and postmenopausal disorder   . PA (pernicious anemia)   . Thyroid disease   . Valvular heart disease     Patient Active Problem List   Diagnosis Date Noted  . Fall 02/13/2017  . Syncope 01/04/2017  . Lower abdominal pain 01/04/2017  . CKD (chronic kidney disease), stage III 01/04/2017  . Elevated troponin 01/04/2017  . Hypokalemia 01/04/2017  . Leukocytosis 01/04/2017  . Dysuria 01/04/2017  . Constipation 01/04/2017  . Symptomatic bradycardia 10/22/2016  . Atrial fibrillation ([place]) 10/22/2016  . Heart failure with preserved ejection fraction ([place]) 10/22/2016  . Hypertension 10/22/2016  .  Hyperlipidemia 10/22/2016  . Acute on chronic renal failure ([place]) 10/22/2016  . Other specified cardiac arrhythmias (CODE) 10/16/2016  . Confusion 09/10/2016    Past Surgical History:  Procedure Laterality Date  . BREAST SURGERY    . GALLBLADDER SURGERY    . MASTECTOMY Left   . THYROIDECTOMY      Prior to Admission medications   Medication Sig Start Date End Date Taking? Authorizing Provider  acetaminophen (TYLENOL) 325 MG tablet Take 650 mg by mouth every 6 (six) hours as needed. Pt takes 2 tabs orally as needed for pain.    [provider]  amiodarone (PACERONE) 200 MG tablet Take 200 mg by mouth daily.    [provider]  aspirin EC 81 MG tablet Take 81 mg by mouth daily.    [provider]  azelastine (ASTELIN) 0.1 % nasal spray Place 1 spray into both nostrils daily as needed for allergies.  09/30/16   [provider]  cholecalciferol (VITAMIN D) 1000 units tablet Take 1,000 Units by mouth daily.    [provider]  diclofenac sodium (VOLTAREN) 1 % GEL Apply topically 4 (four) times daily as needed.     [provider]  DULoxetine (CYMBALTA) 60 MG capsule Take 60 mg by mouth daily.     [provider]  levothyroxine (SYNTHROID, LEVOTHROID) 100 MCG tablet Take 1 tablet by mouth daily. 02/04/17   [provider]  levothyroxine (SYNTHROID, LEVOTHROID) 75 MCG tablet Take 1 tablet (75 mcg total) by mouth daily before breakfast. Patient not  taking: Reported on 02/13/2017 10/24/16   Max Sane, MD  lisinopril (PRINIVIL,ZESTRIL) 5 MG tablet Take 1 tablet (5 mg total) by mouth daily. 02/14/17   Hillary Bow, MD  omeprazole (PRILOSEC) 20 MG capsule Take 20 mg by mouth daily.    [provider]  polyethylene glycol (MIRALAX / GLYCOLAX) packet Take 17 g by mouth daily as needed for moderate constipation. 01/07/17   Max Sane, MD  potassium chloride (MICRO-K) 10 MEQ CR capsule Take 10 mEq by mouth daily.  04/10/14    [provider]  pravastatin (PRAVACHOL) 40 MG tablet Take 40 mg by mouth at bedtime.  05/19/14   [provider]  senna (SENOKOT) 8.6 MG TABS tablet Take 1 tablet (8.6 mg total) by mouth 2 (two) times daily. 01/07/17   Max Sane, MD  torsemide (DEMADEX) 20 MG tablet Take 40 mg by mouth daily. Pt takes 2 tabs orally daily.    [provider]    Allergies Patient has no known allergies.  Family History  Problem Relation Age of Onset  . Heart attack Father     Social History Social History  Substance Use Topics  . Smoking status: Never Smoker  . Smokeless tobacco: Former Systems developer    Types: Snuff  . Alcohol use No    Review of Systems Constitutional: No fever/chills Eyes: No visual changes. ENT: No sore throat. Cardiovascular: Denies chest pain. Respiratory: Denies shortness of breath. Gastrointestinal: positive for abdominal pain. Positive for constipation. Genitourinary: Negative for dysuria. Musculoskeletal: Negative for back pain. Skin: Negative for rash. Neurological: Negative for headaches, focal weakness or numbness.  ____________________________________________   PHYSICAL EXAM:  VITAL SIGNS: ED Triage Vitals  Enc Vitals Group     BP 06/27/17 1149 (!) 142/89     Pulse Rate 06/27/17 1149 98     Resp 06/27/17 1149 18     Temp 06/27/17 1149 97.6 F (36.4 C)     Temp Source 06/27/17 1149 Oral     SpO2 06/27/17 1149 100 %     Weight 06/27/17 1148 140 lb (63.5 kg)     Height 06/27/17 1148 5\' 5"  (1.651 m)   Constitutional: Alert and oriented. Well appearing and in no distress. Eyes: Conjunctivae are normal.  ENT   Head: Normocephalic and atraumatic.   Nose: No congestion/rhinnorhea.   Mouth/Throat: Mucous membranes are moist.   Neck: No stridor. Hematological/Lymphatic/Immunilogical: No cervical lymphadenopathy. Cardiovascular: Normal rate, regular rhythm.  No murmurs, rubs, or gallops.  Respiratory: Normal respiratory  effort without tachypnea nor retractions. Breath sounds are clear and equal bilaterally. No wheezes/rales/rhonchi. Gastrointestinal: Soft and non tender. No rebound. No guarding.  Genitourinary: Deferred Musculoskeletal: Normal range of motion in all extremities. No lower extremity edema. Neurologic:  Normal speech and language. No gross focal neurologic deficits are appreciated.  Skin:  Skin is warm, dry and intact. No rash noted. Psychiatric: Mood and affect are normal. Speech and behavior are normal. Patient exhibits appropriate insight and judgment.  ____________________________________________    LABS (pertinent positives/negatives)  None  ____________________________________________   EKG  None  ____________________________________________    RADIOLOGY  None  ____________________________________________   PROCEDURES  Procedures  ____________________________________________   INITIAL IMPRESSION / ASSESSMENT AND PLAN / ED COURSE  Pertinent labs & imaging results that were available during my care of the patient were reviewed by me and considered in my medical decision making (see chart for details).  patient presented to the emergency department today because of concerns for recurrence of a  right lower abdominal pain. Patient was seen in the emergency department 4 days ago for this. the patient states that since the last time she was here in the emergency room and she has not had any further bowel movements. On exam here she appears nontoxic. She has no significant tenderness to abdominal exam. No tympany. Patient did feel somewhat better after enema and getting some stool out. at this point given the patient does feel somewhat better think it is reasonable to discharge her back. She did have a CT performed 4 days ago which did not show any concerning intra-abdominal pathology although this did show a thoracic compression fracture. given negative CT a few days ago for  interabdominal pathology or think other things on my differential such as appendicitis, diverticulitis would be less likely. I did encourage patient to continue to wear her brace. Encourage patient to follow-up with neurosurgery for the back issue. Also discussed importance primary care follow-up.  ____________________________________________   FINAL CLINICAL IMPRESSION(S) / ED DIAGNOSES  Final diagnoses:  Abdominal pain, unspecified abdominal location  Constipation, unspecified constipation type  Compression fracture of body of thoracic vertebra [facility])     Note: This dictation was prepared with Dragon dictation. Any transcriptional errors that result from this process are unintentional     Nance Pear, MD 06/27/17 [phone]

## 2017-06-27 NOTE — Discharge Instructions (Signed)
Please wear the brace you were given at your last emergency department visit as instructed. Please seek medical attention for any high fevers, chest pain, shortness of breath, change in behavior, persistent vomiting, bloody stool or any other new or concerning symptoms.

## 2017-06-30 ENCOUNTER — Telehealth: Payer: Self-pay | Admitting: Emergency Medicine

## 2017-06-30 NOTE — Telephone Encounter (Signed)
Danae Chen from [facility] called to clarify rx from ED. Needs frequency on glycerine suppositories and instructions on when patient is to take the magnesium citrate.  Per dr Corky Downs glycerine suppostitory can be given once per day and the magnesium citrate bottle is to be taken today.

## 2017-07-26 ENCOUNTER — Emergency Department
Admission: EM | Admit: 2017-07-26 | Discharge: 2017-07-27 | Disposition: A | Discharge status: Home / Self Care | Payer: Medicare Other | Attending: Emergency Medicine | Admitting: Emergency Medicine

## 2017-07-26 ENCOUNTER — Emergency Department: Payer: Medicare Other

## 2017-07-26 DIAGNOSIS — I4891 Unspecified atrial fibrillation: Secondary | ICD-10-CM | POA: Diagnosis not present

## 2017-07-26 DIAGNOSIS — Z7982 Long term (current) use of aspirin: Secondary | ICD-10-CM | POA: Diagnosis not present

## 2017-07-26 DIAGNOSIS — I130 Hypertensive heart and chronic kidney disease with heart failure and stage 1 through stage 4 chronic kidney disease, or unspecified chronic kidney disease: Secondary | ICD-10-CM | POA: Diagnosis not present

## 2017-07-26 DIAGNOSIS — E079 Disorder of thyroid, unspecified: Secondary | ICD-10-CM | POA: Diagnosis not present

## 2017-07-26 DIAGNOSIS — Y929 Unspecified place or not applicable: Secondary | ICD-10-CM | POA: Diagnosis not present

## 2017-07-26 DIAGNOSIS — Y999 Unspecified external cause status: Secondary | ICD-10-CM | POA: Diagnosis not present

## 2017-07-26 DIAGNOSIS — E860 Dehydration: Secondary | ICD-10-CM | POA: Insufficient documentation

## 2017-07-26 DIAGNOSIS — S0990XA Unspecified injury of head, initial encounter: Secondary | ICD-10-CM | POA: Diagnosis not present

## 2017-07-26 DIAGNOSIS — Z79899 Other long term (current) drug therapy: Secondary | ICD-10-CM | POA: Insufficient documentation

## 2017-07-26 DIAGNOSIS — N3000 Acute cystitis without hematuria: Secondary | ICD-10-CM | POA: Insufficient documentation

## 2017-07-26 DIAGNOSIS — W010XXA Fall on same level from slipping, tripping and stumbling without subsequent striking against object, initial encounter: Secondary | ICD-10-CM | POA: Insufficient documentation

## 2017-07-26 DIAGNOSIS — R451 Restlessness and agitation: Secondary | ICD-10-CM | POA: Diagnosis not present

## 2017-07-26 DIAGNOSIS — W19XXXA Unspecified fall, initial encounter: Secondary | ICD-10-CM

## 2017-07-26 DIAGNOSIS — I509 Heart failure, unspecified: Secondary | ICD-10-CM | POA: Insufficient documentation

## 2017-07-26 DIAGNOSIS — Z87891 Personal history of nicotine dependence: Secondary | ICD-10-CM | POA: Insufficient documentation

## 2017-07-26 DIAGNOSIS — N183 Chronic kidney disease, stage 3 (moderate): Secondary | ICD-10-CM | POA: Insufficient documentation

## 2017-07-26 DIAGNOSIS — Y939 Activity, unspecified: Secondary | ICD-10-CM | POA: Diagnosis not present

## 2017-07-26 DIAGNOSIS — R410 Disorientation, unspecified: Secondary | ICD-10-CM | POA: Diagnosis not present

## 2017-07-26 LAB — CBC WITH DIFFERENTIAL/PLATELET
Basophils Absolute: 0.1 10*3/uL (ref 0–0.1)
Basophils Relative: 1 %
EOS ABS: 0.1 10*3/uL (ref 0–0.7)
EOS PCT: 1 %
HCT: 41.3 % (ref 35.0–47.0)
Hemoglobin: 13.8 g/dL (ref 12.0–16.0)
LYMPHS ABS: 2.2 10*3/uL (ref 1.0–3.6)
Lymphocytes Relative: 24 %
MCH: 34.4 pg — AB (ref 26.0–34.0)
MCHC: 33.4 g/dL (ref 32.0–36.0)
MCV: 103 fL — ABNORMAL HIGH (ref 80.0–100.0)
MONO ABS: 0.5 10*3/uL (ref 0.2–0.9)
MONOS PCT: 5 %
Neutro Abs: 6.2 10*3/uL (ref 1.4–6.5)
Neutrophils Relative %: 69 %
PLATELETS: 380 10*3/uL (ref 150–440)
RBC: 4.01 MIL/uL (ref 3.80–5.20)
RDW: 15.5 % — AB (ref 11.5–14.5)
WBC: 9 10*3/uL (ref 3.6–11.0)

## 2017-07-26 LAB — BASIC METABOLIC PANEL
ANION GAP: 11 (ref 5–15)
BUN: 16 mg/dL (ref 6–20)
CALCIUM: 8.8 mg/dL — AB (ref 8.9–10.3)
CO2: 24 mmol/L (ref 22–32)
CREATININE: 1.43 mg/dL — AB (ref 0.44–1.00)
Chloride: 102 mmol/L (ref 101–111)
GFR calc Af Amer: 37 mL/min — ABNORMAL LOW (ref >60)
GFR calc non Af Amer: 32 mL/min — ABNORMAL LOW (ref >60)
Glucose, Bld: 158 mg/dL — ABNORMAL HIGH (ref 65–99)
POTASSIUM: 3.3 mmol/L — AB (ref 3.5–5.1)
SODIUM: 137 mmol/L (ref 135–145)

## 2017-07-26 LAB — URINALYSIS, COMPLETE (UACMP) WITH MICROSCOPIC
Bilirubin Urine: NEGATIVE
GLUCOSE, UA: NEGATIVE mg/dL
Hgb urine dipstick: NEGATIVE
Ketones, ur: NEGATIVE mg/dL
Leukocytes, UA: NEGATIVE
Nitrite: NEGATIVE
PH: 5 (ref 5.0–8.0)
Protein, ur: NEGATIVE mg/dL
Specific Gravity, Urine: 1.01 (ref 1.005–1.030)

## 2017-07-26 LAB — TROPONIN I
TROPONIN I: 0.04 ng/mL — AB (ref <0.03)
Troponin I: 0.04 ng/mL (ref <0.03)

## 2017-07-26 LAB — PROTIME-INR
INR: 0.96
Prothrombin Time: 12.7 seconds (ref 11.4–15.2)

## 2017-07-26 MED ORDER — CEPHALEXIN 500 MG PO CAPS: 500.0000 mg | ORAL_CAPSULE | Freq: Three times a day (TID) | ORAL | 0 refills | Status: DC

## 2017-07-26 MED ORDER — CEPHALEXIN 500 MG PO CAPS
500.0000 mg | ORAL_CAPSULE | Freq: Once | ORAL | Status: AC
  Administered 2017-07-26: 500 mg via ORAL
  Filled 2017-07-26: qty 1

## 2017-07-26 MED ORDER — LORAZEPAM 2 MG/ML IJ SOLN
1.0000 mg | Freq: Once | INTRAMUSCULAR | Status: AC
  Administered 2017-07-26: 1 mg via INTRAVENOUS
  Filled 2017-07-26: qty 1

## 2017-07-26 MED ORDER — SODIUM CHLORIDE 0.9 % IV BOLUS (SEPSIS)
250.0000 mL | Freq: Once | INTRAVENOUS | Status: AC
  Administered 2017-07-26: 250 mL via INTRAVENOUS

## 2017-07-26 MED ORDER — HALOPERIDOL LACTATE 5 MG/ML IJ SOLN
2.0000 mg | Freq: Once | INTRAMUSCULAR | Status: DC
  Filled 2017-07-26: qty 1

## 2017-07-26 MED ORDER — SODIUM CHLORIDE 0.9 % IV BOLUS (SEPSIS)
500.0000 mL | Freq: Once | INTRAVENOUS | Status: AC
  Administered 2017-07-26: 500 mL via INTRAVENOUS

## 2017-07-26 NOTE — ED Triage Notes (Addendum)
Pt bib [facility] from [facility] d/t fall. Pt states was walking through bedroom and fell hitting back of head and right knee. Pt denies pain. Denies LOC. A&O to baseline

## 2017-07-26 NOTE — ED Notes (Signed)
Pt combative, screaming, unwilling to allow ED staff to perform EKG, trying to get up from bed, hitting staff. MD McShane consulted and meds ordered

## 2017-07-26 NOTE — ED Provider Notes (Addendum)
[facility] Emergency Department Provider Note  ____________________________________________   I have reviewed the triage vital signs and the nursing notes.   HISTORY  Chief Complaint Fall    HPI Tammie Mcmahon is a 83 y.o. female  who has a history of atrial fibrillation, heart failure, CK D, states she was in the bathroom and she fell. She denies syncope. She does not believe she hit her head. She really does not have many complaints however it is noted heart rates 125. She states she did not and does not have any chest which was read nausea vomiting abdominal pain chest shortness of breath headache stiff neck or anything aspirin she thinks she did hit her head but if feeling her head she cannot determine where it actually hurts. Level 5 chart caveat; no further history available due to patient status.    Past Medical History:  Diagnosis Date  . (HFpEF) heart failure with preserved ejection fraction ([place])   . Atrial fibrillation and flutter ([place])   . Breast cancer ([place])   . CKD (chronic kidney disease), stage III   . Hyperlipidemia   . Hypertension   . Low back pain   . Menopausal and postmenopausal disorder   . PA (pernicious anemia)   . Thyroid disease   . Valvular heart disease     Patient Active Problem List   Diagnosis Date Noted  . Fall 02/13/2017  . Syncope 01/04/2017  . Lower abdominal pain 01/04/2017  . CKD (chronic kidney disease), stage III ([place]) 01/04/2017  . Elevated troponin 01/04/2017  . Hypokalemia 01/04/2017  . Leukocytosis 01/04/2017  . Dysuria 01/04/2017  . Constipation 01/04/2017  . Symptomatic bradycardia 10/22/2016  . Atrial fibrillation ([place]) 10/22/2016  . Heart failure with preserved ejection fraction ([place]) 10/22/2016  . Hypertension 10/22/2016  . Hyperlipidemia 10/22/2016  . Acute on chronic renal failure ([place]) 10/22/2016  . Other specified cardiac arrhythmias (CODE) 10/16/2016  . Confusion 09/10/2016     Past Surgical History:  Procedure Laterality Date  . BREAST SURGERY    . GALLBLADDER SURGERY    . MASTECTOMY Left   . THYROIDECTOMY      Prior to Admission medications   Medication Sig Start Date End Date Taking? Authorizing Provider  acetaminophen (TYLENOL) 325 MG tablet Take 1,300 mg by mouth 2 (two) times daily.     [provider]  amiodarone (PACERONE) 200 MG tablet Take 200 mg by mouth daily.    [provider]  aspirin EC 81 MG tablet Take 81 mg by mouth daily.    [provider]  azelastine (ASTELIN) 0.1 % nasal spray Place 1 spray into both nostrils daily as needed for allergies.  09/30/16   [provider]  cholecalciferol (VITAMIN D) 1000 units tablet Take 1,000 Units by mouth daily.    [provider]  diclofenac sodium (VOLTAREN) 1 % GEL Apply topically 4 (four) times daily as needed.     [provider]  DULoxetine (CYMBALTA) 60 MG capsule Take 60 mg by mouth daily.     [provider]  glycerin adult 2 g suppository Place 1 suppository rectally as needed for constipation. 06/27/17   Nance Pear, MD  levothyroxine (SYNTHROID, LEVOTHROID) 100 MCG tablet Take 1 tablet by mouth daily. 02/04/17   [provider]  levothyroxine (SYNTHROID, LEVOTHROID) 75 MCG tablet Take 1 tablet (75 mcg total) by mouth daily before breakfast. Patient not taking: Reported on 02/13/2017 10/24/16   Max Sane, MD  lisinopril (  PRINIVIL,ZESTRIL) 5 MG tablet Take 1 tablet (5 mg total) by mouth daily. 02/14/17   Hillary Bow, MD  omeprazole (PRILOSEC) 20 MG capsule Take 20 mg by mouth daily.    [provider]  polyethylene glycol (MIRALAX / GLYCOLAX) packet Take 17 g by mouth daily as needed for moderate constipation. 01/07/17   Max Sane, MD  potassium chloride (MICRO-K) 10 MEQ CR capsule Take 10 mEq by mouth daily.  04/10/14   [provider]  pravastatin (PRAVACHOL) 40 MG tablet Take 40 mg by mouth at  bedtime.  05/19/14   [provider]  senna (SENOKOT) 8.6 MG TABS tablet Take 1 tablet (8.6 mg total) by mouth 2 (two) times daily. 01/07/17   Max Sane, MD  torsemide (DEMADEX) 20 MG tablet Take 40 mg by mouth daily.     [provider]    Allergies Patient has no known allergies.  Family History  Problem Relation Age of Onset  . Heart attack Father     Social History Social History  Substance Use Topics  . Smoking status: Never Smoker  . Smokeless tobacco: Former Systems developer    Types: Snuff  . Alcohol use No    Review of Systems Constitutional: No fever/chills Eyes: No visual changes. ENT: No sore throat. No stiff neck no neck pain Cardiovascular: Denies chest pain. Respiratory: Denies shortness of breath. Gastrointestinal:   no vomiting.  No diarrhea.  No constipation. Genitourinary: Negative for dysuria. Musculoskeletal: Negative lower extremity swelling Skin: Negative for rash. Neurological: Negative for severe headaches, focal weakness or numbness.   ____________________________________________   PHYSICAL EXAM:  VITAL SIGNS: ED Triage Vitals  Enc Vitals Group     BP 07/26/17 1936 (!) 162/98     Pulse Rate 07/26/17 1936 (!) 123     Resp 07/26/17 1936 (!) 22     Temp 07/26/17 1936 97.7 F (36.5 C)     Temp Source 07/26/17 1936 Oral     SpO2 07/26/17 1936 97 %     Weight 07/26/17 1938 129 lb 14.4 oz (58.9 kg)     Height 07/26/17 1938 5\' 5"  (1.651 m)     Head Circumference --      Peak Flow --      Pain Score 07/26/17 1935 0     Pain Loc --      Pain Edu? --      Excl. in [place]? --     Constitutional: Alert and to name and place,. Well appearing and in no acute distress. Eyes: Conjunctivae are normal Head: Atraumatic HEENT: No congestion/rhinnorhea. Mucous membranes are moist.  Oropharynx non-erythematous Neck:   Nontender with no meningismus, no masses, no stridor Cardiovascular: somewhat rapid heart rate 120, irregularly irregular regular  rhythm. Grossly normal heart sounds.  Good peripheral circulation. Respiratory: Normal respiratory effort.  No retractions. Lungs CTAB. Abdominal: Soft and nontender. No distention. No guarding no rebound Back:  There is no focal tenderness or step off.  there is no midline tenderness there are no lesions noted. there is no CVA tenderness Musculoskeletal: No lower extremity tenderness, no upper extremity tenderness. No joint effusions, no DVT signs strong distal pulses no edema Neurologic:  Normal speech and language. No gross focal neurologic deficits are appreciated.  Skin:  Skin is warm, dry and intact. No rash noted. Psychiatric: Mood and affect are normal. Speech and behavior are normal.  ____________________________________________   LABS (all labs ordered are listed, but only abnormal results are displayed)  Labs Reviewed  CBC WITH DIFFERENTIAL/PLATELET  URINALYSIS, COMPLETE (UACMP) WITH MICROSCOPIC  PROTIME-INR  TROPONIN I  BASIC METABOLIC PANEL    Pertinent labs  results that were available during my care of the patient were reviewed by me and considered in my medical decision making (see chart for details). ____________________________________________  EKG  I personally interpreted any EKGs ordered by me or triage EKG shows a regular tachycardic, negative neuro complex with no acute ischemic changes, rate around 120 06/06/1927, baseline artifact limits interpretation no obvious P waves seen most likely this is an atrial flutter with a 2 to one block or atrial fib. sinus tachycardia is certainly also considered. ____________________________________________  RADIOLOGY  Pertinent labs & imaging results that were available during my care of the patient were reviewed by me and considered in my medical decision making (see chart for details). If possible, patient and/or family made aware of any abnormal findings. ____________________________________________     PROCEDURES  Procedure(s) performed: None  Procedures  Critical Care performed: None  ____________________________________________   INITIAL IMPRESSION / ASSESSMENT AND PLAN / ED COURSE  Pertinent labs & imaging results that were available during my care of the patient were reviewed by me and considered in my medical decision making (see chart for details).  patient here with a non-syncopal fall, very reassuring exam, she does believe she hit her head A CT scan given her age, she does not appear to be anticoagulated according to notes to the extent that I can determine. Because she is tachycardic at 125, we will do some basic blood work and urinalysis to make sure there is no other pathology present although patient recollects a non-syncopal fall, there is always a question of the patient's memory affecting her history and given that she is tachycardic we will do a little bit better workup to further evaluate her.  ----------------------------------------- 8:01 PM on 07/26/2017 -----------------------------------------  Immediately after I left the room patient became very anxious and upset began to scream and yell in the room that we are holding her against her will and trying to climb out of the bed, she normally walks with a walker. She is lashing out at staff she feels that we are attacking her, and she is adamant that she wants to get up and walk about unfortunately without a walker, but this is not a safe activity and she is actually here for falling because she was walking without her walker. She is confused and demented.  I feel that for her safety and the safety of staff I'm going to have to give her an anxiolytic she is very upset does not understand why she is here, she wants [place] to call her brother. I will give her 1 mg of Ativan to see if we can make sure that she stays safe while we evaluate her. Her heart rate is possibly reactive to anxiety, she is initially somewhat  stressful about being here and now she is very anxious and upset.on a positive note, patient is moving all extremities and there is no evidence of acute neurologic deficit fortunately  ----------------------------------------- 8:27 PM on 07/26/2017 -----------------------------------------  I d/w family, her sister. She tells me pt gets like this sometimes.  Pt keeps asking to talk to her brother, who is dead.  I am giving her sedating meds for her safety.  ----------------------------------------- 8:37 PM on 07/26/2017 -----------------------------------------  patient's family are at bedside at this time they state the patient has been put in a "straight jacket" for agitation in the  past they're helping keep her calm while we evaluate her. We will continue to give medicines as needed but she is starting to calm down after meds. Obviously we are only doing this to protect her. Workup is pending. Signed out to dr. Alfred Levins at the end of my shift.    ____________________________________________   FINAL CLINICAL IMPRESSION(S) / ED DIAGNOSES  Final diagnoses:  None      This chart was dictated using voice recognition software.  Despite best efforts to proofread,  errors can occur which can change meaning.      Schuyler Amor, MD 07/26/17 Karl Bales    Schuyler Amor, MD 07/26/17 2002    Schuyler Amor, MD 07/26/17 2029    Schuyler Amor, MD 07/26/17 2038    Schuyler Amor, MD 07/26/17 2050

## 2017-07-26 NOTE — Discharge Instructions (Signed)
You were seen in the emergency department after a fall. Luckily all of your imaging studies did not show any evidence of injuries. Follow-up with you doctor within the next 2-3 days for further evaluation. Sometimes injuries can present at a later time and therefore it is imperative that you return to the emergency room if you have a severe headache, facial droop, neck pain, numbness or weakness of your extremities, slurred speech, difficulty finding words, chest pain, back pain, abdominal pain, or any other new symptoms that were not present during this visit. You may take Tylenol at home for your pain.   You have been seen in the Emergency Department (ED)  today for a urinary tract infection.  Most UTIs are caused by bacteria and need to be treated with antibiotics. It is important to complete your treatment so that the infection does not get worse. Take your antibiotics fully even if your symptoms start to get better after the first few doses. Drink PLENTY of fluids to help clear the infection.  Follow-up with your doctor or return to the ER immediately if your symptoms are getting worse, if you develop a fever, if you develop abdominal or flank pain, or if you start to vomit. Otherwise follow up with your doctor in 1 week if your symptoms are improving.   When should you call for help?  Call your doctor now or seek immediate medical care if:  Symptoms such as a fever, chills, nausea, or vomiting get worse or happen for the first time.  You have new pain in your back just below your rib cage. This is called flank pain.  There is new blood or pus in your urine.  You are not able to take or keep down your antibiotics. Your symptoms are not getting better after 48 hours of antibiotic treatment  Watch closely for changes in your health, and be sure to contact your doctor if:  You are not getting better after taking an antibiotic for 2 days.  Your symptoms go away but then come back.   How can  you care for yourself at home?  Take your antibiotics as prescribed. Do not stop taking them just because you feel better. You need to take the full course of antibiotics.  Take your medicines exactly as prescribed. Your doctor may have prescribed a medicine, such as phenazopyridine (Pyridium), to help relieve pain when you urinate. This turns your urine orange. You may stop taking it when your symptoms get better. But be sure to take all of your antibiotics, which treat the infection.  Drink extra water and juices such as cranberry and blueberry juices for the next day or two. This will help make the urine less concentrated and help wash out the bacteria causing the infection. (If you have kidney, heart, or liver disease and have to limit your fluids, talk with your doctor before you increase your fluid intake.)  Avoid drinks that are carbonated or have caffeine. They can irritate the bladder.  Urinate often. Try to empty your bladder each time.  To relieve pain, take a hot bath or lay a heating pad (set on low) over your lower belly or genital area. Never go to sleep with a heating pad in place.  To help prevent UTIs  Drink plenty of fluids, enough so that your urine is light yellow or clear like water. If you have kidney, heart, or liver disease and have to limit fluids, talk with your doctor before you increase the  amount of fluids you drink.  Urinate when you have the urge. Do not hold your urine for a long time. Urinate before you go to sleep.  Keep your vagina/ penis clean.

## 2017-07-26 NOTE — ED Provider Notes (Signed)
-----------------------------------------   8:45 PM on 07/26/2017 -----------------------------------------   Blood pressure 133/90, pulse (!) 116, temperature 97.7 F (36.5 C), temperature source Oral, resp. rate 16, height 5\' 5"  (1.651 m), weight 58.9 kg (129 lb 14.4 oz), SpO2 98 %.  Assuming care from Dr. Bronwen Betters of Tammie Mcmahon is a 83 y.o. female with a chief complaint of Fall .    Please refer to H&P by previous MD for further details.  The current plan of care is to f/u results of labs, urine, and CT head and reassess.   _________________________ 11:29 PM on 07/26/2017 -----------------------------------------  labs consistent with mild dehydration for which patient received 750 cc of fluid. Her heart rate has improved and is currently 99. Repeat EKG showing atrial fibrillation. The patient is on amiodarone every morning and received her dose earlier today. She is not in any other rate controlling agents. UA concerning for UTI for which she was started on Keflex. Patient initially was agitated however she is now very pleasant, smiling and polite. Per son who is at the bedside this is patient's baseline and she does have episodes of agitation like when she arrived in the emergency room. Her head CT shows no evidence of intracranial pathology. She has no signs and symptoms of basilar skull fracture.at this time I feel patient is stable for discharge back to her nursing home. She'll be discharged back on Keflex. Discussed return precautions with patient's son.  ED ECG REPORT I, Rudene Re, the attending physician, personally viewed and interpreted this ECG.  Atrial fibrillation, rate of 113, prolonged QTC, left axis deviation, no ST elevations or depressions.no significant changes when compared to Tammie Mcmahon, [place], MD 07/26/17 2333

## 2017-07-26 NOTE — ED Notes (Signed)
Family at bedside. 

## 2017-07-27 NOTE — ED Notes (Signed)
Reviewed d/c instructions, follow-up care with family. Family verbalized understanding. Family member (nephew) reported he would give instructions/paperwork to facility upon arrival.

## 2017-07-27 NOTE — ED Notes (Signed)
Pt family member :"Ray", states that if EMS is going to take a long time, he does not mind taking his relative back to [facility]. RN has been notified of this request. Rn states it would be difficult to estimate EMS arrival time, This EDT relayed that information.

## 2017-07-27 NOTE — ED Notes (Signed)
Report given to [place] from [facility]. Juliana instructed this RN to give discharge papers to family, and have family bring patient and discharge instructions/prescription to facility.

## 2017-07-27 NOTE — ED Notes (Signed)
Witnessed 1 mg (0.40mL) Lorazepam (Ativan) wasted by Ebony Hail, RN into the [employer] at 8:50pm in the Main ED; unable to document medication waste in Pyxis d/t pt previously being d/c'd from system.

## 2017-07-27 NOTE — ED Notes (Signed)
Removed peripheral IV from left AC.

## 2017-07-27 NOTE — ED Notes (Signed)
Waste 1 mg ativan into sink/dispose into sharps. Witness by Daiva Nakayama, RN.

## 2017-07-28 LAB — URINE CULTURE: Culture: NO GROWTH

## 2017-08-04 ENCOUNTER — Encounter
Admission: RE | Disposition: A | Discharge status: Home / Self Care | Payer: Self-pay | Source: Ambulatory Visit | Attending: Orthopedic Surgery

## 2017-08-04 ENCOUNTER — Ambulatory Visit: Payer: Medicare Other | Admitting: Anesthesiology

## 2017-08-04 ENCOUNTER — Ambulatory Visit: Payer: Medicare Other

## 2017-08-04 ENCOUNTER — Encounter: Payer: Self-pay | Admitting: *Deleted

## 2017-08-04 ENCOUNTER — Ambulatory Visit
Admission: RE | Admit: 2017-08-04 | Discharge: 2017-08-04 | Disposition: A | Discharge status: Home / Self Care | Payer: Medicare Other | Source: Ambulatory Visit | Attending: Orthopedic Surgery | Admitting: Orthopedic Surgery

## 2017-08-04 ENCOUNTER — Other Ambulatory Visit: Payer: Self-pay

## 2017-08-04 DIAGNOSIS — I509 Heart failure, unspecified: Secondary | ICD-10-CM | POA: Insufficient documentation

## 2017-08-04 DIAGNOSIS — M4854XA Collapsed vertebra, not elsewhere classified, thoracic region, initial encounter for fracture: Secondary | ICD-10-CM | POA: Insufficient documentation

## 2017-08-04 DIAGNOSIS — Z419 Encounter for procedure for purposes other than remedying health state, unspecified: Secondary | ICD-10-CM

## 2017-08-04 DIAGNOSIS — N183 Chronic kidney disease, stage 3 (moderate): Secondary | ICD-10-CM | POA: Insufficient documentation

## 2017-08-04 DIAGNOSIS — E039 Hypothyroidism, unspecified: Secondary | ICD-10-CM | POA: Insufficient documentation

## 2017-08-04 DIAGNOSIS — M545 Low back pain: Secondary | ICD-10-CM | POA: Insufficient documentation

## 2017-08-04 DIAGNOSIS — I4891 Unspecified atrial fibrillation: Secondary | ICD-10-CM | POA: Insufficient documentation

## 2017-08-04 DIAGNOSIS — Z853 Personal history of malignant neoplasm of breast: Secondary | ICD-10-CM | POA: Diagnosis not present

## 2017-08-04 DIAGNOSIS — E785 Hyperlipidemia, unspecified: Secondary | ICD-10-CM | POA: Diagnosis not present

## 2017-08-04 DIAGNOSIS — I110 Hypertensive heart disease with heart failure: Secondary | ICD-10-CM | POA: Insufficient documentation

## 2017-08-04 HISTORY — PX: KYPHOPLASTY: SHX5884

## 2017-08-04 SURGERY — KYPHOPLASTY
Anesthesia: General | Site: Back | Wound class: Clean

## 2017-08-04 MED ORDER — MIDAZOLAM HCL 2 MG/2ML IJ SOLN
INTRAMUSCULAR | Status: DC | PRN
  Administered 2017-08-04: .5 mg via INTRAVENOUS
  Administered 2017-08-04: 1 mg via INTRAVENOUS
  Administered 2017-08-04: .5 mg via INTRAVENOUS

## 2017-08-04 MED ORDER — CEFAZOLIN SODIUM-DEXTROSE 2-3 GM-%(50ML) IV SOLR
INTRAVENOUS | Status: DC | PRN
  Administered 2017-08-04: 2 g via INTRAVENOUS

## 2017-08-04 MED ORDER — LIDOCAINE HCL 1 % IJ SOLN
INTRAMUSCULAR | Status: DC | PRN
  Administered 2017-08-04 (×2): 10 mL

## 2017-08-04 MED ORDER — BUPIVACAINE-EPINEPHRINE (PF) 0.5% -1:200000 IJ SOLN
INTRAMUSCULAR | Status: DC | PRN
  Administered 2017-08-04: 15 mL

## 2017-08-04 MED ORDER — PROPOFOL 500 MG/50ML IV EMUL
INTRAVENOUS | Status: DC | PRN
  Administered 2017-08-04: 25 ug/kg/min via INTRAVENOUS

## 2017-08-04 MED ORDER — LACTATED RINGERS IV SOLN
INTRAVENOUS | Status: DC
  Administered 2017-08-04: 16:00:00 via INTRAVENOUS

## 2017-08-04 MED ORDER — HYDROCODONE-ACETAMINOPHEN 5-325 MG PO TABS: 1.0000 | ORAL_TABLET | Freq: Four times a day (QID) | ORAL | 0 refills | Status: DC | PRN

## 2017-08-04 MED ORDER — FENTANYL CITRATE (PF) 100 MCG/2ML IJ SOLN
25.0000 ug | INTRAMUSCULAR | Status: DC | PRN
Start: 2017-08-04 — End: 2017-08-04

## 2017-08-04 MED ORDER — PROPOFOL 10 MG/ML IV BOLUS
INTRAVENOUS | Status: AC
  Filled 2017-08-04: qty 20

## 2017-08-04 MED ORDER — PROPOFOL 10 MG/ML IV BOLUS
INTRAVENOUS | Status: DC | PRN
  Administered 2017-08-04: 20 mg via INTRAVENOUS

## 2017-08-04 MED ORDER — CEFAZOLIN SODIUM-DEXTROSE 2-4 GM/100ML-% IV SOLN
INTRAVENOUS | Status: AC
  Filled 2017-08-04: qty 100

## 2017-08-04 MED ORDER — MIDAZOLAM HCL 2 MG/2ML IJ SOLN
INTRAMUSCULAR | Status: AC
  Filled 2017-08-04: qty 2

## 2017-08-04 MED ORDER — ONDANSETRON HCL 4 MG/2ML IJ SOLN: 4.0000 mg | Freq: Once | INTRAMUSCULAR | Status: DC | PRN

## 2017-08-04 SURGICAL SUPPLY — 17 items
ADH SKN CLS APL DERMABOND .7 (GAUZE/BANDAGES/DRESSINGS) ×1
CEMENT KYPHON CX01A KIT/MIXER (Cement) ×5 IMPLANT
DERMABOND ADVANCED (GAUZE/BANDAGES/DRESSINGS) ×2
DERMABOND ADVANCED .7 DNX12 (GAUZE/BANDAGES/DRESSINGS) ×1 IMPLANT
DEVICE BIOPSY BONE KYPHX (INSTRUMENTS) ×3 IMPLANT
DRAPE C-ARM XRAY 36X54 (DRAPES) ×3 IMPLANT
DURAPREP 26ML APPLICATOR (WOUND CARE) ×3 IMPLANT
GLOVE SURG SYN 9.0  PF PI (GLOVE) ×2
GLOVE SURG SYN 9.0 PF PI (GLOVE) ×1 IMPLANT
GOWN SRG 2XL LVL 4 RGLN SLV (GOWNS) ×1 IMPLANT
GOWN STRL NON-REIN 2XL LVL4 (GOWNS) ×3
GOWN STRL REUS W/ TWL LRG LVL3 (GOWN DISPOSABLE) ×1 IMPLANT
GOWN STRL REUS W/TWL LRG LVL3 (GOWN DISPOSABLE) ×3
PACK KYPHOPLASTY (MISCELLANEOUS) ×3 IMPLANT
STRAP SAFETY BODY (MISCELLANEOUS) ×3 IMPLANT
TRAY KYPHOPAK 15/3 EXPRESS 1ST (MISCELLANEOUS) ×3 IMPLANT
TRAY KYPHOPAK 20/3 EXPRESS 1ST (MISCELLANEOUS) ×1 IMPLANT

## 2017-08-04 NOTE — Anesthesia Preprocedure Evaluation (Signed)
Anesthesia Evaluation  Patient identified by MRN, date of birth, ID band Patient awake    Reviewed: Allergy & Precautions, H&P , NPO status , Patient's Chart, lab work & pertinent test results, reviewed documented beta blocker date and time   History of Anesthesia Complications Negative for: history of anesthetic complications  Airway Mallampati: II  TM Distance: >3 FB Neck ROM: full    Dental  (+) Edentulous Upper, Edentulous Lower   Pulmonary shortness of breath, neg sleep apnea, neg COPD, Recent URI ,           Cardiovascular Exercise Tolerance: Good hypertension, (-) angina+CHF  (-) CAD, (-) Past MI, (-) Cardiac Stents and (-) CABG + dysrhythmias Atrial Fibrillation (-) Valvular Problems/Murmurs     Neuro/Psych PSYCHIATRIC DISORDERS negative neurological ROS     GI/Hepatic negative GI ROS, Neg liver ROS,   Endo/Other  neg diabetesHypothyroidism   Renal/GU CRFRenal disease  negative genitourinary   Musculoskeletal   Abdominal   Peds  Hematology  (+) Blood dyscrasia, anemia ,   Anesthesia Other Findings Past Medical History: No date: (HFpEF) heart failure with preserved ejection fraction (H[facility]) No date: Atrial fibrillation and flutter (H[facility]) No date: Breast cancer ([place]) No date: CKD (chronic kidney disease), stage III (H[facility]) No date: Hyperlipidemia No date: Hypertension No date: Low back pain No date: Menopausal and postmenopausal disorder No date: PA (pernicious anemia) No date: Thyroid disease No date: Valvular heart disease   Reproductive/Obstetrics negative OB ROS                             Anesthesia Physical Anesthesia Plan  ASA: III  Anesthesia Plan: General   Post-op Pain Management:    Induction: Intravenous  PONV Risk Score and Plan: 3 and Dexamethasone, Propofol infusion and Ondansetron  Airway Management Planned: Simple Face Mask  Additional Equipment:    Intra-op Plan:   Post-operative Plan:   Informed Consent: I have reviewed the patients History and Physical, chart, labs and discussed the procedure including the risks, benefits and alternatives for the proposed anesthesia with the patient or authorized representative who has indicated his/her understanding and acceptance.   Dental Advisory Given  Plan Discussed with: Anesthesiologist, CRNA and Surgeon  Anesthesia Plan Comments:         Anesthesia Quick Evaluation

## 2017-08-04 NOTE — Transfer of Care (Signed)
Immediate Anesthesia Transfer of Care Note  Patient: Tammie Mcmahon  Procedure(s) Performed: Truitt Merle (N/A Back)  Patient Location: PACU  Anesthesia Type:General  Level of Consciousness: awake, alert  and oriented  Airway & Oxygen Therapy: Patient Spontanous Breathing and Patient connected to face mask oxygen  Post-op Assessment: Report given to RN and Post -op Vital signs reviewed and stable  Post vital signs: Reviewed and stable  Last Vitals:  Vitals:   08/04/17 1345  BP: (!) 151/67  Pulse: (!) 56  Resp: 18  Temp: (!) 36.2 C  SpO2: 100%    Last Pain:  Vitals:   08/04/17 1345  TempSrc: Oral  PainSc: 8          Complications: No apparent anesthesia complications

## 2017-08-04 NOTE — Op Note (Signed)
08/04/2017  4:53 PM  PATIENT:  Tammie Mcmahon  83 y.o. female  PRE-OPERATIVE DIAGNOSIS:  CLOSED WEDGE COMPRESSION FRACTURE T11  POST-OPERATIVE DIAGNOSIS:  CLOSED WEDGE COMPRESSION FRACTURE T11  PROCEDURE:  Procedure(s): KYPHOPLASTY-T11 (N/A)  SURGEON: Laurene Footman, MD  ASSISTANTS: None  ANESTHESIA:   local and MAC  EBL:  Total I/O In: 500 [I.V.:500] Out: 1 [Blood:1]  BLOOD ADMINISTERED:none  DRAINS: none   LOCAL MEDICATIONS USED:  MARCAINE    and XYLOCAINE   SPECIMEN:  Source of Specimen:  T11 vertebral body  DISPOSITION OF SPECIMEN:  PATHOLOGY  COUNTS:  YES  TOURNIQUET:  * No tourniquets in log *  IMPLANTS: Bone cement  DICTATION: .Dragon Dictation  Patient brought the operating room and after adequate sedation was obtained the patient was placed prone and C-arm brought in with good visualization of T11  on the C-arm. After appropriate patient identification and timeout procedure local anesthetic was  infiltrated on the left at T11. The back was then prepped and draped in sterile fashion and repeat timeout procedure carried out. Spinal needle was used to get local asthenic down to the pedicle on the left 11. Small incision was then made and trocar advanced into the vertebral body and an extra pedicular fashion taking frequent C-arm views to make sure the neural foramen and spinal canal were not entered. Specimen was not obtained during biopsy part of the procedure followed by drilling carried out followed by placement of balloon inflation of T11 balloon to 2 cc . Next the cement was mixed and was appropriate consistency it was used to fill the vertebral bodies with about 2  cc into L1 with good fill and interdigitation. After the cement was set the trochar was removed and permanent C-arm views showed adequate position of the cement with good fill superior to inferior endplates medial and lateral. The wounds are closed with Dermabond followed by Band-Aids  PLAN OF  CARE: Discharge to home after PACU  PATIENT DISPOSITION:  PACU - hemodynamically stable.

## 2017-08-04 NOTE — Anesthesia Post-op Follow-up Note (Signed)
Anesthesia QCDR form completed.        

## 2017-08-04 NOTE — Anesthesia Postprocedure Evaluation (Signed)
Anesthesia Post Note  Patient: Tammie Mcmahon  Procedure(s) Performed: Truitt Merle (N/A Back)  Patient location during evaluation: PACU Anesthesia Type: General Level of consciousness: awake and alert and oriented Pain management: pain level controlled Vital Signs Assessment: post-procedure vital signs reviewed and stable Respiratory status: spontaneous breathing, nonlabored ventilation and respiratory function stable Cardiovascular status: blood pressure returned to baseline and stable Postop Assessment: no signs of nausea or vomiting Anesthetic complications: no     Last Vitals:  Vitals:   08/04/17 1727 08/04/17 1753  BP: 140/80 (!) 170/70  Pulse:  65  Resp: 16 16  Temp: (!) 36.3 C (!) 36.3 C  SpO2: 100% 100%    Last Pain:  Vitals:   08/04/17 1753  TempSrc: Temporal  PainSc: 0-No pain                 Oliveah Zwack

## 2017-08-04 NOTE — Anesthesia Procedure Notes (Signed)
Date/Time: 08/04/2017 4:08 PM Performed by: Nelda Marseille Pre-anesthesia Checklist: Patient identified, Emergency Drugs available, Suction available, Patient being monitored and Timeout performed Oxygen Delivery Method: Nasal cannula

## 2017-08-04 NOTE — Discharge Instructions (Addendum)
AMBULATORY SURGERY  DISCHARGE INSTRUCTIONS   1) The drugs that you were given will stay in your system until tomorrow so for the next 24 hours you should not:  A) Drive an automobile B) Make any legal decisions C) Drink any alcoholic beverage   2) You may resume regular meals tomorrow.  Today it is better to start with liquids and gradually work up to solid foods.  You may eat anything you prefer, but it is better to start with liquids, then soup and crackers, and gradually work up to solid foods.   3) Please notify your doctor immediately if you have any unusual bleeding, trouble breathing, redness and pain at the surgery site, drainage, fever, or pain not relieved by medication. 4)   5) Your post-operative visit with Dr.                                     is: Date:                        Time:    Please call to schedule your post-operative visit.  6) Additional Instructions:     Take it easy today and tomorrow.  Lie flat on her back tonight. Pain medicine as directed. Remove Band-Aid on Thursday and okay to shower

## 2017-08-04 NOTE — H&P (Signed)
Reviewed paper H+P, will be scanned into chart. No changes noted.  

## 2017-08-05 ENCOUNTER — Encounter: Payer: Self-pay | Admitting: Orthopedic Surgery

## 2017-08-06 LAB — SURGICAL PATHOLOGY

## 2017-09-01 ENCOUNTER — Emergency Department
Admission: EM | Admit: 2017-09-01 | Discharge: 2017-09-01 | Disposition: A | Discharge status: Home / Self Care | Payer: Medicare Other | Attending: Emergency Medicine | Admitting: Emergency Medicine

## 2017-09-01 ENCOUNTER — Other Ambulatory Visit: Payer: Self-pay

## 2017-09-01 DIAGNOSIS — N183 Chronic kidney disease, stage 3 (moderate): Secondary | ICD-10-CM | POA: Diagnosis not present

## 2017-09-01 DIAGNOSIS — I5089 Other heart failure: Secondary | ICD-10-CM | POA: Insufficient documentation

## 2017-09-01 DIAGNOSIS — R102 Pelvic and perineal pain: Secondary | ICD-10-CM

## 2017-09-01 DIAGNOSIS — I130 Hypertensive heart and chronic kidney disease with heart failure and stage 1 through stage 4 chronic kidney disease, or unspecified chronic kidney disease: Secondary | ICD-10-CM | POA: Diagnosis not present

## 2017-09-01 DIAGNOSIS — N3000 Acute cystitis without hematuria: Secondary | ICD-10-CM | POA: Diagnosis not present

## 2017-09-01 DIAGNOSIS — Z853 Personal history of malignant neoplasm of breast: Secondary | ICD-10-CM | POA: Diagnosis not present

## 2017-09-01 DIAGNOSIS — R1030 Lower abdominal pain, unspecified: Secondary | ICD-10-CM | POA: Diagnosis present

## 2017-09-01 LAB — CBC
HEMATOCRIT: 39 % (ref 35.0–47.0)
HEMOGLOBIN: 12.7 g/dL (ref 12.0–16.0)
MCH: 34 pg (ref 26.0–34.0)
MCHC: 32.7 g/dL (ref 32.0–36.0)
MCV: 104 fL — AB (ref 80.0–100.0)
Platelets: 349 10*3/uL (ref 150–440)
RBC: 3.75 MIL/uL — ABNORMAL LOW (ref 3.80–5.20)
RDW: 14.3 % (ref 11.5–14.5)
WBC: 10.2 10*3/uL (ref 3.6–11.0)

## 2017-09-01 LAB — URINALYSIS, COMPLETE (UACMP) WITH MICROSCOPIC
BACTERIA UA: NONE SEEN
BILIRUBIN URINE: NEGATIVE
Glucose, UA: NEGATIVE mg/dL
KETONES UR: 5 mg/dL — AB
Nitrite: NEGATIVE
PROTEIN: 100 mg/dL — AB
Specific Gravity, Urine: 1.024 (ref 1.005–1.030)
pH: 5 (ref 5.0–8.0)

## 2017-09-01 LAB — COMPREHENSIVE METABOLIC PANEL
ALBUMIN: 3.8 g/dL (ref 3.5–5.0)
ALK PHOS: 103 U/L (ref 38–126)
ALT: 18 U/L (ref 14–54)
ANION GAP: 8 (ref 5–15)
AST: 37 U/L (ref 15–41)
BILIRUBIN TOTAL: 0.8 mg/dL (ref 0.3–1.2)
BUN: 18 mg/dL (ref 6–20)
CHLORIDE: 106 mmol/L (ref 101–111)
CO2: 23 mmol/L (ref 22–32)
Calcium: 9.2 mg/dL (ref 8.9–10.3)
Creatinine, Ser: 1.26 mg/dL — ABNORMAL HIGH (ref 0.44–1.00)
GFR calc Af Amer: 43 mL/min — ABNORMAL LOW (ref >60)
GFR calc non Af Amer: 37 mL/min — ABNORMAL LOW (ref >60)
GLUCOSE: 108 mg/dL — AB (ref 65–99)
POTASSIUM: 4.4 mmol/L (ref 3.5–5.1)
Sodium: 137 mmol/L (ref 135–145)
Total Protein: 6.7 g/dL (ref 6.5–8.1)

## 2017-09-01 LAB — LIPASE, BLOOD: Lipase: 17 U/L (ref 11–51)

## 2017-09-01 MED ORDER — CEPHALEXIN 500 MG PO CAPS: 500.0000 mg | ORAL_CAPSULE | Freq: Four times a day (QID) | ORAL | 0 refills | Status: DC

## 2017-09-01 MED ORDER — ACETAMINOPHEN 325 MG PO TABS
650.0000 mg | ORAL_TABLET | Freq: Once | ORAL | Status: AC
  Administered 2017-09-01: 650 mg via ORAL
  Filled 2017-09-01: qty 2

## 2017-09-01 MED ORDER — CEPHALEXIN 500 MG PO CAPS
500.0000 mg | ORAL_CAPSULE | Freq: Once | ORAL | Status: AC
  Administered 2017-09-01: 500 mg via ORAL
  Filled 2017-09-01: qty 1

## 2017-09-01 NOTE — ED Triage Notes (Signed)
Pt to ER from [facility] via EMS for abdominal pain. Pt alert to self and place, disoriented to time and situation. Report that pt is her for abdominal pain, pt poor historian.

## 2017-09-01 NOTE — Discharge Instructions (Signed)
Please drink plenty of fluids to stay well-hydrated.  Please take the entire course of antibiotics, even if you are feeling better.  You may take Tylenol or Motrin for pain.  Return to the emergency department for severe pain, fever, vomiting, or any other symptoms concerning to you.

## 2017-09-01 NOTE — ED Provider Notes (Signed)
[facility] Emergency Department Provider Note  ____________________________________________  Time seen: Approximately 5:58 PM  I have reviewed the triage vital signs and the nursing notes.   HISTORY  Chief Complaint Abdominal Pain  The patient's history is limited due to her dementia.  HPI Tammie Mcmahon is a 83 y.o. female history of HTN, HL, A. fib, CHF, presenting with suprapubic pain.  The patient reports that earlier today around 130 she developed pain and points to her suprapubic area.  She states that she is "feeling much better since I got here."  She denies any associated nausea or vomiting fever or chills, diarrhea or constipation.  She does report a burning sensation with urination.  Past Medical History:  Diagnosis Date  . (HFpEF) heart failure with preserved ejection fraction ([place])   . Atrial fibrillation and flutter ([place])   . Breast cancer ([place])   . CKD (chronic kidney disease), stage III ([place])   . Hyperlipidemia   . Hypertension   . Low back pain   . Menopausal and postmenopausal disorder   . PA (pernicious anemia)   . Thyroid disease   . Valvular heart disease     Patient Active Problem List   Diagnosis Date Noted  . Fall 02/13/2017  . Syncope 01/04/2017  . Lower abdominal pain 01/04/2017  . CKD (chronic kidney disease), stage III ([place]) 01/04/2017  . Elevated troponin 01/04/2017  . Hypokalemia 01/04/2017  . Leukocytosis 01/04/2017  . Dysuria 01/04/2017  . Constipation 01/04/2017  . Symptomatic bradycardia 10/22/2016  . Atrial fibrillation ([place]) 10/22/2016  . Heart failure with preserved ejection fraction ([place]) 10/22/2016  . Hypertension 10/22/2016  . Hyperlipidemia 10/22/2016  . Acute on chronic renal failure ([place]) 10/22/2016  . Other specified cardiac arrhythmias (CODE) 10/16/2016  . Confusion 09/10/2016    Past Surgical History:  Procedure Laterality Date  . BREAST SURGERY    . GALLBLADDER SURGERY    .  KYPHOPLASTY N/A 08/04/2017   Procedure: [id number];  Surgeon: Hessie Knows, MD;  Location: [facility] ORS;  Service: Orthopedics;  Laterality: N/A;  . MASTECTOMY Left   . MASTECTOMY Left   . THYROIDECTOMY      Current Outpatient Rx  . Order #: [id number] Class: Historical Med  . Order #: [id number] Class: Historical Med  . Order #: [id number] Class: Historical Med  . Order #: [id number] Class: Historical Med  . Order #: [id number] Class: Print  . Order #: [id number] Class: Historical Med  . Order #: [id number] Class: Historical Med  . Order #: [id number] Class: Historical Med  . Order #: [id number] Class: Print  . Order #: [id number] Class: Historical Med  . Order #: [id number] Class: Historical Med  . Order #: [id number] Class: Print  . Order #: [id number] Class: Historical Med  . Order #: [id number] Class: Historical Med  . Order #: [id number] Class: Normal  . Order #: [id number] Class: Historical Med  . Order #: [id number] Class: Historical Med  . Order #: [id number] Class: Normal  . Order #: [id number] Class: Historical Med    Allergies Patient has no known allergies.  Family History  Problem Relation Age of Onset  . Heart attack Father     Social History Social History   Tobacco Use  . Smoking status: Never Smoker  . Smokeless tobacco: Former Systems developer    Types: Snuff  Substance Use Topics  . Alcohol use: No  . Drug use: No    Review of Systems Constitutional: No fever/chills.  No lightheadedness or syncope. Eyes: No visual changes. ENT: No sore throat. No congestion or rhinorrhea. Cardiovascular: Denies chest pain.  Denies palpitations. Respiratory: Denies shortness of breath.  No cough. Gastrointestinal: Positive suprapubic tenderness . No nausea, no vomiting.  No diarrhea.  No constipation. Genitourinary: Positive for dysuria. Musculoskeletal: Negative for back pain. Skin: Negative for rash. Neurological: Negative for headaches. No focal numbness, tingling or weakness.      ____________________________________________   PHYSICAL EXAM:  VITAL SIGNS: ED Triage Vitals  Enc Vitals Group     BP 09/01/17 1704 (!) 161/80     Pulse Rate 09/01/17 1704 67     Resp 09/01/17 1704 18     Temp 09/01/17 1704 (!) 97.5 F (36.4 C)     Temp Source 09/01/17 1704 Oral     SpO2 09/01/17 1704 96 %     Weight 09/01/17 1705 120 lb (54.4 kg)     Height --      Head Circumference --      Peak Flow --      Pain Score --      Pain Loc --      Pain Edu? --      Excl. in [place]? --     Constitutional: Alert and answers most questions appropriately. Well appearing and in no acute distress. . Eyes: Conjunctivae are normal.  EOMI. No scleral icterus. Head: Atraumatic. Nose: No congestion/rhinnorhea. Mouth/Throat: Mucous membranes are moist.  Neck: No stridor.  Supple.  No meningismus. Cardiovascular: Normal rate, regular rhythm. No murmurs, rubs or gallops.  Respiratory: Normal respiratory effort.  No accessory muscle use or retractions. Lungs CTAB.  No wheezes, rales or ronchi. Gastrointestinal: Soft, and nondistended.  No reproducible tenderness on examination. No guarding or rebound.  No peritoneal signs. Musculoskeletal: No LE edema.  Neurologic:  Alert, oriented to place, thinks it is December, 1918.Marland Kitchen  Speech is clear.  Face and smile are symmetric.  EOMI.  Moves all extremities well. Skin:  Skin is warm, dry and intact. No rash noted. Psychiatric: Mood and affect are normal. Speech and behavior are normal.  Normal judgement.  ____________________________________________   LABS (all labs ordered are listed, but only abnormal results are displayed)  Labs Reviewed  COMPREHENSIVE METABOLIC PANEL - Abnormal; Notable for the following components:      Result Value   Glucose, Bld 108 (*)    Creatinine, Ser 1.26 (*)    GFR calc non Af Amer 37 (*)    GFR calc Af Amer 43 (*)    All other components within normal limits  CBC - Abnormal; Notable for the following  components:   RBC 3.75 (*)    MCV 104.0 (*)    All other components within normal limits  URINALYSIS, COMPLETE (UACMP) WITH MICROSCOPIC - Abnormal; Notable for the following components:   Color, Urine AMBER (*)    APPearance CLOUDY (*)    Hgb urine dipstick SMALL (*)    Ketones, ur 5 (*)    Protein, ur 100 (*)    Leukocytes, UA TRACE (*)    Squamous Epithelial / LPF 0-5 (*)    All other components within normal limits  URINE CULTURE  LIPASE, BLOOD   ____________________________________________  EKG  Not indicated ____________________________________________  RADIOLOGY  No results found.  ____________________________________________   PROCEDURES  Procedure(s) performed: None  Procedures  Critical Care performed: No ____________________________________________   INITIAL IMPRESSION / ASSESSMENT AND PLAN / ED COURSE  Pertinent labs & imaging results that were available during my care of the patient were reviewed by me and considered in my medical decision making (see chart  for details).  83 y.o. F w/ hx of dementia presenting w/ suprapubic pain and dysuria.  Pt is a poor historian, but the patient is hemodynamically stable, with reassuring laboratory studies including a normal white blood cell count.  She most likely was having urinary symptoms or UTI.  Other intra-abdominal pathology is possible but less likely; this includes appendicitis, diverticulitis, and it is very unlikely that she has any aortic pathology.  Plan to check a UA in addition to her laboratory studies, and treat her with Tylenol for pain.  Plan reevaluation for final disposition.  I reviewed the patient's medical chart.  ----------------------------------------- 7:25 PM on 09/01/2017 -----------------------------------------  The patient's urinalysis does show a large number white blood cells and leukocyte esterase although no bacteria were seen detected I will plan to treat the patient for UTI with  a 5-day course of Keflex.  She will receive her first dose in the emergency department.  I have discussed the results with the patient and her son, who are in agreement with the plan for discharge and close PMD follow-up.  Return precautions were discussed. ____________________________________________  FINAL CLINICAL IMPRESSION(S) / ED DIAGNOSES  Final diagnoses:  Acute cystitis without hematuria  Suprapubic pain         NEW MEDICATIONS STARTED DURING THIS VISIT:  This SmartLink is deprecated. Use AVSMEDLIST instead to display the medication list for a patient.    Eula Listen, MD 09/01/17 1925

## 2017-09-04 LAB — URINE CULTURE: Culture: >=100000 — AB

## 2017-09-05 NOTE — Progress Notes (Addendum)
ED Culture Report Follow up  UCx from 11/27 with >100k Pseudomonas aeruginosa growing sensitive to cipro. Pt was discharged on cephalexin during visit. Spoke with Dr. Cherylann Banas in the ED who agreed with changing antibiotic to cipro 500 mg PO daily (CrCl <30 ml/min) for 5 days.   Attempted to call pt at number listed in chart [phone]) - states that voice mail box has not been set up (did not ring). [employer] and spoke to med tech, Stow, who stated pt is kind of confused at times. Med tech stated to call [facility] at [phone]. Called in Rx for cipro 500 mg - 1 tab PO daily x5 days; stop cephalexin to [facility] at [facility].

## 2017-09-09 ENCOUNTER — Emergency Department: Payer: Medicare Other

## 2017-09-09 ENCOUNTER — Encounter: Payer: Self-pay | Admitting: *Deleted

## 2017-09-09 ENCOUNTER — Inpatient Hospital Stay
Admission: EM | Admit: 2017-09-09 | Discharge: 2017-09-15 | DRG: 682 | Disposition: A | Discharge status: Hospice | Payer: Medicare Other | Attending: Internal Medicine | Admitting: Internal Medicine

## 2017-09-09 ENCOUNTER — Other Ambulatory Visit: Payer: Self-pay

## 2017-09-09 DIAGNOSIS — I130 Hypertensive heart and chronic kidney disease with heart failure and stage 1 through stage 4 chronic kidney disease, or unspecified chronic kidney disease: Secondary | ICD-10-CM | POA: Diagnosis not present

## 2017-09-09 DIAGNOSIS — N179 Acute kidney failure, unspecified: Secondary | ICD-10-CM

## 2017-09-09 DIAGNOSIS — Z6820 Body mass index (BMI) 20.0-20.9, adult: Secondary | ICD-10-CM

## 2017-09-09 DIAGNOSIS — I5032 Chronic diastolic (congestive) heart failure: Secondary | ICD-10-CM | POA: Diagnosis present

## 2017-09-09 DIAGNOSIS — D510 Vitamin B12 deficiency anemia due to intrinsic factor deficiency: Secondary | ICD-10-CM | POA: Diagnosis not present

## 2017-09-09 DIAGNOSIS — Z853 Personal history of malignant neoplasm of breast: Secondary | ICD-10-CM

## 2017-09-09 DIAGNOSIS — S2231XA Fracture of one rib, right side, initial encounter for closed fracture: Secondary | ICD-10-CM | POA: Diagnosis present

## 2017-09-09 DIAGNOSIS — L89150 Pressure ulcer of sacral region, unstageable: Secondary | ICD-10-CM | POA: Diagnosis not present

## 2017-09-09 DIAGNOSIS — S40211A Abrasion of right shoulder, initial encounter: Secondary | ICD-10-CM | POA: Diagnosis not present

## 2017-09-09 DIAGNOSIS — E079 Disorder of thyroid, unspecified: Secondary | ICD-10-CM | POA: Diagnosis present

## 2017-09-09 DIAGNOSIS — E43 Unspecified severe protein-calorie malnutrition: Secondary | ICD-10-CM | POA: Diagnosis not present

## 2017-09-09 DIAGNOSIS — F0390 Unspecified dementia without behavioral disturbance: Secondary | ICD-10-CM | POA: Diagnosis present

## 2017-09-09 DIAGNOSIS — F329 Major depressive disorder, single episode, unspecified: Secondary | ICD-10-CM | POA: Diagnosis not present

## 2017-09-09 DIAGNOSIS — E860 Dehydration: Secondary | ICD-10-CM | POA: Diagnosis not present

## 2017-09-09 DIAGNOSIS — N183 Chronic kidney disease, stage 3 (moderate): Secondary | ICD-10-CM | POA: Diagnosis not present

## 2017-09-09 DIAGNOSIS — R627 Adult failure to thrive: Secondary | ICD-10-CM | POA: Diagnosis not present

## 2017-09-09 DIAGNOSIS — Z9013 Acquired absence of bilateral breasts and nipples: Secondary | ICD-10-CM

## 2017-09-09 DIAGNOSIS — W19XXXA Unspecified fall, initial encounter: Secondary | ICD-10-CM | POA: Diagnosis present

## 2017-09-09 DIAGNOSIS — Z87440 Personal history of urinary (tract) infections: Secondary | ICD-10-CM

## 2017-09-09 DIAGNOSIS — L89620 Pressure ulcer of left heel, unstageable: Secondary | ICD-10-CM | POA: Diagnosis not present

## 2017-09-09 DIAGNOSIS — M25552 Pain in left hip: Secondary | ICD-10-CM | POA: Diagnosis present

## 2017-09-09 DIAGNOSIS — R296 Repeated falls: Secondary | ICD-10-CM | POA: Diagnosis present

## 2017-09-09 DIAGNOSIS — Z515 Encounter for palliative care: Secondary | ICD-10-CM | POA: Diagnosis not present

## 2017-09-09 DIAGNOSIS — I482 Chronic atrial fibrillation: Secondary | ICD-10-CM | POA: Diagnosis not present

## 2017-09-09 DIAGNOSIS — Z66 Do not resuscitate: Secondary | ICD-10-CM | POA: Diagnosis not present

## 2017-09-09 DIAGNOSIS — L89890 Pressure ulcer of other site, unstageable: Secondary | ICD-10-CM | POA: Diagnosis present

## 2017-09-09 DIAGNOSIS — Z7982 Long term (current) use of aspirin: Secondary | ICD-10-CM

## 2017-09-09 DIAGNOSIS — Z9181 History of falling: Secondary | ICD-10-CM

## 2017-09-09 DIAGNOSIS — M25551 Pain in right hip: Secondary | ICD-10-CM

## 2017-09-09 DIAGNOSIS — F05 Delirium due to known physiological condition: Secondary | ICD-10-CM | POA: Diagnosis present

## 2017-09-09 DIAGNOSIS — E785 Hyperlipidemia, unspecified: Secondary | ICD-10-CM | POA: Diagnosis present

## 2017-09-09 DIAGNOSIS — Z79890 Hormone replacement therapy (postmenopausal): Secondary | ICD-10-CM

## 2017-09-09 DIAGNOSIS — Z87891 Personal history of nicotine dependence: Secondary | ICD-10-CM

## 2017-09-09 DIAGNOSIS — L8990 Pressure ulcer of unspecified site, unspecified stage: Secondary | ICD-10-CM

## 2017-09-09 DIAGNOSIS — N3001 Acute cystitis with hematuria: Secondary | ICD-10-CM

## 2017-09-09 LAB — URINALYSIS, COMPLETE (UACMP) WITH MICROSCOPIC
Bacteria, UA: NONE SEEN
Bilirubin Urine: NEGATIVE
GLUCOSE, UA: NEGATIVE mg/dL
Hgb urine dipstick: NEGATIVE
Ketones, ur: 5 mg/dL — AB
Nitrite: NEGATIVE
PH: 5 (ref 5.0–8.0)
Protein, ur: 30 mg/dL — AB
SPECIFIC GRAVITY, URINE: 1.027 (ref 1.005–1.030)
SQUAMOUS EPITHELIAL / LPF: NONE SEEN

## 2017-09-09 LAB — BASIC METABOLIC PANEL
ANION GAP: 12 (ref 5–15)
BUN: 27 mg/dL — AB (ref 6–20)
CALCIUM: 8.9 mg/dL (ref 8.9–10.3)
CO2: 19 mmol/L — ABNORMAL LOW (ref 22–32)
Chloride: 110 mmol/L (ref 101–111)
Creatinine, Ser: 2.01 mg/dL — ABNORMAL HIGH (ref 0.44–1.00)
GFR calc Af Amer: 25 mL/min — ABNORMAL LOW (ref >60)
GFR, EST NON AFRICAN AMERICAN: 21 mL/min — AB (ref >60)
GLUCOSE: 117 mg/dL — AB (ref 65–99)
POTASSIUM: 4.5 mmol/L (ref 3.5–5.1)
SODIUM: 141 mmol/L (ref 135–145)

## 2017-09-09 LAB — CBC
HCT: 35.9 % (ref 35.0–47.0)
Hemoglobin: 12 g/dL (ref 12.0–16.0)
MCH: 35 pg — AB (ref 26.0–34.0)
MCHC: 33.4 g/dL (ref 32.0–36.0)
MCV: 104.9 fL — AB (ref 80.0–100.0)
PLATELETS: 347 10*3/uL (ref 150–440)
RBC: 3.43 MIL/uL — AB (ref 3.80–5.20)
RDW: 14.8 % — AB (ref 11.5–14.5)
WBC: 10 10*3/uL (ref 3.6–11.0)

## 2017-09-09 LAB — TROPONIN I: Troponin I: 0.06 ng/mL (ref <0.03)

## 2017-09-09 LAB — MRSA PCR SCREENING: MRSA BY PCR: NEGATIVE

## 2017-09-09 MED ORDER — RISPERIDONE 0.5 MG PO TBDP
0.5000 mg | ORAL_TABLET | Freq: Two times a day (BID) | ORAL | Status: DC
  Administered 2017-09-09 – 2017-09-10 (×2): 0.5 mg via ORAL
  Filled 2017-09-09 (×4): qty 1

## 2017-09-09 MED ORDER — MORPHINE SULFATE (PF) 2 MG/ML IV SOLN
2.0000 mg | INTRAVENOUS | Status: DC | PRN
  Administered 2017-09-09 – 2017-09-15 (×4): 2 mg via INTRAVENOUS
  Filled 2017-09-09 (×4): qty 1

## 2017-09-09 MED ORDER — ONDANSETRON HCL 4 MG/2ML IJ SOLN: 4.0000 mg | Freq: Four times a day (QID) | INTRAMUSCULAR | Status: DC | PRN

## 2017-09-09 MED ORDER — DOCUSATE SODIUM 100 MG PO CAPS
100.0000 mg | ORAL_CAPSULE | Freq: Two times a day (BID) | ORAL | Status: DC
  Administered 2017-09-09 – 2017-09-14 (×9): 100 mg via ORAL
  Filled 2017-09-09 (×12): qty 1

## 2017-09-09 MED ORDER — VITAMIN D 1000 UNITS PO TABS
1000.0000 [IU] | ORAL_TABLET | Freq: Every day | ORAL | Status: DC
  Administered 2017-09-11 – 2017-09-14 (×4): 1000 [IU] via ORAL
  Filled 2017-09-09 (×6): qty 1

## 2017-09-09 MED ORDER — BISACODYL 5 MG PO TBEC: 5.0000 mg | DELAYED_RELEASE_TABLET | Freq: Every day | ORAL | Status: DC | PRN

## 2017-09-09 MED ORDER — CIPROFLOXACIN IN D5W 400 MG/200ML IV SOLN
400.0000 mg | INTRAVENOUS | Status: DC
  Administered 2017-09-09 – 2017-09-10 (×2): 400 mg via INTRAVENOUS
  Filled 2017-09-09 (×3): qty 200

## 2017-09-09 MED ORDER — LEVOTHYROXINE SODIUM 88 MCG PO TABS
88.0000 ug | ORAL_TABLET | Freq: Every day | ORAL | Status: DC
  Administered 2017-09-11 – 2017-09-15 (×5): 88 ug via ORAL
  Filled 2017-09-09 (×9): qty 1

## 2017-09-09 MED ORDER — SODIUM CHLORIDE 0.9 % IV SOLN
INTRAVENOUS | Status: DC
  Administered 2017-09-09 – 2017-09-12 (×7): via INTRAVENOUS

## 2017-09-09 MED ORDER — TRAZODONE HCL 50 MG PO TABS
25.0000 mg | ORAL_TABLET | Freq: Every evening | ORAL | Status: DC | PRN
  Administered 2017-09-11 – 2017-09-13 (×3): 25 mg via ORAL
  Filled 2017-09-09 (×3): qty 1

## 2017-09-09 MED ORDER — FENTANYL CITRATE (PF) 100 MCG/2ML IJ SOLN
12.5000 ug | Freq: Once | INTRAMUSCULAR | Status: AC
  Administered 2017-09-09: 12.5 ug via INTRAVENOUS
  Filled 2017-09-09: qty 2

## 2017-09-09 MED ORDER — ACETAMINOPHEN 500 MG PO TABS
1000.0000 mg | ORAL_TABLET | Freq: Once | ORAL | Status: AC
  Administered 2017-09-09: 1000 mg via ORAL
  Filled 2017-09-09: qty 2

## 2017-09-09 MED ORDER — ACETAMINOPHEN 325 MG PO TABS: 650.0000 mg | ORAL_TABLET | Freq: Four times a day (QID) | ORAL | Status: DC | PRN

## 2017-09-09 MED ORDER — AMIODARONE HCL 200 MG PO TABS
200.0000 mg | ORAL_TABLET | Freq: Every day | ORAL | Status: DC
  Administered 2017-09-11 – 2017-09-14 (×4): 200 mg via ORAL
  Filled 2017-09-09 (×5): qty 1

## 2017-09-09 MED ORDER — ASPIRIN 81 MG PO CHEW
81.0000 mg | CHEWABLE_TABLET | Freq: Every day | ORAL | Status: DC
  Administered 2017-09-11 – 2017-09-14 (×4): 81 mg via ORAL
  Filled 2017-09-09 (×5): qty 1

## 2017-09-09 MED ORDER — MAGNESIUM HYDROXIDE 400 MG/5ML PO SUSP
30.0000 mL | ORAL | Status: DC | PRN
  Administered 2017-09-14: 30 mL via ORAL
  Filled 2017-09-09 (×2): qty 30

## 2017-09-09 MED ORDER — ONDANSETRON HCL 4 MG PO TABS: 4.0000 mg | ORAL_TABLET | Freq: Four times a day (QID) | ORAL | Status: DC | PRN

## 2017-09-09 MED ORDER — HALOPERIDOL LACTATE 5 MG/ML IJ SOLN
1.0000 mg | Freq: Four times a day (QID) | INTRAMUSCULAR | Status: DC | PRN
  Administered 2017-09-11 – 2017-09-12 (×2): 1 mg via INTRAVENOUS
  Filled 2017-09-09: qty 1

## 2017-09-09 MED ORDER — HEPARIN SODIUM (PORCINE) 5000 UNIT/ML IJ SOLN
5000.0000 [IU] | Freq: Three times a day (TID) | INTRAMUSCULAR | Status: DC
  Administered 2017-09-09 – 2017-09-15 (×15): 5000 [IU] via SUBCUTANEOUS
  Filled 2017-09-09 (×15): qty 1

## 2017-09-09 MED ORDER — PNEUMOCOCCAL VAC POLYVALENT 25 MCG/0.5ML IJ INJ
0.5000 mL | INJECTION | INTRAMUSCULAR | Status: DC
  Filled 2017-09-09: qty 0.5

## 2017-09-09 MED ORDER — AZELASTINE HCL 0.1 % NA SOLN
1.0000 | Freq: Every day | NASAL | Status: DC | PRN
  Filled 2017-09-09: qty 30

## 2017-09-09 MED ORDER — CEPHALEXIN 500 MG PO CAPS
500.0000 mg | ORAL_CAPSULE | Freq: Four times a day (QID) | ORAL | Status: DC
  Administered 2017-09-09 (×2): 500 mg via ORAL
  Filled 2017-09-09 (×3): qty 1

## 2017-09-09 MED ORDER — ACETAMINOPHEN 650 MG RE SUPP: 650.0000 mg | Freq: Four times a day (QID) | RECTAL | Status: DC | PRN

## 2017-09-09 MED ORDER — PANTOPRAZOLE SODIUM 40 MG PO TBEC
40.0000 mg | DELAYED_RELEASE_TABLET | Freq: Every day | ORAL | Status: DC
  Filled 2017-09-09: qty 1

## 2017-09-09 MED ORDER — SODIUM CHLORIDE 0.9 % IV BOLUS (SEPSIS)
1000.0000 mL | Freq: Once | INTRAVENOUS | Status: AC
  Administered 2017-09-09: 1000 mL via INTRAVENOUS

## 2017-09-09 MED ORDER — DULOXETINE HCL 60 MG PO CPEP
60.0000 mg | ORAL_CAPSULE | Freq: Every day | ORAL | Status: DC
Start: 2017-09-10 — End: 2017-09-15
  Administered 2017-09-11 – 2017-09-14 (×4): 60 mg via ORAL
  Filled 2017-09-09 (×2): qty 2
  Filled 2017-09-09 (×2): qty 1
  Filled 2017-09-09: qty 2
  Filled 2017-09-09 (×4): qty 1
  Filled 2017-09-09: qty 2

## 2017-09-09 MED ORDER — OXYCODONE-ACETAMINOPHEN 5-325 MG PO TABS
1.0000 | ORAL_TABLET | Freq: Four times a day (QID) | ORAL | Status: DC | PRN
  Administered 2017-09-09 – 2017-09-15 (×3): 1 via ORAL
  Filled 2017-09-09 (×3): qty 1

## 2017-09-09 NOTE — ED Triage Notes (Addendum)
Per EMS report, patient had an unwitnessed fall last night at [facility]. Per staff report, Patient is at baseline mental status. Staff states patient c/o pain in left hip. Patient c/o pain  Right ankle, right wrist, back of head and right hip. Patient is restless on stretcher.Safety sitter requested.

## 2017-09-09 NOTE — ED Notes (Signed)
Patient is still agitated, still trying to crawl out of bed. Safety sitter at bedside.

## 2017-09-09 NOTE — ED Notes (Signed)
Dr. Alfred Levins aware of Troponin of 0.06.

## 2017-09-09 NOTE — Progress Notes (Signed)
Pharmacy Antibiotic Note  Tammie Mcmahon is a 83 y.o. female admitted on 09/09/2017 with UTI.  Pharmacy has been consulted for cipro dosing.  Plan: Ciprofloxacin 400mg  iv q24h  Estimated Creatinine Clearance: 15.6 mL/min (A) (by C-G formula based on SCr of 2.01 mg/dL (H)).   Weight: 110 lb 6.4 oz (50.1 kg)  Temp (24hrs), Avg:98.3 F (36.8 C), Min:98.3 F (36.8 C), Max:98.3 F (36.8 C)  Recent Labs  Lab 09/09/17 1303  WBC 10.0  CREATININE 2.01*    Estimated Creatinine Clearance: 15.6 mL/min (A) (by C-G formula based on SCr of 2.01 mg/dL (H)).    No Known Allergies  Antimicrobials this admission: Anti-infectives (From admission, onward)   Start     Dose/Rate Route Frequency Ordered Stop   09/09/17 1515  ciprofloxacin (CIPRO) IVPB 400 mg     400 mg 200 mL/hr over 60 Minutes Intravenous Every 24 hours 09/09/17 1512     09/09/17 1430  cephALEXin (KEFLEX) capsule 500 mg     500 mg Oral 4 times daily 09/09/17 1425        Microbiology results: Recent Results (from the past 240 hour(s))  Urine culture     Status: Abnormal   Collection Time: 09/01/17  6:13 PM  Result Value Ref Range Status   Specimen Description URINE, CLEAN CATCH  Final   Special Requests NONE  Final   Culture >=100,000 COLONIES/mL PSEUDOMONAS AERUGINOSA (A)  Final   Report Status 09/04/2017 FINAL  Final   Organism ID, Bacteria PSEUDOMONAS AERUGINOSA (A)  Final      Susceptibility   Pseudomonas aeruginosa - MIC*    CEFTAZIDIME 8 SENSITIVE Sensitive     CIPROFLOXACIN <=0.25 SENSITIVE Sensitive     GENTAMICIN 4 SENSITIVE Sensitive     IMIPENEM 2 SENSITIVE Sensitive     PIP/TAZO 8 SENSITIVE Sensitive     CEFEPIME 4 SENSITIVE Sensitive     * >=100,000 COLONIES/mL PSEUDOMONAS AERUGINOSA     Thank you for allowing pharmacy to be a part of this patient's care.  Donna Christen [place] 09/09/2017 3:12 PM

## 2017-09-09 NOTE — H&P (Signed)
[facility] NAME: Tammie Mcmahon    MR#:  [id number]  DATE OF BIRTH:  [birth year]  DATE OF ADMISSION:  09/09/2017  PRIMARY CARE PHYSICIAN: Kirk Ruths, MD   REQUESTING/REFERRING PHYSICIAN: DR.Varonese  CHIEF COMPLAINT: Fall   Chief Complaint  Patient presents with  . Fall    HISTORY OF PRESENT ILLNESS:  Valary Manahan  is a 83 y.o. female with a known history of essential hypertension, dementia, recurrent UTIs, recently completed Keflex films from [place] also because of the fall.  Patient fell last night and also this morning.  Patient complained of left hip pain this morning so she came to ER by EMS.  Left hip x-rays are negative for fractures.  Patient complains of abdominal pain.  According to her niece patient had birthday party yesterday and she had few bites of cake  small amount of pizza..  Not eating well for the past few weeks associated with.  Patient has sitter at bedside in the emergency room because she is trying to get out of the bed and also agitated.  PAST MEDICAL HISTORY:   Past Medical History:  Diagnosis Date  . (HFpEF) heart failure with preserved ejection fraction ([place])   . Atrial fibrillation and flutter ([place])   . Breast cancer ([place])   . CKD (chronic kidney disease), stage III ([place])   . Hyperlipidemia   . Hypertension   . Low back pain   . Menopausal and postmenopausal disorder   . PA (pernicious anemia)   . Thyroid disease   . Valvular heart disease     PAST SURGICAL HISTOIRY:   Past Surgical History:  Procedure Laterality Date  . BREAST SURGERY    . GALLBLADDER SURGERY    . KYPHOPLASTY N/A 08/04/2017   Procedure: [id number];  Surgeon: Hessie Knows, MD;  Location: [facility] ORS;  Service: Orthopedics;  Laterality: N/A;  . MASTECTOMY Left   . MASTECTOMY Left   . THYROIDECTOMY      SOCIAL HISTORY:   Social History   Tobacco Use  . Smoking status: Never Smoker  . Smokeless  tobacco: Former Systems developer    Types: Snuff  Substance Use Topics  . Alcohol use: No    FAMILY HISTORY:   Family History  Problem Relation Age of Onset  . Heart attack Father     DRUG ALLERGIES:  No Known Allergies  REVIEW OF SYSTEMS:  Not able to obtain because of her confusion. MEDICATIONS AT HOME:   Prior to Admission medications   Medication Sig Start Date End Date Taking? Authorizing Provider  acetaminophen (TYLENOL 8 HOUR ARTHRITIS PAIN) 650 MG CR tablet Take 650-1,300 mg by mouth 2 (two) times daily. (0800 & 2000)    Yes [provider]  amiodarone (PACERONE) 200 MG tablet Take 200 mg by mouth daily. (0800)   Yes [provider]  aspirin 81 MG chewable tablet Chew 81 mg by mouth daily. (0800)   Yes [provider]  cholecalciferol (VITAMIN D) 1000 units tablet Take 1,000 Units by mouth daily. (0800)   Yes [provider]  ciprofloxacin (CIPRO) 500 MG tablet Take 500 mg by mouth daily. 09/06/17 09/11/17 Yes [provider]  DULoxetine (CYMBALTA) 60 MG capsule Take 60 mg by mouth daily. (0800)   Yes [provider]  levothyroxine (SYNTHROID, LEVOTHROID) 88 MCG tablet Take 88 mcg by mouth daily before breakfast. 30-60 minutes before breakfast   Yes [provider]  lidocaine (LIDODERM)  5 % Place 1 patch onto the skin daily. Remove & Discard patch within 12 hours or as directed by MD ON at 0800, & OFF at 2000   Yes [provider]  lisinopril (PRINIVIL,ZESTRIL) 5 MG tablet Take 1 tablet (5 mg total) by mouth daily. 02/14/17  Yes Sudini, Alveta Heimlich, MD  omeprazole (PRILOSEC) 20 MG capsule Take 20 mg by mouth daily. (0700)   Yes [provider]  polyethylene glycol (MIRALAX / GLYCOLAX) packet Take 17 g by mouth daily as needed for moderate constipation. Patient taking differently: Take 17 g by mouth daily.  01/07/17  Yes Max Sane, MD  potassium chloride (MICRO-K) 10 MEQ CR capsule Take 10 mEq by mouth daily.  (0800) 04/10/14  Yes [provider]  pravastatin (PRAVACHOL) 40 MG tablet Take 40 mg by mouth at bedtime. (2000) 05/19/14  Yes [provider]  senna (SENOKOT) 8.6 MG TABS tablet Take 1 tablet (8.6 mg total) by mouth 2 (two) times daily. 01/07/17  Yes Max Sane, MD  azelastine (ASTELIN) 0.1 % nasal spray Place 1 spray into both nostrils daily as needed for allergies.  09/30/16   [provider]  cephALEXin (KEFLEX) 500 MG capsule Take 1 capsule (500 mg total) by mouth 4 (four) times daily for 10 days. Patient not taking: Reported on 09/09/2017 09/01/17 09/11/17  Eula Listen, MD  diclofenac sodium (VOLTAREN) 1 % GEL Apply 2 g topically 4 (four) times daily as needed (FOR PAIN).     [provider]  HYDROcodone-acetaminophen (NORCO) 5-325 MG tablet Take 1 tablet by mouth every 6 (six) hours as needed for moderate pain. 08/04/17   Hessie Knows, MD  magnesium hydroxide (MILK OF MAGNESIA) 400 MG/5ML suspension Take 30 mLs by mouth every other day as needed (for constipation).    [provider]  Nutritional Supplements (NUTRITIONAL SHAKE PO) Take 1 Bottle by mouth 3 (three) times daily.    [provider]      VITAL SIGNS:  Blood pressure (!) 152/65, pulse 71, temperature 98.3 F (36.8 C), temperature source Oral, resp. rate 20, weight 50.1 kg (110 lb 6.4 oz), SpO2 97 %.  PHYSICAL EXAMINATION:  GENERAL:  83 y.o.-year-old patient lying in the bed, agitated, constantly moving and getting up in stretcher.  EYES: Pupils equal, round, reactive to light . No scleral icterus. Extraocular muscles intact.  HEENT: Head atraumatic, normocephalic. Oropharynx and nasopharynx clear.  NECK:  Supple, no jugular venous distention. No thyroid enlargement, no tenderness.  LUNGS: Normal breath sounds bilaterally, no wheezing, rales,rhonchi or crepitation. No use of accessory muscles of respiration.  CARDIOVASCULAR: S1, S2 normal. No murmurs, rubs, or  gallops.  ABDOMEN: Soft, nontender, nondistended. Bowel sounds present. No organomegaly or mass.  EXTREMITIES: No pedal edema, cyanosis, or clubbing.  NEUROLOGIC:  Confused,unable to do full neuro exam due to dementia and agitation, PSYCHIATRIC: The patient is awake  But disoriented.  SKIN: No obvious rash, lesion, or ulcer.   LABORATORY PANEL:   CBC Recent Labs  Lab 09/09/17 1303  WBC 10.0  HGB 12.0  HCT 35.9  PLT 347   ------------------------------------------------------------------------------------------------------------------  Chemistries  Recent Labs  Lab 09/09/17 1303  NA 141  K 4.5  CL 110  CO2 19*  GLUCOSE 117*  BUN 27*  CREATININE 2.01*  CALCIUM 8.9   ------------------------------------------------------------------------------------------------------------------  Cardiac Enzymes Recent Labs  Lab 09/09/17 1303  TROPONINI 0.06*   ------------------------------------------------------------------------------------------------------------------  RADIOLOGY:  Dg Thoracic Spine 2 View  Result Date: 09/09/2017 CLINICAL DATA:  Patient  sustained a fall. Patient had difficulty cooperating with positioning. EXAM: THORACIC SPINE 2 VIEWS COMPARISON:  Thoracic spine series dated June 23, 2017 FINDINGS: The patient has undergone interval kyphoplasty of a T11 fracture. The other thoracic vertebral bodies are reasonably well maintained in height. There is prominent kyphosis and there is moderate curvature convex toward the left centered at T11. There is dense calcification in the wall of the aortic arch. IMPRESSION: Stable degenerative and post traumatic changes of the thoracic and upper lumbar spine. Interval kyphoplasty of T11. Thoracic aortic atherosclerosis. Electronically Signed   By: David  Martinique M.D.   On: 09/09/2017 13:01   Dg Shoulder Right  Result Date: 09/09/2017 CLINICAL DATA:  83 year old female presenting with pain after fall EXAM: RIGHT SHOULDER -  2+ VIEW COMPARISON:  01/05/2017 CXR FINDINGS: There is a displaced right lateral fifth rib fracture without significant callus formation suggesting a recent fracture. No pneumothorax or pulmonary contusion. The [facility] and glenohumeral joints are intact with mild degenerative joint space narrowing of the glenohumeral joint superiorly. No fracture noted about the glenohumeral nor AC joints. IMPRESSION: 1. Recent/acute appearing right lateral fifth rib fracture with displacement. No associated findings of pneumothorax or pulmonary contusion. No pleural effusion. 2. Osteoarthritis of the glenohumeral joint. Electronically Signed   By: Ashley Royalty M.D.   On: 09/09/2017 13:14   Dg Ankle 2 Views Right  Result Date: 09/09/2017 CLINICAL DATA:  Fall.  Right ankle pain. EXAM: RIGHT ANKLE - 2 VIEW COMPARISON:  None. FINDINGS: The ankle is located. No acute bone or soft tissue abnormalities are present. Mild degenerative changes are noted. IMPRESSION: Mild degenerative changes without acute fracture. Electronically Signed   By: San Morelle M.D.   On: 09/09/2017 13:04   Ct Head Wo Contrast  Result Date: 09/09/2017 CLINICAL DATA:  Unwitnessed fall last night. EXAM: CT HEAD WITHOUT CONTRAST CT CERVICAL SPINE WITHOUT CONTRAST TECHNIQUE: Multidetector CT imaging of the head and cervical spine was performed following the standard protocol without intravenous contrast. Multiplanar CT image reconstructions of the cervical spine were also generated. COMPARISON:  07/26/2017 FINDINGS: CT HEAD FINDINGS Brain: There is atrophy and chronic small vessel disease changes. Physiologic calcifications in the basal ganglia, stable. No acute intracranial abnormality. Specifically, no hemorrhage, hydrocephalus, mass lesion, acute infarction, or significant intracranial injury. Vascular: No hyperdense vessel or unexpected calcification. Skull: No acute calvarial abnormality. Sinuses/Orbits: Visualized paranasal sinuses and mastoids clear.  Orbital soft tissues unremarkable. Other: None CT CERVICAL SPINE FINDINGS Alignment: Normal Skull base and vertebrae: No fractures Soft tissues and spinal canal: Prevertebral soft tissues are normal. No epidural or paraspinal hematoma. Disc levels: Diffuse degenerative disc disease with disc space narrowing. Mild posterior spurring. Mild diffuse degenerative facet disease. Upper chest: No acute findings. Other: None IMPRESSION: No acute intracranial abnormality.Atrophy, chronic microvascular disease. Degenerative changes in the cervical spine. No acute bony abnormality. Electronically Signed   By: Rolm Baptise M.D.   On: 09/09/2017 12:43   Ct Cervical Spine Wo Contrast  Result Date: 09/09/2017 CLINICAL DATA:  Unwitnessed fall last night. EXAM: CT HEAD WITHOUT CONTRAST CT CERVICAL SPINE WITHOUT CONTRAST TECHNIQUE: Multidetector CT imaging of the head and cervical spine was performed following the standard protocol without intravenous contrast. Multiplanar CT image reconstructions of the cervical spine were also generated. COMPARISON:  07/26/2017 FINDINGS: CT HEAD FINDINGS Brain: There is atrophy and chronic small vessel disease changes. Physiologic calcifications in the basal ganglia, stable. No acute intracranial abnormality. Specifically, no hemorrhage, hydrocephalus, mass lesion, acute infarction, or significant  intracranial injury. Vascular: No hyperdense vessel or unexpected calcification. Skull: No acute calvarial abnormality. Sinuses/Orbits: Visualized paranasal sinuses and mastoids clear. Orbital soft tissues unremarkable. Other: None CT CERVICAL SPINE FINDINGS Alignment: Normal Skull base and vertebrae: No fractures Soft tissues and spinal canal: Prevertebral soft tissues are normal. No epidural or paraspinal hematoma. Disc levels: Diffuse degenerative disc disease with disc space narrowing. Mild posterior spurring. Mild diffuse degenerative facet disease. Upper chest: No acute findings. Other: None  IMPRESSION: No acute intracranial abnormality.Atrophy, chronic microvascular disease. Degenerative changes in the cervical spine. No acute bony abnormality. Electronically Signed   By: Rolm Baptise M.D.   On: 09/09/2017 12:43   Dg Hip Unilat With Pelvis 2-3 Views Left  Result Date: 09/09/2017 CLINICAL DATA:  83 year old female presenting with left hip pain after fall. EXAM: DG HIP (WITH OR WITHOUT PELVIS) 2-3V LEFT COMPARISON:  01/04/2017 FINDINGS: There is no evidence of hip fracture or dislocation. Stable mild degenerative joint space narrowing of the left hip. The included bony pelvis appears intact. IMPRESSION: Stable mild joint space narrowing of the left hip, degenerative in etiology. No acute fracture or malalignment. Electronically Signed   By: Ashley Royalty M.D.   On: 09/09/2017 13:08   Dg Hip Unilat With Pelvis 2-3 Views Right  Result Date: 09/09/2017 CLINICAL DATA:  83 year old female presenting with right hip pain after fall. EXAM: DG HIP (WITH OR WITHOUT PELVIS) 2-3V RIGHT COMPARISON:  05/20/2017 radiographs FINDINGS: There is no evidence of hip fracture or dislocation. Lower lumbar degenerative disc and facet arthropathy of the included lumbar spine from L4 through S1. The arcuate lines of the sacrum appear grossly intact. No acute pelvic fracture. No suspicious osseous lesions. Degenerative joint space narrowing is identified of both hips. Bilateral iliofemoral arteriosclerosis. IMPRESSION: Mild degenerative joint space narrowing of both hips. No acute fracture is identified. Electronically Signed   By: Ashley Royalty M.D.   On: 09/09/2017 13:04    EKG:   Orders placed or performed during the hospital encounter of 09/09/17  . ED EKG  . ED EKG  . EKG 12-Lead  . EKG 12-Lead   normal Sinus rhythm at 68 bpm, no ST-T changes. IMPRESSION AND PLAN:   83 year old female patient with multiple medical problems of hypertension, hyperlipidemia, dementia came in from nursing home because of  multiple falls, left hip pain. 1.  Left hip pain likely musculoskeletal patient has no evidence of fracture on x-ray. 2.  Recurrent falls secondary to dementia.  Patient has no evidence of intracranial abnormality, CT spine also use unremarkable.  Get physical therapy evaluation. 3.  Acute renal injury likely due to dehydration, patient not eating and drinking well for the past few weeks.  Continue gentle hydration.  And avoid ACE inhibitors. 4.  Acute fractures on the right fifth ribs: Secondary to fall, patient complains of mild abdominal pain likely coming from the fractures.  Continue incentive spirometry, pain medicines. 5 dementia, depression: Continue Cymbalta. 6.  History of chronic atrial fibrillation: Rate controlled, continue amiodarone, spent. 7.  Due to multiple medical problems, dementia patient physician Dr. Ouida Sills requested hospice to follow at nursing home, will involve palliative care/ 8 UTI: Patient recent urine cultures showed Pseudomonas UTI more than 100,000 colonies from November 30 culture results.  Patient was given Keflex but now she is on Cipro, continue Cipro p.o. daily, d/c keflex.   All the records are reviewed and case discussed with ED provider. Management plans discussed with the patient, family and they are in agreement.  CODE STATUS;full  TOTAL TIME TAKING CARE OF THIS PATIENT: 40minutes.    Epifanio Lesches M.D on 09/09/2017 at 2:39 PM  Between 7am to 6pm - Pager - [phone]  After 6pm go to [email or web address] - password [email or web address] [facility]  [place] [facility]  Office  [phone]  CC: Primary care physician; Kirk Ruths, MD  Note: This dictation was prepared with Dragon dictation along with smaller phrase technology. Any transcriptional errors that result from this process are unintentional.

## 2017-09-09 NOTE — ED Provider Notes (Signed)
[facility] Emergency Department Provider Note  ____________________________________________  Time seen: Approximately 12:09 PM  I have reviewed the triage vital signs and the nursing notes.   HISTORY  Chief Complaint Fall  Level 5 caveat:  Portions of the history and physical were unable to be obtained due to dementia   HPI Tammie Mcmahon is a 83 y.o. female with a history of dementia, heart failure, chronic kidney disease, hypertension, hyperlipidemia, anemia, and atrial fibrillation who presents for evaluation after unwitnessed fall. According to EMS patient had a fall at her skilled nursing facility yesterday. This morning she was complaining of pain in her right hip, right shoulder, and a headache. Patient has no recollection of the fall and is very agitated. Per EMS is this patient's baseline.  Past Medical History:  Diagnosis Date  . (HFpEF) heart failure with preserved ejection fraction ([place])   . Atrial fibrillation and flutter ([place])   . Breast cancer ([place])   . CKD (chronic kidney disease), stage III ([place])   . Hyperlipidemia   . Hypertension   . Low back pain   . Menopausal and postmenopausal disorder   . PA (pernicious anemia)   . Thyroid disease   . Valvular heart disease     Patient Active Problem List   Diagnosis Date Noted  . Fall 02/13/2017  . Syncope 01/04/2017  . Lower abdominal pain 01/04/2017  . CKD (chronic kidney disease), stage III ([place]) 01/04/2017  . Elevated troponin 01/04/2017  . Hypokalemia 01/04/2017  . Leukocytosis 01/04/2017  . Dysuria 01/04/2017  . Constipation 01/04/2017  . Symptomatic bradycardia 10/22/2016  . Atrial fibrillation ([place]) 10/22/2016  . Heart failure with preserved ejection fraction ([place]) 10/22/2016  . Hypertension 10/22/2016  . Hyperlipidemia 10/22/2016  . Acute on chronic renal failure ([place]) 10/22/2016  . Other specified cardiac arrhythmias (CODE) 10/16/2016  . Confusion 09/10/2016     Past Surgical History:  Procedure Laterality Date  . BREAST SURGERY    . GALLBLADDER SURGERY    . KYPHOPLASTY N/A 08/04/2017   Procedure: [id number];  Surgeon: Hessie Knows, MD;  Location: [facility] ORS;  Service: Orthopedics;  Laterality: N/A;  . MASTECTOMY Left   . MASTECTOMY Left   . THYROIDECTOMY      Prior to Admission medications   Medication Sig Start Date End Date Taking? Authorizing Provider  acetaminophen (TYLENOL 8 HOUR ARTHRITIS PAIN) 650 MG CR tablet Take 650-1,300 mg by mouth 2 (two) times daily. (0800 & 2000)    Yes [provider]  amiodarone (PACERONE) 200 MG tablet Take 200 mg by mouth daily. (0800)   Yes [provider]  aspirin 81 MG chewable tablet Chew 81 mg by mouth daily. (0800)   Yes [provider]  cholecalciferol (VITAMIN D) 1000 units tablet Take 1,000 Units by mouth daily. (0800)   Yes [provider]  ciprofloxacin (CIPRO) 500 MG tablet Take 500 mg by mouth daily. 09/06/17 09/11/17 Yes [provider]  DULoxetine (CYMBALTA) 60 MG capsule Take 60 mg by mouth daily. (0800)   Yes [provider]  levothyroxine (SYNTHROID, LEVOTHROID) 88 MCG tablet Take 88 mcg by mouth daily before breakfast. 30-60 minutes before breakfast   Yes [provider]  lidocaine (LIDODERM) 5 % Place 1 patch onto the skin daily. Remove & Discard patch within 12 hours or as directed by MD ON at 0800, & OFF at 2000   Yes [provider]  lisinopril (PRINIVIL,ZESTRIL) 5 MG tablet Take 1 tablet (5 mg  total) by mouth daily. 02/14/17  Yes Sudini, Alveta Heimlich, MD  omeprazole (PRILOSEC) 20 MG capsule Take 20 mg by mouth daily. (0700)   Yes [provider]  polyethylene glycol (MIRALAX / GLYCOLAX) packet Take 17 g by mouth daily as needed for moderate constipation. Patient taking differently: Take 17 g by mouth daily.  01/07/17  Yes Max Sane, MD  potassium chloride (MICRO-K) 10 MEQ CR capsule Take 10 mEq by mouth  daily. (0800) 04/10/14  Yes [provider]  pravastatin (PRAVACHOL) 40 MG tablet Take 40 mg by mouth at bedtime. (2000) 05/19/14  Yes [provider]  senna (SENOKOT) 8.6 MG TABS tablet Take 1 tablet (8.6 mg total) by mouth 2 (two) times daily. 01/07/17  Yes Max Sane, MD  azelastine (ASTELIN) 0.1 % nasal spray Place 1 spray into both nostrils daily as needed for allergies.  09/30/16   [provider]  cephALEXin (KEFLEX) 500 MG capsule Take 1 capsule (500 mg total) by mouth 4 (four) times daily for 10 days. Patient not taking: Reported on 09/09/2017 09/01/17 09/11/17  Eula Listen, MD  diclofenac sodium (VOLTAREN) 1 % GEL Apply 2 g topically 4 (four) times daily as needed (FOR PAIN).     [provider]  HYDROcodone-acetaminophen (NORCO) 5-325 MG tablet Take 1 tablet by mouth every 6 (six) hours as needed for moderate pain. 08/04/17   Hessie Knows, MD  magnesium hydroxide (MILK OF MAGNESIA) 400 MG/5ML suspension Take 30 mLs by mouth every other day as needed (for constipation).    [provider]  Nutritional Supplements (NUTRITIONAL SHAKE PO) Take 1 Bottle by mouth 3 (three) times daily.    [provider]    Allergies Patient has no known allergies.  Family History  Problem Relation Age of Onset  . Heart attack Father     Social History Social History   Tobacco Use  . Smoking status: Never Smoker  . Smokeless tobacco: Former Systems developer    Types: Snuff  Substance Use Topics  . Alcohol use: No  . Drug use: No    Review of Systems Constitutional: Negative for fever. ENT: Negative for facial injury or neck injury Cardiovascular: Negative for chest injury. Respiratory: Negative for shortness of breat Gastrointestinal: Negative for abdominal pain  Musculoskeletal: + upper back pain, b/l hip pain, R shoulder pain, and R ankle pain Skin: Negative for laceration/abrasions. Neurological: + HA.  Level 5 caveat:  Portions of the  history and physical were unable to be obtained due to dementia  ____________________________________________   PHYSICAL EXAM:  VITAL SIGNS Vitals:   09/09/17 1211 09/09/17 1412  BP: (!) 144/85 (!) 152/65  Pulse: 68 71  Resp: 16 20  Temp: 98.3 F (36.8 C)   SpO2: 97% 97%    Constitutional: Alert, disoriented. No acute distress. Does not appear intoxicated. HEENT Head: Normocephalic and atraumatic. Face: No facial bony tenderness. Stable midface Ears: No hemotympanum bilaterally. No Battle sign Eyes: No eye injury. PERRL. No raccoon eyes Nose: Nontender. No epistaxis. No rhinorrhea Mouth/Throat: Mucous membranes are moist. No oropharyngeal blood. No dental injury. Airway patent without stridor. Normal voice. Neck: no C-collar in place. No midline c-spine tenderness.  Cardiovascular: Normal rate, regular rhythm. Normal and symmetric distal pulses are present in all extremities. Pulmonary/Chest: Chest wall is stable and nontender to palpation/compression. Normal respiratory effort. Breath sounds are normal. No crepitus.  Abdominal: Soft, nontender, non distended. Musculoskeletal: Bruise to b/l hips, R shoulder and R ankle. Nontender with normal full range of  motion in all other extremities. No deformities. Paraspinal thoracic ttp. No thoracic or lumbar midline spinal tenderness. Pelvis is stable. Skin: Skin is warm, dry and intact. No abrasions or contutions. Neurological: Moves all extremities to command. Face is symmetric  Glascow Coma Score: 4 - Opens eyes on own 6 - Follows simple motor commands 4 - Seems confused, disoriented GCS: 14   ____________________________________________   LABS (all labs ordered are listed, but only abnormal results are displayed)  Labs Reviewed  TROPONIN I - Abnormal; Notable for the following components:      Result Value   Troponin I 0.06 (*)    All other components within normal limits  CBC - Abnormal; Notable for the following  components:   RBC 3.43 (*)    MCV 104.9 (*)    MCH 35.0 (*)    RDW 14.8 (*)    All other components within normal limits  BASIC METABOLIC PANEL - Abnormal; Notable for the following components:   CO2 19 (*)    Glucose, Bld 117 (*)    BUN 27 (*)    Creatinine, Ser 2.01 (*)    GFR calc non Af Amer 21 (*)    GFR calc Af Amer 25 (*)    All other components within normal limits  URINALYSIS, COMPLETE (UACMP) WITH MICROSCOPIC - Abnormal; Notable for the following components:   Color, Urine AMBER (*)    APPearance CLEAR (*)    Ketones, ur 5 (*)    Protein, ur 30 (*)    Leukocytes, UA SMALL (*)    All other components within normal limits   ____________________________________________  EKG  ED ECG REPORT I, Rudene Re, the attending physician, personally viewed and interpreted this ECG.  Normal sinus rhythm, rate of 68, normal PR and QRS intervals, prolonged QTC, no ST elevations or depressions, low voltage QRS. NSR has replaced sinus tachycardia but no other significant changes  ____________________________________________  RADIOLOGY  Reviewed with no acute injuries  ____________________________________________   PROCEDURES  Procedure(s) performed: None Procedures Critical Care performed:  None ____________________________________________   INITIAL IMPRESSION / ASSESSMENT AND PLAN / ED COURSE  83 y.o. female with a history of dementia, heart failure, chronic kidney disease, hypertension, hyperlipidemia, anemia, and atrial fibrillation who presents for evaluation after unwitnessed fall. Complaining of pain on b/l hips, thoracic back, R shoulder, R ankle. Head and cspine CT and XRs have been ordered. Since fall was unwitnessed will get basic labs to rule out dehydration, UTI, electrolyte abnormalities.     _________________________ 2:12 PM on 09/09/2017 -----------------------------------------  imaging studies were no acute injuries. Patient found to have a UTI,  acute kidney injury. Given keflex, IVF and will be admitted to the Hospitalist service.    As part of my medical decision making, I reviewed the following data within the [place] notes reviewed and incorporated, Labs reviewed , EKG interpreted , Radiograph reviewed , Discussed with admitting physician , Notes from prior ED visits and [place] Controlled Substance Database    Pertinent labs & imaging results that were available during my care of the patient were reviewed by me and considered in my medical decision making (see chart for details).    ____________________________________________   FINAL CLINICAL IMPRESSION(S) / ED DIAGNOSES  Final diagnoses:  Fall, initial encounter  AKI (acute kidney injury) ([place])  Acute cystitis with hematuria  Bilateral hip pain  Abrasion of right shoulder, initial encounter      NEW MEDICATIONS STARTED DURING THIS  VISIT:  ED Discharge Orders    None       Note:  This document was prepared using Dragon voice recognition software and may include unintentional dictation errors.    Rudene Re, MD 09/09/17 [id number]

## 2017-09-09 NOTE — ED Notes (Signed)
Patient took oral medication without difficulty.

## 2017-09-09 NOTE — ED Notes (Signed)
Patient taken to imaging. 

## 2017-09-10 DIAGNOSIS — Z7189 Other specified counseling: Secondary | ICD-10-CM | POA: Diagnosis not present

## 2017-09-10 DIAGNOSIS — S40211A Abrasion of right shoulder, initial encounter: Secondary | ICD-10-CM | POA: Diagnosis not present

## 2017-09-10 DIAGNOSIS — N179 Acute kidney failure, unspecified: Secondary | ICD-10-CM | POA: Diagnosis not present

## 2017-09-10 DIAGNOSIS — M25552 Pain in left hip: Secondary | ICD-10-CM | POA: Diagnosis not present

## 2017-09-10 DIAGNOSIS — M25551 Pain in right hip: Secondary | ICD-10-CM | POA: Diagnosis not present

## 2017-09-10 DIAGNOSIS — N3001 Acute cystitis with hematuria: Secondary | ICD-10-CM

## 2017-09-10 LAB — CBC
HCT: 33.1 % — ABNORMAL LOW (ref 35.0–47.0)
Hemoglobin: 11.1 g/dL — ABNORMAL LOW (ref 12.0–16.0)
MCH: 35.4 pg — ABNORMAL HIGH (ref 26.0–34.0)
MCHC: 33.5 g/dL (ref 32.0–36.0)
MCV: 105.5 fL — AB (ref 80.0–100.0)
PLATELETS: 268 10*3/uL (ref 150–440)
RBC: 3.14 MIL/uL — ABNORMAL LOW (ref 3.80–5.20)
RDW: 14.7 % — AB (ref 11.5–14.5)
WBC: 8.6 10*3/uL (ref 3.6–11.0)

## 2017-09-10 LAB — BASIC METABOLIC PANEL
Anion gap: 8 (ref 5–15)
BUN: 27 mg/dL — AB (ref 6–20)
CHLORIDE: 117 mmol/L — AB (ref 101–111)
CO2: 18 mmol/L — ABNORMAL LOW (ref 22–32)
CREATININE: 1.74 mg/dL — AB (ref 0.44–1.00)
Calcium: 8.1 mg/dL — ABNORMAL LOW (ref 8.9–10.3)
GFR calc Af Amer: 29 mL/min — ABNORMAL LOW (ref >60)
GFR calc non Af Amer: 25 mL/min — ABNORMAL LOW (ref >60)
GLUCOSE: 84 mg/dL (ref 65–99)
Potassium: 3.7 mmol/L (ref 3.5–5.1)
SODIUM: 143 mmol/L (ref 135–145)

## 2017-09-10 MED ORDER — ENSURE ENLIVE PO LIQD
237.0000 mL | Freq: Three times a day (TID) | ORAL | Status: DC
  Administered 2017-09-10 – 2017-09-14 (×10): 237 mL via ORAL

## 2017-09-10 MED ORDER — HALOPERIDOL 1 MG PO TABS
1.0000 mg | ORAL_TABLET | Freq: Four times a day (QID) | ORAL | Status: DC | PRN
  Filled 2017-09-10: qty 1

## 2017-09-10 MED ORDER — HALOPERIDOL LACTATE 5 MG/ML IJ SOLN
1.0000 mg | Freq: Four times a day (QID) | INTRAMUSCULAR | Status: DC | PRN
  Administered 2017-09-10: 1 mg via INTRAMUSCULAR
  Filled 2017-09-10 (×2): qty 1

## 2017-09-10 NOTE — Plan of Care (Signed)
Morning BS check could not be accomplished. Even with extra hands - patient was agitated and aggressive. Unable to hold finger still long enough to get blood sample. Since baseline BS have been low - RN said it was ok to not get BS this AM.

## 2017-09-10 NOTE — Progress Notes (Signed)
New referral for [facility] received from [place] following a Palliative Medicine consult. Patient information faxed to referral. Hospital Liaison to follow up with patient and family tomorrow 12/7. May need hospital bed in place prior to discharge. [place] to advise. Flo Shanks RN, BSN, [place] and [facility] [place], hospital Liaison [phone]

## 2017-09-10 NOTE — Plan of Care (Signed)
Dr. Ree Kida that patient has lost IV access again.  Dr, informed ok to leave out for now and will re-assessed when rounding.

## 2017-09-10 NOTE — Consult Note (Signed)
Consultation Note Date: 09/10/2017   Patient Name: Tammie Mcmahon  DOB: Mar 25, 1928  MRN: [id number]  Age / Sex: 83 y.o., female  PCP: Kirk Ruths, MD Referring Physician: Nicholes Mango, MD  Reason for Consultation: Establishing goals of care  HPI/Patient Profile: Tammie Mcmahon  is a 83 y.o. female with a known history of essential hypertension, dementia, recurrent UTIs.  Patient fell last night and also this morning.  Patient complained of left hip pain and was brought to hosptial.  Patient complains of abdominal pain.  According to her niece patient had birthday party yesterday and she had few bites of cake  small amount of pizza.     Clinical Assessment and Goals of Care: Tammie Mcmahon is resting in bed. Sitter at bedside. She refuses food and water and removed her IV this morning, and a new IV was attempted but was unable to be placed due to agitation. She states "85" when asked her age, and stated "my parents" when asked where she lives. She says "my belly" when asked if she has pain.  Met with POA grandnephew Tammie Mcmahon, and patient's niece Tammie Mcmahon. Patient does not have husband or children.   They state Tammie Mcmahon was driving until March, and paying her own bills. Around that time, she was having trouble with the telephone and t.v. remote, and family would have to go help her, but family does state it was a new t.v. They state she fell in April and went to a facility, where she has been since. They state she had told someone she didn't have anything anymore and she was just done. Her niece feels like she has given up. She states she has noticed decline, and patient has had several falls. She states Ms. Maroney uses diapers now. She is ambulatory on a walker, but they are afraid she will fall.   We discussed diagnosis, prognosis, GOC, and options. Natural trajectory and expectations at EOL were discussed. A  detailed discussion was had today regarding advanced directives.  Concepts specific to code status, artifical feeding and hydration,  IV antibiotics and rehospitalization were discussed.  The difference between an aggressive medical intervention path  and a hospice comfort care path for this patient was discussed.  Values and goals of care important to patient and family were discussed.  [place] nephew states it is difficult to make decisions for someone else and that they had not ever really had conversations about decisions.  Grand nephew states she has had a UTI for the past year, and that if it were treated her mental status may improve, and that they would know exactly what level her dementia was at. Her grand nephew requests that she be rehydrated with IVF; Tammie Mcmahon niece will stay to help patient maintain IV. He states that he does not want IVF again after discharge, but would like it just to give her a chance.   MOST form completed. Section B not completed.  They would like DNR/DNI status. They are okay with antibiotics that are  available at the facility, they do not want her transferred back to the hospital as that is stressful for her. They do not want a feeding tube placed, and do not want IVF following this hospitalization.   Concepts of Hospice and Palliative Care were discussed. Tammie Mcmahon states Hospice has already been approved for Tammie Mcmahon at her facility and are supposed to begin seeing her. He would like for her not to be transported back and forth to the hospital but is reluctant regarding hospice because he is worried if she falls and needs stitches or there is concern for injury, that she would not be transported to the hospital.      Tammie Mcmahon is decision maker and per himself and niece, he is POA.     SUMMARY OF RECOMMENDATIONS    POA states Hospice has already been approved for Tammie Mcmahon at her SNF facility. Recommend further conversations with them.   Family would like IVF  during this hospitalization for rehydration and would like antibiotics if able, and at her facility as able by them.    Code Status/Advance Care Planning:  DNR   Palliative Prophylaxis:   Aspiration and Oral Care  Additional Recommendations (Limitations, Scope, Preferences):  Avoid Hospitalization and No Artificial Feeding  Prognosis:   < 6 months Dementia, falls including one leading to rib fracture currently, bites and sips over the past couple of weeks, not eating or drinking here, agitated and not allowing IVF. CHF, CKD, A-fib.   Discharge Planning: [place] with Hospice      Primary Diagnoses: Present on Admission: . Acute kidney injury ([place])   I have reviewed the medical record, interviewed the patient and family, and examined the patient. The following aspects are pertinent.  Past Medical History:  Diagnosis Date  . (HFpEF) heart failure with preserved ejection fraction ([place])   . Atrial fibrillation and flutter ([place])   . Breast cancer ([place])   . CKD (chronic kidney disease), stage III ([place] [address])   . Hyperlipidemia   . Hypertension   . Low back pain   . Menopausal and postmenopausal disorder   . PA (pernicious anemia)   . Thyroid disease   . Valvular heart disease    Social History   Socioeconomic History  . Marital status: Widowed    Spouse name: None  . Number of children: None  . Years of education: None  . Highest education level: None  Social Needs  . Financial resource strain: None  . Food insecurity - worry: None  . Food insecurity - inability: None  . Transportation needs - medical: None  . Transportation needs - non-medical: None  Occupational History  . None  Tobacco Use  . Smoking status: Never Smoker  . Smokeless tobacco: Former Systems developer    Types: Snuff  Substance and Sexual Activity  . Alcohol use: No  . Drug use: No  . Sexual activity: None  Other Topics Concern  . None  Social History Narrative  . None   Family  History  Problem Relation Age of Onset  . Heart attack Father    Scheduled Meds: . amiodarone  200 mg Oral Daily  . aspirin  81 mg Oral Daily  . cephALEXin  500 mg Oral QID  . cholecalciferol  1,000 Units Oral Daily  . docusate sodium  100 mg Oral BID  . DULoxetine  60 mg Oral Daily  . heparin  5,000 Units Subcutaneous Q8H  . levothyroxine  88 mcg Oral QAC breakfast  .  pantoprazole  40 mg Oral Daily  . pneumococcal 23 valent vaccine  0.5 mL Intramuscular Tomorrow-1000  . risperiDONE  0.5 mg Oral BID   Continuous Infusions: . sodium chloride 75 mL/hr at 09/09/17 1638  . ciprofloxacin Stopped (09/09/17 2048)   PRN Meds:.acetaminophen **OR** acetaminophen, azelastine, bisacodyl, haloperidol **OR** haloperidol lactate, haloperidol lactate, magnesium hydroxide, morphine injection, ondansetron **OR** ondansetron (ZOFRAN) IV, oxyCODONE-acetaminophen, traZODone Medications Prior to Admission:  Prior to Admission medications   Medication Sig Start Date End Date Taking? Authorizing Provider  acetaminophen (TYLENOL 8 HOUR ARTHRITIS PAIN) 650 MG CR tablet Take 650-1,300 mg by mouth 2 (two) times daily. (0800 & 2000)    Yes [provider]  amiodarone (PACERONE) 200 MG tablet Take 200 mg by mouth daily. (0800)   Yes [provider]  aspirin 81 MG chewable tablet Chew 81 mg by mouth daily. (0800)   Yes [provider]  cholecalciferol (VITAMIN D) 1000 units tablet Take 1,000 Units by mouth daily. (0800)   Yes [provider]  ciprofloxacin (CIPRO) 500 MG tablet Take 500 mg by mouth daily. 09/06/17 09/11/17 Yes [provider]  DULoxetine (CYMBALTA) 60 MG capsule Take 60 mg by mouth daily. (0800)   Yes [provider]  levothyroxine (SYNTHROID, LEVOTHROID) 88 MCG tablet Take 88 mcg by mouth daily before breakfast. 30-60 minutes before breakfast   Yes [provider]  lidocaine (LIDODERM) 5 % Place 1 patch onto the skin daily. Remove &  Discard patch within 12 hours or as directed by MD ON at 0800, & OFF at 2000   Yes [provider]  lisinopril (PRINIVIL,ZESTRIL) 5 MG tablet Take 1 tablet (5 mg total) by mouth daily. 02/14/17  Yes Sudini, Alveta Heimlich, MD  omeprazole (PRILOSEC) 20 MG capsule Take 20 mg by mouth daily. (0700)   Yes [provider]  polyethylene glycol (MIRALAX / GLYCOLAX) packet Take 17 g by mouth daily as needed for moderate constipation. Patient taking differently: Take 17 g by mouth daily.  01/07/17  Yes Max Sane, MD  potassium chloride (MICRO-K) 10 MEQ CR capsule Take 10 mEq by mouth daily. (0800) 04/10/14  Yes [provider]  pravastatin (PRAVACHOL) 40 MG tablet Take 40 mg by mouth at bedtime. (2000) 05/19/14  Yes [provider]  senna (SENOKOT) 8.6 MG TABS tablet Take 1 tablet (8.6 mg total) by mouth 2 (two) times daily. 01/07/17  Yes Max Sane, MD  azelastine (ASTELIN) 0.1 % nasal spray Place 1 spray into both nostrils daily as needed for allergies.  09/30/16   [provider]  cephALEXin (KEFLEX) 500 MG capsule Take 1 capsule (500 mg total) by mouth 4 (four) times daily for 10 days. Patient not taking: Reported on 09/09/2017 09/01/17 09/11/17  Eula Listen, MD  diclofenac sodium (VOLTAREN) 1 % GEL Apply 2 g topically 4 (four) times daily as needed (FOR PAIN).     [provider]  HYDROcodone-acetaminophen (NORCO) 5-325 MG tablet Take 1 tablet by mouth every 6 (six) hours as needed for moderate pain. 08/04/17   Hessie Knows, MD  magnesium hydroxide (MILK OF MAGNESIA) 400 MG/5ML suspension Take 30 mLs by mouth every other day as needed (for constipation).    [provider]  Nutritional Supplements (NUTRITIONAL SHAKE PO) Take 1 Bottle by mouth 3 (three) times daily.    [provider]   No Known Allergies Review of Systems  Gastrointestinal: Positive for abdominal pain.    Physical Exam  Constitutional:  Agitated  HENT:  Head:  Normocephalic.  Pulmonary/Chest: Effort normal.  Neurological: She is alert.  Confused  Skin: Skin is warm and dry.    Vital Signs: BP (!) 140/49 (BP Location: Left Arm)   Pulse 69   Temp 98 F (36.7 C) (Oral)   Resp 19   Ht _0  (1.575 m)   Wt 50.9 kg (112 lb 3.2 oz)   SpO2 100%   BMI 20.52 kg/m  Pain Assessment: PAINAD       SpO2: SpO2: (unable to get ) O2 Device:SpO2: (unable to get ) O2 Flow Rate: .   IO: Intake/output summary:   Intake/Output Summary (Last 24 hours) at 09/10/2017 1208 Last data filed at 09/10/2017 0000 Gross per 24 hour  Intake 728.75 ml  Output -  Net 728.75 ml    LBM: Last BM Date: (unknown) Baseline Weight: Weight: 50.1 kg (110 lb 6.4 oz) Most recent weight: Weight: 50.9 kg (112 lb 3.2 oz)     Palliative Assessment/Data: 40%     Time In: 11:00 Time Out: 12:40 Time Total: 1 hour 40 minutes Greater than 50%  of this time was spent counseling and coordinating care related to the above assessment and plan.  Signed by: Asencion Gowda, NP   Please contact Palliative Medicine Team phone at [phone] for questions and concerns.  For individual provider: See Shea Evans

## 2017-09-10 NOTE — Progress Notes (Signed)
[facility] NAME: Tammie Mcmahon    MR#:  [id number]  DATE OF BIRTH:  Jun 13, 1928  SUBJECTIVE:  CHIEF COMPLAINT:  Patient is delirious today, pulled her IV line, agitated Received IM Haldol  REVIEW OF SYSTEMS:  Review of systems unobtainable  DRUG ALLERGIES:  No Known Allergies  VITALS:  Blood pressure (!) 140/49, pulse 69, temperature 98 F (36.7 C), temperature source Oral, resp. rate 19, height 5\' 2"  (1.575 m), weight 50.9 kg (112 lb 3.2 oz), SpO2 100 %.  PHYSICAL EXAMINATION:  GENERAL:  83 y.o.-year-old patient lying in the bed with no acute distress.  EYES: Pupils equal, round, reactive to light and accommodation. No scleral icterus. Extraocular muscles intact.  HEENT: Head atraumatic, normocephalic. Oropharynx and nasopharynx clear.  NECK:  Supple, no jugular venous distention. No thyroid enlargement, no tenderness.  LUNGS: Normal breath sounds bilaterally, no wheezing, rales,rhonchi or crepitation. No of accessory muscles of respiration.  CARDIOVASCULAR: S1, S2 normal. No murmurs, rubs, or gallops.  ABDOMEN: Soft, nontender, nondistended. Bowel sounds present.  EXTREMITIES: No pedal edema, cyanosis, or clubbing.  NEUROLOGIC: delerious PSYCHIATRIC: The patient is delerious  SKIN: No obvious rash, lesion, or ulcer.    LABORATORY PANEL:   CBC Recent Labs  Lab 09/10/17 0420  WBC 8.6  HGB 11.1*  HCT 33.1*  PLT 268   ------------------------------------------------------------------------------------------------------------------  Chemistries  Recent Labs  Lab 09/10/17 0420  NA 143  K 3.7  CL 117*  CO2 18*  GLUCOSE 84  BUN 27*  CREATININE 1.74*  CALCIUM 8.1*   ------------------------------------------------------------------------------------------------------------------  Cardiac Enzymes Recent Labs  Lab 09/09/17 1303  TROPONINI 0.06*    ------------------------------------------------------------------------------------------------------------------  RADIOLOGY:  Dg Thoracic Spine 2 View  Result Date: 09/09/2017 CLINICAL DATA:  Patient sustained a fall. Patient had difficulty cooperating with positioning. EXAM: THORACIC SPINE 2 VIEWS COMPARISON:  Thoracic spine series dated June 23, 2017 FINDINGS: The patient has undergone interval kyphoplasty of a T11 fracture. The other thoracic vertebral bodies are reasonably well maintained in height. There is prominent kyphosis and there is moderate curvature convex toward the left centered at T11. There is dense calcification in the wall of the aortic arch. IMPRESSION: Stable degenerative and post traumatic changes of the thoracic and upper lumbar spine. Interval kyphoplasty of T11. Thoracic aortic atherosclerosis. Electronically Signed   By: David  Martinique M.D.   On: 09/09/2017 13:01   Dg Shoulder Right  Result Date: 09/09/2017 CLINICAL DATA:  83 year old female presenting with pain after fall EXAM: RIGHT SHOULDER - 2+ VIEW COMPARISON:  01/05/2017 CXR FINDINGS: There is a displaced right lateral fifth rib fracture without significant callus formation suggesting a recent fracture. No pneumothorax or pulmonary contusion. The [facility] and glenohumeral joints are intact with mild degenerative joint space narrowing of the glenohumeral joint superiorly. No fracture noted about the glenohumeral nor AC joints. IMPRESSION: 1. Recent/acute appearing right lateral fifth rib fracture with displacement. No associated findings of pneumothorax or pulmonary contusion. No pleural effusion. 2. Osteoarthritis of the glenohumeral joint. Electronically Signed   By: Ashley Royalty M.D.   On: 09/09/2017 13:14   Dg Ankle 2 Views Right  Result Date: 09/09/2017 CLINICAL DATA:  Fall.  Right ankle pain. EXAM: RIGHT ANKLE - 2 VIEW COMPARISON:  None. FINDINGS: The ankle is located. No acute bone or soft tissue abnormalities  are present. Mild degenerative changes are noted. IMPRESSION: Mild degenerative changes without acute fracture. Electronically Signed   By: Wynetta Fines.D.  On: 09/09/2017 13:04   Ct Head Wo Contrast  Result Date: 09/09/2017 CLINICAL DATA:  Unwitnessed fall last night. EXAM: CT HEAD WITHOUT CONTRAST CT CERVICAL SPINE WITHOUT CONTRAST TECHNIQUE: Multidetector CT imaging of the head and cervical spine was performed following the standard protocol without intravenous contrast. Multiplanar CT image reconstructions of the cervical spine were also generated. COMPARISON:  07/26/2017 FINDINGS: CT HEAD FINDINGS Brain: There is atrophy and chronic small vessel disease changes. Physiologic calcifications in the basal ganglia, stable. No acute intracranial abnormality. Specifically, no hemorrhage, hydrocephalus, mass lesion, acute infarction, or significant intracranial injury. Vascular: No hyperdense vessel or unexpected calcification. Skull: No acute calvarial abnormality. Sinuses/Orbits: Visualized paranasal sinuses and mastoids clear. Orbital soft tissues unremarkable. Other: None CT CERVICAL SPINE FINDINGS Alignment: Normal Skull base and vertebrae: No fractures Soft tissues and spinal canal: Prevertebral soft tissues are normal. No epidural or paraspinal hematoma. Disc levels: Diffuse degenerative disc disease with disc space narrowing. Mild posterior spurring. Mild diffuse degenerative facet disease. Upper chest: No acute findings. Other: None IMPRESSION: No acute intracranial abnormality.Atrophy, chronic microvascular disease. Degenerative changes in the cervical spine. No acute bony abnormality. Electronically Signed   By: Rolm Baptise M.D.   On: 09/09/2017 12:43   Ct Cervical Spine Wo Contrast  Result Date: 09/09/2017 CLINICAL DATA:  Unwitnessed fall last night. EXAM: CT HEAD WITHOUT CONTRAST CT CERVICAL SPINE WITHOUT CONTRAST TECHNIQUE: Multidetector CT imaging of the head and cervical spine was  performed following the standard protocol without intravenous contrast. Multiplanar CT image reconstructions of the cervical spine were also generated. COMPARISON:  07/26/2017 FINDINGS: CT HEAD FINDINGS Brain: There is atrophy and chronic small vessel disease changes. Physiologic calcifications in the basal ganglia, stable. No acute intracranial abnormality. Specifically, no hemorrhage, hydrocephalus, mass lesion, acute infarction, or significant intracranial injury. Vascular: No hyperdense vessel or unexpected calcification. Skull: No acute calvarial abnormality. Sinuses/Orbits: Visualized paranasal sinuses and mastoids clear. Orbital soft tissues unremarkable. Other: None CT CERVICAL SPINE FINDINGS Alignment: Normal Skull base and vertebrae: No fractures Soft tissues and spinal canal: Prevertebral soft tissues are normal. No epidural or paraspinal hematoma. Disc levels: Diffuse degenerative disc disease with disc space narrowing. Mild posterior spurring. Mild diffuse degenerative facet disease. Upper chest: No acute findings. Other: None IMPRESSION: No acute intracranial abnormality.Atrophy, chronic microvascular disease. Degenerative changes in the cervical spine. No acute bony abnormality. Electronically Signed   By: Rolm Baptise M.D.   On: 09/09/2017 12:43   Dg Hip Unilat With Pelvis 2-3 Views Left  Result Date: 09/09/2017 CLINICAL DATA:  83 year old female presenting with left hip pain after fall. EXAM: DG HIP (WITH OR WITHOUT PELVIS) 2-3V LEFT COMPARISON:  01/04/2017 FINDINGS: There is no evidence of hip fracture or dislocation. Stable mild degenerative joint space narrowing of the left hip. The included bony pelvis appears intact. IMPRESSION: Stable mild joint space narrowing of the left hip, degenerative in etiology. No acute fracture or malalignment. Electronically Signed   By: Ashley Royalty M.D.   On: 09/09/2017 13:08   Dg Hip Unilat With Pelvis 2-3 Views Right  Result Date: 09/09/2017 CLINICAL  DATA:  83 year old female presenting with right hip pain after fall. EXAM: DG HIP (WITH OR WITHOUT PELVIS) 2-3V RIGHT COMPARISON:  05/20/2017 radiographs FINDINGS: There is no evidence of hip fracture or dislocation. Lower lumbar degenerative disc and facet arthropathy of the included lumbar spine from L4 through S1. The arcuate lines of the sacrum appear grossly intact. No acute pelvic fracture. No suspicious osseous lesions. Degenerative joint space narrowing  is identified of both hips. Bilateral iliofemoral arteriosclerosis. IMPRESSION: Mild degenerative joint space narrowing of both hips. No acute fracture is identified. Electronically Signed   By: Ashley Royalty M.D.   On: 09/09/2017 13:04    EKG:   Orders placed or performed during the hospital encounter of 09/09/17  . ED EKG  . ED EKG  . EKG 12-Lead  . EKG 12-Lead    ASSESSMENT AND PLAN:   83 year old female patient with multiple medical problems of hypertension, hyperlipidemia, dementia came in from nursing home because of multiple falls, left hip pain.  1.  Left hip pain likely musculoskeletal patient has no evidence of fracture on x-ray. Pain management as tolerated  2.  Recurrent falls secondary to dementia.  Patient has no evidence of intracranial abnormality, CT spine also use unremarkable.  Get physical therapy evaluation.  3.  Acute renal injury likely due to dehydration, patient not eating and drinking well for the past few weeks.   Continue gentle hydration.   avoid ACE inhibitors and other nephrotoxins  4.  Acute fractures on the right fifth ribs: Secondary to fall, patient complains of mild abdominal pain likely coming from the fractures.   Continue incentive spirometry, pain medicines.  5 dementia, depression: Continue Cymbalta.  6.  History of chronic atrial fibrillation: Rate controlled, continue amiodarone  7.  Due to multiple medical problems, dementia patient physician Dr. Ouida Sills requested hospice to follow at  nursing home Palliative care is following. Family is agreeable with the hospice care at the facility  8 UTI: Patient recent urine cultures showed Pseudomonas UTI more than 100,000 colonies from November 30 culture results.  Patient was Keflex but now she is on Cipro continue Cipro   d/c keflex.       All the records are reviewed and case discussed with Care Management/Social Workerr. Management plans discussed with the patient's niece at bed side and she is in agreement.  CODE STATUS: dnr  TOTAL TIME TAKING CARE OF THIS PATIENT: 36  minutes.   POSSIBLE D/C IN 1-2  DAYS, DEPENDING ON CLINICAL CONDITION.  Note: This dictation was prepared with Dragon dictation along with smaller phrase technology. Any transcriptional errors that result from this process are unintentional.   Nicholes Mango M.D on 09/10/2017 at 3:00 PM  Between 7am to 6pm - Pager - [phone] After 6pm go to [email or web address] - password [email or web address] [facility]  [facility]  Office  [phone]  CC: Primary care physician; Kirk Ruths, MD

## 2017-09-10 NOTE — Progress Notes (Signed)
Pt remove her IV early in shift and IV was replaced and wrapped. Pt remove second IV late in shift. An attempt was made to insert another IV but pt is agitated and moved her arm and blow vain. Pt will not remain still enough to do vitals this am. No other signs of distress noted. Will continue to monitor

## 2017-09-10 NOTE — Clinical Social Work Note (Addendum)
CSW was informed that [facility] has made a referral to [facility].  CSW spoke to [place] at [employer] [facility] who confirmed they do have a referral to [facility].  CSW informed [facility] nurse liaison who is aware of referral that has been made.  Nurse liaison was informed by this CSW that [facility] said they will send referral to [facility].  CSW was asked by [facility] if patient will need hospital bed, CSW asked [facility], and they will call this CSW back after they make decision.  CSW also spoke with patient's nephew Tammie Mcmahon, who confirmed that [facility] are the choice of provider at ALF.   Tammie Mcmahon. Norval Morton, MSW, [place]  09/10/2017 4:36 PM

## 2017-09-10 NOTE — Plan of Care (Signed)
Patient spitting out all food and refused all breakfast tray.  Unable to sip through straw d/t AMS. Morning meds crushed and placed in applesauce, but still unable to give patient (since she continued to spit them out.)

## 2017-09-10 NOTE — Progress Notes (Signed)
Initial Nutrition Assessment  DOCUMENTATION CODES:   Severe malnutrition in context of chronic illness  INTERVENTION:   Recommend monitor Mg, K, and P as pt at high refeeding risk  Ensure Enlive po BID, each supplement provides 350 kcal and 20 grams of protein  Vital Cuisine BID, each supplement provides 520kcal and 22g of protein.   Bowel regimen as needed  NUTRITION DIAGNOSIS:   Severe Malnutrition related to chronic illness(CHF, CKD, DM, dementia, advanced age ) as evidenced by 77 percent weight loss in 9 months, moderate fat depletion, severe muscle depletion.  GOAL:   Patient will meet greater than or equal to 90% of their needs  MONITOR:   PO intake, Supplement acceptance, Labs, Weight trends, I & O's, Skin  REASON FOR ASSESSMENT:   Malnutrition Screening Tool    ASSESSMENT:   83 year old female patient with multiple medical problems of hypertension, hyperlipidemia, dementia came in from nursing home because of multiple falls, rib fractures, and left hip pain.  Visited pt's room today. Unable to obtain nutrition related history as pt with dementia. History obtained from sitter and pt's family at bedside. Per family, pt with poor appetite and oral intake a baseline. Pt currently eating only bites of meals and today has been agitated and spitting food out. Per chart, pt has lost 56lbs(33%) in 8 months; this is severe. Pt noted to have muscle and fat depletions and bruising over entire body. Pt is likely with multiple nutrient deficiencies. Pt would likely need PEG tube placement in order to meet her estimated needs; per palliative care note, family does not want artifical feeding. RD will order supplements for pt and family comfort. Pt likely at high refeeding risk; recommend monitor K, P, and Mg.     Medications reviewed and include: aspirin, vitamin D, colace, heparin, synthroid, protonix, ciprofloxacin  Labs reviewed: Cl 117(H), BUN 27(H), creat 1.74(H), Ca  8.1(L)  Nutrition-Focused physical exam completed. Findings are moderate fat depletions in arms and chest, moderate to severe muscle depletions over entire body, and no edema. Pt noted to have bruising over entire body.   Diet Order:  DIET SOFT Room service appropriate? Yes; Fluid consistency: Thin  EDUCATION NEEDS:   Not appropriate for education at this time  Skin:  Reviewed RN Assessment  Last BM:  PTA  Height:   Ht Readings from Last 1 Encounters:  09/09/17 5\' 2"  (1.575 m)    Weight:   Wt Readings from Last 1 Encounters:  09/10/17 112 lb 3.2 oz (50.9 kg)    Ideal Body Weight:  50 kg  BMI:  Body mass index is 20.52 kg/m.  Estimated Nutritional Needs:   Kcal:  1300-1500kcal/day   Protein:  66-76g/day   Fluid:  >1.5L/day   Koleen Distance MS, RD, LDN Pager #[phone] After Hours Pager: [phone]

## 2017-09-11 DIAGNOSIS — M25551 Pain in right hip: Secondary | ICD-10-CM | POA: Diagnosis not present

## 2017-09-11 DIAGNOSIS — N3001 Acute cystitis with hematuria: Secondary | ICD-10-CM | POA: Diagnosis not present

## 2017-09-11 DIAGNOSIS — M25552 Pain in left hip: Secondary | ICD-10-CM

## 2017-09-11 DIAGNOSIS — Z7189 Other specified counseling: Secondary | ICD-10-CM | POA: Diagnosis not present

## 2017-09-11 DIAGNOSIS — N179 Acute kidney failure, unspecified: Secondary | ICD-10-CM | POA: Diagnosis not present

## 2017-09-11 LAB — CBC
HCT: 32.7 % — ABNORMAL LOW (ref 35.0–47.0)
HEMOGLOBIN: 10.8 g/dL — AB (ref 12.0–16.0)
MCH: 35 pg — ABNORMAL HIGH (ref 26.0–34.0)
MCHC: 33.1 g/dL (ref 32.0–36.0)
MCV: 105.6 fL — ABNORMAL HIGH (ref 80.0–100.0)
Platelets: 311 10*3/uL (ref 150–440)
RBC: 3.1 MIL/uL — AB (ref 3.80–5.20)
RDW: 15 % — ABNORMAL HIGH (ref 11.5–14.5)
WBC: 11 10*3/uL (ref 3.6–11.0)

## 2017-09-11 LAB — BASIC METABOLIC PANEL
ANION GAP: 9 (ref 5–15)
BUN: 32 mg/dL — ABNORMAL HIGH (ref 6–20)
CALCIUM: 8.1 mg/dL — AB (ref 8.9–10.3)
CO2: 18 mmol/L — ABNORMAL LOW (ref 22–32)
Chloride: 119 mmol/L — ABNORMAL HIGH (ref 101–111)
Creatinine, Ser: 1.64 mg/dL — ABNORMAL HIGH (ref 0.44–1.00)
GFR, EST AFRICAN AMERICAN: 31 mL/min — AB (ref >60)
GFR, EST NON AFRICAN AMERICAN: 27 mL/min — AB (ref >60)
Glucose, Bld: 102 mg/dL — ABNORMAL HIGH (ref 65–99)
Potassium: 4.1 mmol/L (ref 3.5–5.1)
Sodium: 146 mmol/L — ABNORMAL HIGH (ref 135–145)

## 2017-09-11 LAB — URINE CULTURE
CULTURE: NO GROWTH
Special Requests: NORMAL

## 2017-09-11 LAB — GLUCOSE, CAPILLARY: GLUCOSE-CAPILLARY: 94 mg/dL (ref 65–99)

## 2017-09-11 MED ORDER — PANTOPRAZOLE SODIUM 40 MG PO PACK
40.0000 mg | PACK | Freq: Every day | ORAL | Status: DC
  Administered 2017-09-11 – 2017-09-14 (×3): 40 mg via ORAL
  Filled 2017-09-11 (×5): qty 20

## 2017-09-11 MED ORDER — SODIUM CHLORIDE 0.9 % IV BOLUS (SEPSIS)
500.0000 mL | Freq: Once | INTRAVENOUS | Status: AC
  Administered 2017-09-11: 12:00:00 500 mL via INTRAVENOUS

## 2017-09-11 MED ORDER — RISPERIDONE 0.5 MG PO TABS
0.5000 mg | ORAL_TABLET | Freq: Two times a day (BID) | ORAL | Status: DC
  Filled 2017-09-11: qty 1

## 2017-09-11 MED ORDER — RISPERIDONE 1 MG PO TABS
1.0000 mg | ORAL_TABLET | Freq: Two times a day (BID) | ORAL | Status: DC
  Administered 2017-09-11: 1 mg via ORAL
  Administered 2017-09-11: 10:00:00 0.5 mg via ORAL
  Administered 2017-09-12 – 2017-09-14 (×5): 1 mg via ORAL
  Filled 2017-09-11 (×11): qty 1

## 2017-09-11 MED ORDER — RISPERIDONE 0.5 MG PO TBDP
0.5000 mg | ORAL_TABLET | ORAL | Status: DC
  Filled 2017-09-11: qty 1

## 2017-09-11 NOTE — Plan of Care (Deleted)
Spoke with family and Dr. Margaretmary Eddy in hall following Dr. Rinaldo Ratel conversation with them. They state per discussion, patient will remain with IVF today to give her a chance to improve. Assessment tomorrow of patient's status and plans, most likely SNF with hospice vs inpatient hospice facility. Patient has had mittens and sitter in place. She ate half of a magic cup this morning. Patient has been agitated, Risperdal dose increased to 1mg  BID.

## 2017-09-11 NOTE — Progress Notes (Signed)
Pharmacy Antibiotic Note  Tammie Mcmahon is a 83 y.o. female admitted on 09/09/2017 with UTI.  Pharmacy has been consulted for cipro dosing.  Hx of pseudomonas UTI on 09/01/17  Plan: Ciprofloxacin 400mg  iv q24h  Estimated Creatinine Clearance: 19.1 mL/min (A) (by C-G formula based on SCr of 1.64 mg/dL (H)).   Height: 5\' 2"  (157.5 cm) Weight: 111 lb 14.4 oz (50.8 kg) IBW/kg (Calculated) : 50.1  Temp (24hrs), Avg:98.1 F (36.7 C), Min:97.3 F (36.3 C), Max:99 F (37.2 C)  Recent Labs  Lab 09/09/17 1303 09/10/17 0420 09/11/17 0518  WBC 10.0 8.6 11.0  CREATININE 2.01* 1.74* 1.64*    Estimated Creatinine Clearance: 19.1 mL/min (A) (by C-G formula based on SCr of 1.64 mg/dL (H)).    No Known Allergies  Antimicrobials this admission: Anti-infectives (From admission, onward)   Start     Dose/Rate Route Frequency Ordered Stop   09/09/17 1800  cephALEXin (KEFLEX) capsule 500 mg  Status:  Discontinued     500 mg Oral 4 times daily 09/09/17 1425 09/10/17 1414   09/09/17 1515  ciprofloxacin (CIPRO) IVPB 400 mg     400 mg 200 mL/hr over 60 Minutes Intravenous Every 24 hours 09/09/17 1512        Microbiology results: Recent Results (from the past 240 hour(s))  Urine culture     Status: Abnormal   Collection Time: 09/01/17  6:13 PM  Result Value Ref Range Status   Specimen Description URINE, CLEAN CATCH  Final   Special Requests NONE  Final   Culture >=100,000 COLONIES/mL PSEUDOMONAS AERUGINOSA (A)  Final   Report Status 09/04/2017 FINAL  Final   Organism ID, Bacteria PSEUDOMONAS AERUGINOSA (A)  Final      Susceptibility   Pseudomonas aeruginosa - MIC*    CEFTAZIDIME 8 SENSITIVE Sensitive     CIPROFLOXACIN <=0.25 SENSITIVE Sensitive     GENTAMICIN 4 SENSITIVE Sensitive     IMIPENEM 2 SENSITIVE Sensitive     PIP/TAZO 8 SENSITIVE Sensitive     CEFEPIME 4 SENSITIVE Sensitive     * >=100,000 COLONIES/mL PSEUDOMONAS AERUGINOSA  MRSA PCR Screening     Status: None   Collection Time: 09/09/17  4:43 PM  Result Value Ref Range Status   MRSA by PCR NEGATIVE NEGATIVE Final    Comment:        The GeneXpert MRSA Assay (FDA approved for NASAL specimens only), is one component of a comprehensive MRSA colonization surveillance program. It is not intended to diagnose MRSA infection nor to guide or monitor treatment for MRSA infections.      Thank you for allowing pharmacy to be a part of this patient's care.  Cadence Minton A 09/11/2017 10:54 AM

## 2017-09-11 NOTE — Progress Notes (Addendum)
Daily Progress Note   Patient Name: Tammie Mcmahon       Date: 09/11/2017 DOB: [birth year]  Age: 83 y.o. MRN#: [id number] Attending Physician: Nicholes Mango, MD Primary Care Physician: Kirk Ruths, MD Admit Date: 09/09/2017  Reason for Consultation/Follow-up: Establishing goals of care  Subjective: Spoke with family and Dr. Margaretmary Eddy in hall following Dr. Rinaldo Ratel conversation with them. They state per discussion, patient will remain with IVF today to give her a chance to improve. Assessment tomorrow of patient's status and plans, most likely SNF with hospice vs inpatient hospice facility. Patient has had mittens and sitter in place. She ate half of a magic cup this morning. Patient has been agitated, Risperdal initiated by primary team, dose increased to 25m BID this morning. Patient met today by hospice liason KMarcene Brawn   Length of Stay: 2  Current Medications: Scheduled Meds:  . amiodarone  200 mg Oral Daily  . aspirin  81 mg Oral Daily  . cholecalciferol  1,000 Units Oral Daily  . docusate sodium  100 mg Oral BID  . DULoxetine  60 mg Oral Daily  . feeding supplement (ENSURE ENLIVE)  237 mL Oral TID BM  . heparin  5,000 Units Subcutaneous Q8H  . levothyroxine  88 mcg Oral QAC breakfast  . pantoprazole sodium  40 mg Oral Daily  . pneumococcal 23 valent vaccine  0.5 mL Intramuscular Tomorrow-1000  . risperiDONE  1 mg Oral BID    Continuous Infusions: . sodium chloride 125 mL/hr at 09/11/17 1226  . sodium chloride 500 mL (09/11/17 1154)    PRN Meds: acetaminophen **OR** acetaminophen, azelastine, bisacodyl, haloperidol **OR** haloperidol lactate, haloperidol lactate, magnesium hydroxide, morphine injection, ondansetron **OR** ondansetron (ZOFRAN) IV, oxyCODONE-acetaminophen,  traZODone  Physical Exam  Constitutional:  Being fed magic cup. Mittens in place.  HENT:  Head: Atraumatic.  Pulmonary/Chest: Effort normal.  Neurological: She is alert.  Confused.             Vital Signs: BP (!) 139/56 (BP Location: Left Arm)   Pulse 80   Temp 98 F (36.7 C) (Axillary)   Resp 16   Ht 5' 2" (1.575 m)   Wt 50.8 kg (111 lb 14.4 oz)   SpO2 94%   BMI 20.47 kg/m  SpO2: SpO2: 94 % O2 Device: O2 Device: Nasal Cannula O2  Flow Rate: O2 Flow Rate (L/min): 2 L/min  Intake/output summary:   Intake/Output Summary (Last 24 hours) at 09/11/2017 1236 Last data filed at 09/10/2017 1904 Gross per 24 hour  Intake 0 ml  Output -  Net 0 ml   LBM: Last BM Date: (unknown) Baseline Weight: Weight: 50.1 kg (110 lb 6.4 oz) Most recent weight: Weight: 50.8 kg (111 lb 14.4 oz)       Palliative Assessment/Data:      Patient Active Problem List   Diagnosis Date Noted  . Acute kidney injury ([place]) 09/09/2017  . Fall 02/13/2017  . Syncope 01/04/2017  . Lower abdominal pain 01/04/2017  . CKD (chronic kidney disease), stage III ([place]) 01/04/2017  . Elevated troponin 01/04/2017  . Hypokalemia 01/04/2017  . Leukocytosis 01/04/2017  . Dysuria 01/04/2017  . Constipation 01/04/2017  . Symptomatic bradycardia 10/22/2016  . Atrial fibrillation ([place]) 10/22/2016  . Heart failure with preserved ejection fraction ([place]) 10/22/2016  . Hypertension 10/22/2016  . Hyperlipidemia 10/22/2016  . Acute on chronic renal failure ([place]) 10/22/2016  . Other specified cardiac arrhythmias (CODE) 10/16/2016  . Confusion 09/10/2016    Palliative Care Assessment & Plan   Patient Profile: [name] a89 y.o.femalewith a known history of essential hypertension, dementia, recurrent UTIs. Patient fell last night and also this morning. Patient complained of left hip pain and was brought to hosptial. Patient complains of abdominal pain. According to her niece patient had birthday party  yesterday and she had few bites of cakesmall amount of pizza.   Assessment: Patient sitting in bed eating magic cup. Urine culture negative, abx stopped per primary team.   Recommendations/Plan:  Continuing IVF today. Primary team to revaluate tomorrow for likely Hospice inpatient. Possibly hospice at facility.     Code Status:    Code Status Orders  (From admission, onward)        Start     Ordered   09/10/17 1254  Do not attempt resuscitation (DNR)  Continuous    Question Answer Comment  In the event of cardiac or respiratory ARREST Do not call a "code blue"   In the event of cardiac or respiratory ARREST Do not perform Intubation, CPR, defibrillation or ACLS   In the event of cardiac or respiratory ARREST Use medication by any route, position, wound care, and other measures to relive pain and suffering. May use oxygen, suction and manual treatment of airway obstruction as needed for comfort.      09/10/17 1253    Code Status History    Date Active Date Inactive Code Status Order ID Comments User Context   09/09/2017 14:25 09/10/2017 12:53 Full Code [id number]  Epifanio Lesches, MD ED   02/13/2017 13:21 02/14/2017 17:31 Full Code [id number]  Dustin Flock, MD Inpatient   01/04/2017 16:14 01/07/2017 19:51 Full Code [id number]  Theodoro Grist, MD Inpatient   10/22/2016 02:28 10/23/2016 18:36 Full Code [id number]  Lance Coon, MD ED   10/16/2016 15:47 10/18/2016 15:25 Full Code [id number]  Theodoro Grist, MD ED   09/10/2016 05:07 09/15/2016 18:26 Full Code [id number]  Harrie Foreman, MD Inpatient       Prognosis: < 2 weeks  Discharge Planning:  To Be Determined  Care plan was discussed with Dr. Margaretmary Eddy  Thank you for allowing the Palliative Medicine Team to assist in the care of this patient.   Total Time 25 min Prolonged Time Billed  no      Greater than 50%  of this time was spent counseling and coordinating  care related to the above assessment and plan.  Asencion Gowda, NP  Please contact Palliative Medicine Team phone at [phone] for questions and concerns.

## 2017-09-11 NOTE — Progress Notes (Signed)
[place] [facility] [place] NAME: Tammie Mcmahon    MR#:  [id number]  DATE OF BIRTH:  December 14, 1927  SUBJECTIVE:  CHIEF COMPLAINT:  Patient is delirious , intermittently agitated Eating some food today.  Discussed with healthcare power of attorney nephew and niece, requesting to continue IV fluids for 1 more day to see if patient clinically improves.   REVIEW OF SYSTEMS:  Review of systems unobtainable  DRUG ALLERGIES:  No Known Allergies  VITALS:  Blood pressure (!) 139/56, pulse 80, temperature 98 F (36.7 C), temperature source Axillary, resp. rate 16, height 5\' 2"  (1.575 m), weight 50.8 kg (111 lb 14.4 oz), SpO2 94 %.  PHYSICAL EXAMINATION:  GENERAL:  83 y.o.-year-old patient lying in the bed with no acute distress.  EYES: Pupils equal, round, reactive to light and accommodation. No scleral icterus. Extraocular muscles intact.  HEENT: Head atraumatic, normocephalic. Oropharynx and nasopharynx clear.  NECK:  Supple, no jugular venous distention. No thyroid enlargement, no tenderness.  LUNGS: Normal breath sounds bilaterally, no wheezing, rales,rhonchi or crepitation. No of accessory muscles of respiration.  CARDIOVASCULAR: S1, S2 normal. No murmurs, rubs, or gallops.  ABDOMEN: Soft, nontender, nondistended. Bowel sounds present.  EXTREMITIES: No pedal edema, cyanosis, or clubbing.  NEUROLOGIC: delerious PSYCHIATRIC: The patient is delerious  SKIN: No obvious rash, lesion, or ulcer.    LABORATORY PANEL:   CBC Recent Labs  Lab 09/11/17 0518  WBC 11.0  HGB 10.8*  HCT 32.7*  PLT 311   ------------------------------------------------------------------------------------------------------------------  Chemistries  Recent Labs  Lab 09/11/17 0518  NA 146*  K 4.1  CL 119*  CO2 18*  GLUCOSE 102*  BUN 32*  CREATININE 1.64*  CALCIUM 8.1*    ------------------------------------------------------------------------------------------------------------------  Cardiac Enzymes Recent Labs  Lab 09/09/17 1303  TROPONINI 0.06*   ------------------------------------------------------------------------------------------------------------------  RADIOLOGY:  No results found.  EKG:   Orders placed or performed during the hospital encounter of 09/09/17  . ED EKG  . ED EKG  . EKG 12-Lead  . EKG 12-Lead  . EKG 12-Lead  . EKG 12-Lead    ASSESSMENT AND PLAN:   83 year old female patient with multiple medical problems of hypertension, hyperlipidemia, dementia came in from nursing home because of multiple falls, left hip pain.  1.  Left hip pain likely musculoskeletal patient has no evidence of fracture on x-ray. Pain management as tolerated  2.  Recurrent falls secondary to dementia.  Patient has no evidence of intracranial abnormality, CT spine also use unremarkable.  Get physical therapy evaluation.  3.  Acute renal injury likely due to dehydration, patient not eating and drinking well for the past few weeks.   Continue gentle hydration for another 24 hours as per healthcare power of attorney niece and nephews request  avoid ACE inhibitors and other nephrotoxins  4.  Acute fractures on the right fifth ribs: Secondary to fall, patient complains of mild abdominal pain likely coming from the fractures.   Continue incentive spirometry, pain medicines.  5 dementia, depression: Continue Cymbalta.  6.  History of chronic atrial fibrillation: Rate controlled, continue amiodarone  7.  Due to multiple medical problems, dementia patient physician Dr. Ouida Sills requested hospice to follow at nursing home Palliative care is following.  Appreciate palliative care recommendations  8 UTI: Patient recent urine cultures showed Pseudomonas UTI more than 100,000 colonies from November 30 culture results.  Patient was Keflex but during this  admission patient is treated with ciprofloxacin but urine cultures are negative for  any growth.  Discontinue ciprofloxacin   d/c keflex.   Disposition if no clinical improvement in the next 24 hours family is considering hospice home rather than hospice care at facility    All the records are reviewed and case discussed with Care Management/Social Workerr. Management plans discussed with the patient's niece and nephew who is the healthcare power of attorney, they are in agreement.  CODE STATUS: dnr  TOTAL TIME TAKING CARE OF THIS PATIENT: 36  minutes.   POSSIBLE D/C IN 1-2  DAYS, DEPENDING ON CLINICAL CONDITION.  Note: This dictation was prepared with Dragon dictation along with smaller phrase technology. Any transcriptional errors that result from this process are unintentional.   Nicholes Mango M.D on 09/11/2017 at 6:48 PM  Between 7am to 6pm - Pager - [phone] After 6pm go to [email or web address] - password [email or web address] [facility]  [facility]  Office  [phone]  CC: Primary care physician; Kirk Ruths, MD

## 2017-09-11 NOTE — Progress Notes (Signed)
Hospital bed to be delivered to [facility] Today.

## 2017-09-12 DIAGNOSIS — M25551 Pain in right hip: Secondary | ICD-10-CM | POA: Diagnosis not present

## 2017-09-12 DIAGNOSIS — N179 Acute kidney failure, unspecified: Secondary | ICD-10-CM | POA: Diagnosis not present

## 2017-09-12 LAB — BASIC METABOLIC PANEL
ANION GAP: 3 — AB (ref 5–15)
BUN: 31 mg/dL — AB (ref 6–20)
CO2: 20 mmol/L — AB (ref 22–32)
Calcium: 7.4 mg/dL — ABNORMAL LOW (ref 8.9–10.3)
Chloride: 128 mmol/L — ABNORMAL HIGH (ref 101–111)
Creatinine, Ser: 1.29 mg/dL — ABNORMAL HIGH (ref 0.44–1.00)
GFR calc Af Amer: 42 mL/min — ABNORMAL LOW (ref >60)
GFR, EST NON AFRICAN AMERICAN: 36 mL/min — AB (ref >60)
GLUCOSE: 98 mg/dL (ref 65–99)
POTASSIUM: 3.6 mmol/L (ref 3.5–5.1)
Sodium: 151 mmol/L — ABNORMAL HIGH (ref 135–145)

## 2017-09-12 MED ORDER — PANTOPRAZOLE SODIUM 40 MG PO PACK: 40.0000 mg | PACK | Freq: Every day | ORAL | Status: DC

## 2017-09-12 MED ORDER — TRAZODONE HCL 50 MG PO TABS: 25.0000 mg | ORAL_TABLET | Freq: Every evening | ORAL | Status: DC | PRN

## 2017-09-12 MED ORDER — ONDANSETRON HCL 4 MG PO TABS: 4.0000 mg | ORAL_TABLET | Freq: Four times a day (QID) | ORAL | 0 refills | Status: DC | PRN

## 2017-09-12 MED ORDER — RISPERIDONE 1 MG PO TABS: 1.0000 mg | ORAL_TABLET | Freq: Two times a day (BID) | ORAL | 0 refills | Status: AC

## 2017-09-12 MED ORDER — HALOPERIDOL 1 MG PO TABS: 1.0000 mg | ORAL_TABLET | Freq: Four times a day (QID) | ORAL | 0 refills | Status: DC | PRN

## 2017-09-12 MED ORDER — HYDROCODONE-ACETAMINOPHEN 5-325 MG PO TABS: 1.0000 | ORAL_TABLET | Freq: Four times a day (QID) | ORAL | 0 refills | Status: DC | PRN

## 2017-09-12 NOTE — Progress Notes (Signed)
Pt is staying until Monday, family was in visiting.  Appetite okay. No complaints of pain. Pt is needing sitter at [employer].

## 2017-09-12 NOTE — Care Management Note (Signed)
Case Management Note  Patient Details  Name: Shanta Hartner Strom MRN: [id number] Date of Birth: 11-15-1927  Subjective/Objective:     Referral faxed to Lorelle Formosa at [facility] of A/C for hospice services for Mrs Gwinner after she returns to [facility] today. Hospital bed has been delivered.                Action/Plan:   Expected Discharge Date:  09/12/17               Expected Discharge Plan:  Home w Hospice Care  In-House Referral:     Discharge planning Services  CM Consult  Post Acute Care Choice:  Hospice Choice offered to:  Patient  DME Arranged:    DME Agency:     HH Arranged:    [place] Agency:  [facility]  Status of Service:  Completed, signed off  If discussed at [place] of Stay Meetings, dates discussed:    Additional Comments:  Dayjah Selman A, RN 09/12/2017, 12:47 PM

## 2017-09-12 NOTE — Discharge Instructions (Signed)
1. Left hip pain likely musculoskeletal patient has no evidence of fracture on x-ray. Pain management as tolerated  2. Recurrent falls secondary to dementia. Patient has no evidence of intracranial abnormality, CT spine also use unremarkable. Get physical therapy evaluation.  3. Acute renal injury likely due to dehydration, patient not eating and drinking well for the past few weeks.  Continue gentle hydration for another 24 hours as per healthcare power of attorney niece and nephews request  avoid ACE inhibitors and other nephrotoxins  4. Acute fractures on the right fifth ribs: Secondary to fall, patient complains of mild abdominal pain likely coming from the fractures.  Continue incentive spirometry, pain medicines.  5 dementia, depression: Continue Cymbalta.  6. History of chronic atrial fibrillation: Rate controlled, continue amiodarone  7. Due to multiple medical problems, dementia patient physician Dr. Ouida Sills requested hospice to follow at nursing home Palliative care is following.  Appreciate palliative care recommendations  8 UTI: Patient recent urine cultures showed Pseudomonas UTI more than 100,000 colonies from November 30 culture results. Patient was Keflex but during this admission patient is treated with ciprofloxacin but urine cultures are negative for any growth.  Discontinue ciprofloxacin   d/c keflex.

## 2017-09-12 NOTE — NC FL2 (Signed)
[facility] LEVEL OF CARE SCREENING TOOL     IDENTIFICATION  Patient Name: Tammie Mcmahon Birthdate: 01/02/1928 Sex: female Admission Date (Current Location): 09/09/2017  [place] and [place] Number:  Engineering geologist and Address:  [facility], [address], [place], [place] [address]      Provider Number: [id number]  Attending Physician Name and Address:  Nicholes Mango, MD  Relative Name and Phone Number:       Current Level of Care: Hospital Recommended Level of Care: [place], Other (Comment) MCU Prior Approval Number:    Date Approved/Denied:   PASRR Number:    Discharge Plan: Domiciliary (Rest home)    Current Diagnoses: Patient Active Problem List   Diagnosis Date Noted  . Acute kidney injury ([place]) 09/09/2017  . Fall 02/13/2017  . Syncope 01/04/2017  . Lower abdominal pain 01/04/2017  . CKD (chronic kidney disease), stage III ([place]) 01/04/2017  . Elevated troponin 01/04/2017  . Hypokalemia 01/04/2017  . Leukocytosis 01/04/2017  . Dysuria 01/04/2017  . Constipation 01/04/2017  . Symptomatic bradycardia 10/22/2016  . Atrial fibrillation ([place]) 10/22/2016  . Heart failure with preserved ejection fraction ([place]) 10/22/2016  . Hypertension 10/22/2016  . Hyperlipidemia 10/22/2016  . Acute on chronic renal failure ([place]) 10/22/2016  . Other specified cardiac arrhythmias (CODE) 10/16/2016  . Confusion 09/10/2016    Orientation RESPIRATION BLADDER Height & Weight     Self  O2(2L) Indwelling catheter Weight: 111 lb 6.4 oz (50.5 kg) Height:  5\' 2"  (157.5 cm)  BEHAVIORAL SYMPTOMS/MOOD NEUROLOGICAL BOWEL NUTRITION STATUS      Incontinent Diet(Dysphagia 3, Thin Liquids)  AMBULATORY STATUS COMMUNICATION OF NEEDS Skin   Total Care Verbally Normal                       Personal Care Assistance Level of Assistance  Feeding, Bathing, Dressing Bathing Assistance: Limited assistance Feeding  assistance: Limited assistance Dressing Assistance: Limited assistance     Functional Limitations Info             SPECIAL CARE FACTORS FREQUENCY  ([facility])                    Contractures Contractures Info: Not present    Additional Factors Info  Code Status, Allergies, Psychotropic Code Status Info: DNR Allergies Info: No known Allergies Psychotropic Info: Haldol PRN         Current Medications (09/12/2017):  This is the current hospital active medication list Current Facility-Administered Medications  Medication Dose Route Frequency Provider Last Rate Last Dose  . 0.9 %  sodium chloride infusion   Intravenous Continuous Nicholes Mango, MD 125 mL/hr at 09/12/17 0836    . acetaminophen (TYLENOL) tablet 650 mg  650 mg Oral Q6H PRN Epifanio Lesches, MD       Or  . acetaminophen (TYLENOL) suppository 650 mg  650 mg Rectal Q6H PRN Epifanio Lesches, MD      . amiodarone (PACERONE) tablet 200 mg  200 mg Oral Daily Epifanio Lesches, MD   200 mg at 09/12/17 1027  . aspirin chewable tablet 81 mg  81 mg Oral Daily Epifanio Lesches, MD   81 mg at 09/12/17 1027  . azelastine (ASTELIN) 0.1 % nasal spray 1 spray  1 spray Each Nare Daily PRN Epifanio Lesches, MD      . bisacodyl (DULCOLAX) EC tablet 5 mg  5 mg Oral Daily PRN Vianne Bulls,  [place]ne[place], MD      . cholecalciferol (VITAMIN D) tablet 1,000 Units  1,000 Units Oral Daily Epifanio Lesches, MD   1,000 Units at 09/12/17 1027  . docusate sodium (COLACE) capsule 100 mg  100 mg Oral BID Epifanio Lesches, MD   100 mg at 09/12/17 1027  . DULoxetine (CYMBALTA) DR capsule 60 mg  60 mg Oral Daily Epifanio Lesches, MD   60 mg at 09/12/17 1027  . feeding supplement (ENSURE ENLIVE) (ENSURE ENLIVE) liquid 237 mL  237 mL Oral TID BM Yaiden Yang, MD   237 mL at 09/12/17 1027  . haloperidol (HALDOL) tablet 1 mg  1 mg Oral Q6H PRN Brelan Hannen, MD       Or  . haloperidol lactate  (HALDOL) injection 1 mg  1 mg Intramuscular Q6H PRN Mihir Flanigan, MD   1 mg at 09/10/17 1042  . haloperidol lactate (HALDOL) injection 1 mg  1 mg Intravenous Q6H PRN Loletha Grayer, MD   1 mg at 09/12/17 1211  . heparin injection 5,000 Units  5,000 Units Subcutaneous Q8H Epifanio Lesches, MD   5,000 Units at 09/12/17 0641  . levothyroxine (SYNTHROID, LEVOTHROID) tablet 88 mcg  88 mcg Oral QAC breakfast Epifanio Lesches, MD   88 mcg at 09/12/17 1026  . magnesium hydroxide (MILK OF MAGNESIA) suspension 30 mL  30 mL Oral Q48H PRN Epifanio Lesches, MD      . morphine 2 MG/ML injection 2 mg  2 mg Intravenous Q4H PRN Loletha Grayer, MD   2 mg at 09/11/17 1840  . ondansetron (ZOFRAN) tablet 4 mg  4 mg Oral Q6H PRN Epifanio Lesches, MD       Or  . ondansetron (ZOFRAN) injection 4 mg  4 mg Intravenous Q6H PRN Epifanio Lesches, MD      . oxyCODONE-acetaminophen (PERCOCET/ROXICET) 5-325 MG per tablet 1 tablet  1 tablet Oral Q6H PRN Epifanio Lesches, MD   1 tablet at 09/11/17 2102  . pantoprazole sodium (PROTONIX) 40 mg/20 mL oral suspension 40 mg  40 mg Oral Daily Makana Feigel, MD   40 mg at 09/11/17 0942  . pneumococcal 23 valent vaccine (PNU-IMMUNE) injection 0.5 mL  0.5 mL Intramuscular Tomorrow-1000 Epifanio Lesches, MD      . risperiDONE (RISPERDAL) tablet 1 mg  1 mg Oral BID Asencion Gowda, NP   1 mg at 09/11/17 2101  . traZODone (DESYREL) tablet 25 mg  25 mg Oral QHS PRN Epifanio Lesches, MD   25 mg at 09/11/17 2209     Discharge Medications: Medication List     STOP taking these medications   cephALEXin 500 MG capsule Commonly known as:  KEFLEX   cholecalciferol 1000 units tablet Commonly known as:  VITAMIN D   ciprofloxacin 500 MG tablet Commonly known as:  CIPRO   diclofenac sodium 1 % Gel Commonly known as:  VOLTAREN   omeprazole 20 MG capsule Commonly known as:  PRILOSEC   potassium chloride 10 MEQ CR capsule Commonly known as:   MICRO-K   pravastatin 40 MG tablet Commonly known as:  PRAVACHOL     TAKE these medications   amiodarone 200 MG tablet Commonly known as:  PACERONE Take 200 mg by mouth daily. (0800)   aspirin 81 MG chewable tablet Chew 81 mg by mouth daily. (0800)   azelastine 0.1 % nasal spray Commonly known as:  ASTELIN Place 1 spray into both nostrils daily as needed for allergies.   DULoxetine 60 MG capsule Commonly known as:  CYMBALTA  Take 60 mg by mouth daily. (0800)   haloperidol 1 MG tablet Commonly known as:  HALDOL Take 1 tablet (1 mg total) by mouth every 6 (six) hours as needed for agitation.   HYDROcodone-acetaminophen 5-325 MG tablet Commonly known as:  NORCO Take 1 tablet by mouth every 6 (six) hours as needed for moderate pain.   levothyroxine 88 MCG tablet Commonly known as:  SYNTHROID, LEVOTHROID Take 88 mcg by mouth daily before breakfast. 30-60 minutes before breakfast   lidocaine 5 % Commonly known as:  LIDODERM Place 1 patch onto the skin daily. Remove & Discard patch within 12 hours or as directed by MD ON at 0800, & OFF at 2000   lisinopril 5 MG tablet Commonly known as:  PRINIVIL,ZESTRIL Take 1 tablet (5 mg total) by mouth daily.   magnesium hydroxide 400 MG/5ML suspension Commonly known as:  MILK OF MAGNESIA Take 30 mLs by mouth every other day as needed (for constipation).   NUTRITIONAL SHAKE PO Take 1 Bottle by mouth 3 (three) times daily.   ondansetron 4 MG tablet Commonly known as:  ZOFRAN Take 1 tablet (4 mg total) by mouth every 6 (six) hours as needed for nausea.   pantoprazole sodium 40 mg/20 mL Pack Commonly known as:  PROTONIX Take 20 mLs (40 mg total) by mouth daily.   polyethylene glycol packet Commonly known as:  MIRALAX / GLYCOLAX Take 17 g by mouth daily as needed for moderate constipation. What changed:  when to take this   risperiDONE 1 MG tablet Commonly known as:  RISPERDAL Take 1 tablet (1 mg total) by  mouth 2 (two) times daily.   senna 8.6 MG Tabs tablet Commonly known as:  SENOKOT Take 1 tablet (8.6 mg total) by mouth 2 (two) times daily.   traZODone 50 MG tablet Commonly known as:  DESYREL Take 0.5 tablets (25 mg total) by mouth at bedtime as needed for sleep.   TYLENOL 8 HOUR ARTHRITIS PAIN 650 MG CR tablet Generic drug:  acetaminophen Take 650-1,300 mg by mouth 2 (two) times daily. (0800 & 2000)        Relevant Imaging Results:  Relevant Lab Results:   Additional Information    Zettie Pho, LCSW

## 2017-09-12 NOTE — Care Management Note (Signed)
Case Management Note  Patient Details  Name: Tammie Mcmahon MRN: [id number] Date of Birth: October 29, 1927  Subjective/Objective:    Change of discharge plan. No discharge over the weekend because [place] provide a sitter. Wanda at [facility] of A/C was notified via e-mail.                Action/Plan:   Expected Discharge Date:  09/12/17               Expected Discharge Plan:  Home w Hospice Care  In-House Referral:     Discharge planning Services  CM Consult  Post Acute Care Choice:  Hospice Choice offered to:  Patient  DME Arranged:    DME Agency:     HH Arranged:    [place] Agency:  [facility]  Status of Service:  Completed, signed off  If discussed at [place] of Stay Meetings, dates discussed:    Additional Comments:  Welda Azzarello A, RN 09/12/2017, 1:18 PM

## 2017-09-12 NOTE — Clinical Social Work Note (Addendum)
CSW has placed call to the patient's facility to confirm that the patient can return today. CSW is waiting for call back from supervisor. CSW is following.  CSW received call back from [employer] for [facility] who stated that no management was available to assess the patient for return. The CSW has alerted the Director of [place] department who will discuss with the Development worker, international aid for [facility], [place].  Per Benita Gutter, the patient will return today via EMS.  The patient's nephew is aware and in agreement. The CSW will deliver the discharge packet once the summary and FL 2 are available. Nurse will not need to give report.   Santiago Bumpers, MSW, Latanya Presser [phone]

## 2017-09-12 NOTE — Discharge Summary (Signed)
[facility] NAME: Tammie Mcmahon    MR#:  [id number]  DATE OF BIRTH:  December 28, 1927  DATE OF ADMISSION:  09/09/2017 ADMITTING PHYSICIAN: Epifanio Lesches, MD  DATE OF DISCHARGE: 09/12/17  PRIMARY CARE PHYSICIAN: Kirk Ruths, MD    ADMISSION DIAGNOSIS:  Fall [W19.XXXA] Bilateral hip pain [M25.551, M25.552] Acute cystitis with hematuria [N30.01] AKI (acute kidney injury) ([place]) [N17.9] Abrasion of right shoulder, initial encounter [S40.211A] Fall, initial encounter [W19.XXXA]  DISCHARGE DIAGNOSIS:  Active Problems:   Acute kidney injury ([place]) ftt Continue hospice care at facility Continue 3 L of oxygen via nasal cannula   SECONDARY DIAGNOSIS:   Past Medical History:  Diagnosis Date  . (HFpEF) heart failure with preserved ejection fraction ([place])   . Atrial fibrillation and flutter ([place])   . Breast cancer ([place])   . CKD (chronic kidney disease), stage III ([place])   . Hyperlipidemia   . Hypertension   . Low back pain   . Menopausal and postmenopausal disorder   . PA (pernicious anemia)   . Thyroid disease   . Valvular heart disease     HOSPITAL COURSE:   HPI  Tammie Mcmahon  is a 83 y.o. female with a known history of essential hypertension, dementia, recurrent UTIs, recently completed Keflex films from [place] also because of the fall.  Patient fell last night and also this morning.  Patient complained of left hip pain this morning so she came to ER by EMS.  Left hip x-rays are negative for fractures.  Patient complains of abdominal pain.  According to her niece patient had birthday party yesterday and she had few bites of cake  small amount of pizza..  Not eating well for the past few weeks associated with.  Patient has sitter at bedside in the emergency room because she is trying to get out of the bed and   1. Left hip pain likely musculoskeletal patient has no evidence of fracture on x-ray. Pain management  as tolerated  2. Recurrent falls secondary to dementia. Patient has no evidence of intracranial abnormality, CT spine also use unremarkable. Get physical therapy evaluation.  3. Acute renal injury likely due to dehydration, patient not eating and drinking well for the past few weeks.  Clinically improved with IV fluids. Patient started taking by mouth. Encourage by mouth intake  avoid ACE inhibitors and other nephrotoxins  4. Acute fractures on the right fifth ribs: Secondary to fall, patient complains of mild abdominal pain likely coming from the fractures.  Continue incentive spirometry, pain medicines.  5 dementia, depression: Continue Cymbalta.  6. History of chronic atrial fibrillation: Rate controlled, continue amiodarone  7. Due to multiple medical problems, dementia patient physician Dr. Ouida Sills requested hospice to follow at nursing home Palliative care is following.  Appreciate palliative care recommendations  8 UTI: Patient recent urine cultures showed Pseudomonas UTI more than 100,000 colonies from November 30 culture results. Patient was Keflex but during this admission patient is treated with ciprofloxacin but urine cultures are negative for any growth  during this current admission  Discontinue ciprofloxacin   d/c keflex.      DISCHARGE CONDITIONS:   fair  CONSULTS OBTAINED:     PROCEDURES none   DRUG ALLERGIES:  No Known Allergies  DISCHARGE MEDICATIONS:   Allergies as of 09/12/2017   No Known Allergies     Medication List    STOP taking these medications   cephALEXin 500 MG capsule Commonly  known as:  KEFLEX   cholecalciferol 1000 units tablet Commonly known as:  VITAMIN D   ciprofloxacin 500 MG tablet Commonly known as:  CIPRO   diclofenac sodium 1 % Gel Commonly known as:  VOLTAREN   omeprazole 20 MG capsule Commonly known as:  PRILOSEC   potassium chloride 10 MEQ CR capsule Commonly known as:  MICRO-K    pravastatin 40 MG tablet Commonly known as:  PRAVACHOL     TAKE these medications   amiodarone 200 MG tablet Commonly known as:  PACERONE Take 200 mg by mouth daily. (0800)   aspirin 81 MG chewable tablet Chew 81 mg by mouth daily. (0800)   azelastine 0.1 % nasal spray Commonly known as:  ASTELIN Place 1 spray into both nostrils daily as needed for allergies.   DULoxetine 60 MG capsule Commonly known as:  CYMBALTA Take 60 mg by mouth daily. (0800)   haloperidol 1 MG tablet Commonly known as:  HALDOL Take 1 tablet (1 mg total) by mouth every 6 (six) hours as needed for agitation.   HYDROcodone-acetaminophen 5-325 MG tablet Commonly known as:  NORCO Take 1 tablet by mouth every 6 (six) hours as needed for moderate pain.   levothyroxine 88 MCG tablet Commonly known as:  SYNTHROID, LEVOTHROID Take 88 mcg by mouth daily before breakfast. 30-60 minutes before breakfast   lidocaine 5 % Commonly known as:  LIDODERM Place 1 patch onto the skin daily. Remove & Discard patch within 12 hours or as directed by MD ON at 0800, & OFF at 2000   lisinopril 5 MG tablet Commonly known as:  PRINIVIL,ZESTRIL Take 1 tablet (5 mg total) by mouth daily.   magnesium hydroxide 400 MG/5ML suspension Commonly known as:  MILK OF MAGNESIA Take 30 mLs by mouth every other day as needed (for constipation).   NUTRITIONAL SHAKE PO Take 1 Bottle by mouth 3 (three) times daily.   ondansetron 4 MG tablet Commonly known as:  ZOFRAN Take 1 tablet (4 mg total) by mouth every 6 (six) hours as needed for nausea.   pantoprazole sodium 40 mg/20 mL Pack Commonly known as:  PROTONIX Take 20 mLs (40 mg total) by mouth daily.   polyethylene glycol packet Commonly known as:  MIRALAX / GLYCOLAX Take 17 g by mouth daily as needed for moderate constipation. What changed:  when to take this   risperiDONE 1 MG tablet Commonly known as:  RISPERDAL Take 1 tablet (1 mg total) by mouth 2 (two) times daily.    senna 8.6 MG Tabs tablet Commonly known as:  SENOKOT Take 1 tablet (8.6 mg total) by mouth 2 (two) times daily.   traZODone 50 MG tablet Commonly known as:  DESYREL Take 0.5 tablets (25 mg total) by mouth at bedtime as needed for sleep.   TYLENOL 8 HOUR ARTHRITIS PAIN 650 MG CR tablet Generic drug:  acetaminophen Take 650-1,300 mg by mouth 2 (two) times daily. (0800 & 2000)        DISCHARGE INSTRUCTIONS:   Continue hospice care at the facility Follow-up by primary care physician at the facility in 2-3 days Continue 3 L of oxygen via nasal cannula Provide feeding assistance Sitter for safety  DIET:  Dysphagia 3 diet  DISCHARGE CONDITION:  [place]  ACTIVITY:  Activity as tolerated  OXYGEN:  Home Oxygen: Yes.     Oxygen Delivery: 3 liters/min via Patient connected to nasal cannula oxygen  DISCHARGE LOCATION:   healthcare assisted living facility to continue hospice care at  the facility   If you experience worsening of your admission symptoms, develop shortness of breath, life threatening emergency, suicidal or homicidal thoughts you must seek medical attention immediately by calling 911 or calling your MD immediately  if symptoms less severe.  You Must read complete instructions/literature along with all the possible adverse reactions/side effects for all the Medicines you take and that have been prescribed to you. Take any new Medicines after you have completely understood and accpet all the possible adverse reactions/side effects.   Please note  You were cared for by a hospitalist during your hospital stay. If you have any questions about your discharge medications or the care you received while you were in the hospital after you are discharged, you can call the unit and asked to speak with the hospitalist on call if the hospitalist that took care of you is not available. Once you are discharged, your primary care physician will handle any further medical issues.  Please note that NO REFILLS for any discharge medications will be authorized once you are discharged, as it is imperative that you return to your primary care physician (or establish a relationship with a primary care physician if you do not have one) for your aftercare needs so that they can reassess your need for medications and monitor your lab values.     Today  Chief Complaint  Patient presents with  . Fall   Patient is demented, but more alert today and tolerating diet. Patient niece and nephew are requesting to transfer the patient to [facility] with hospice care  ROS: Unobtainable VITAL SIGNS:  Blood pressure 124/81, pulse 79, temperature 98.3 F (36.8 C), temperature source Oral, resp. rate 16, height 5\' 2"  (1.575 m), weight 50.5 kg (111 lb 6.4 oz), SpO2 91 %.  I/O:    Intake/Output Summary (Last 24 hours) at 09/12/2017 1245 Last data filed at 09/12/2017 1024 Gross per 24 hour  Intake 3908 ml  Output -  Net 3908 ml    PHYSICAL EXAMINATION:  GENERAL:  83 y.o.-year-old patient lying in the bed with no acute distress.  EYES: Pupils equal, round, reactive to light and accommodation. No scleral icterus.   HEENT: Head atraumatic, normocephalic. Oropharynx and nasopharynx clear.  NECK:  Supple, no jugular venous distention. No thyroid enlargement, no tenderness.  LUNGS: Normal breath sounds bilaterally, no wheezing, rales,rhonchi or crepitation. No use of accessory muscles of respiration.  CARDIOVASCULAR: S1, S2 normal. No murmurs, rubs, or gallops.  ABDOMEN: Soft, non-tender, non-distended. Bowel sounds present.  EXTREMITIES: No pedal edema, cyanosis, or clubbing.  NEUROLOGIC: Patient is awake, disoriented  PSYCHIATRIC: The patient is disoriented SKIN: No obvious rash, lesion, or ulcer.   DATA REVIEW:   CBC Recent Labs  Lab 09/11/17 0518  WBC 11.0  HGB 10.8*  HCT 32.7*  PLT 311    Chemistries  Recent Labs  Lab 09/12/17 0447  NA 151*  K 3.6  CL 128*   CO2 20*  GLUCOSE 98  BUN 31*  CREATININE 1.29*  CALCIUM 7.4*    Cardiac Enzymes Recent Labs  Lab 09/09/17 1303  TROPONINI 0.06*    Microbiology Results  Results for orders placed or performed during the hospital encounter of 09/09/17  MRSA PCR Screening     Status: None   Collection Time: 09/09/17  4:43 PM  Result Value Ref Range Status   MRSA by PCR NEGATIVE NEGATIVE Final    Comment:        The GeneXpert MRSA Assay (FDA  approved for NASAL specimens only), is one component of a comprehensive MRSA colonization surveillance program. It is not intended to diagnose MRSA infection nor to guide or monitor treatment for MRSA infections.   Urine Culture     Status: None   Collection Time: 09/10/17 11:40 AM  Result Value Ref Range Status   Specimen Description URINE, CATHETERIZED  Final   Special Requests Normal  Final   Culture   Final    NO GROWTH Performed at [facility], [address] [place], [place] [address]    Report Status 09/11/2017 FINAL  Final    RADIOLOGY:  Dg Thoracic Spine 2 View  Result Date: 09/09/2017 CLINICAL DATA:  Patient sustained a fall. Patient had difficulty cooperating with positioning. EXAM: THORACIC SPINE 2 VIEWS COMPARISON:  Thoracic spine series dated June 23, 2017 FINDINGS: The patient has undergone interval kyphoplasty of a T11 fracture. The other thoracic vertebral bodies are reasonably well maintained in height. There is prominent kyphosis and there is moderate curvature convex toward the left centered at T11. There is dense calcification in the wall of the aortic arch. IMPRESSION: Stable degenerative and post traumatic changes of the thoracic and upper lumbar spine. Interval kyphoplasty of T11. Thoracic aortic atherosclerosis. Electronically Signed   By: David  Martinique M.D.   On: 09/09/2017 13:01   Dg Shoulder Right  Result Date: 09/09/2017 CLINICAL DATA:  83 year old female presenting with pain after fall EXAM: RIGHT  SHOULDER - 2+ VIEW COMPARISON:  01/05/2017 CXR FINDINGS: There is a displaced right lateral fifth rib fracture without significant callus formation suggesting a recent fracture. No pneumothorax or pulmonary contusion. The [facility] and glenohumeral joints are intact with mild degenerative joint space narrowing of the glenohumeral joint superiorly. No fracture noted about the glenohumeral nor AC joints. IMPRESSION: 1. Recent/acute appearing right lateral fifth rib fracture with displacement. No associated findings of pneumothorax or pulmonary contusion. No pleural effusion. 2. Osteoarthritis of the glenohumeral joint. Electronically Signed   By: Ashley Royalty M.D.   On: 09/09/2017 13:14   Dg Ankle 2 Views Right  Result Date: 09/09/2017 CLINICAL DATA:  Fall.  Right ankle pain. EXAM: RIGHT ANKLE - 2 VIEW COMPARISON:  None. FINDINGS: The ankle is located. No acute bone or soft tissue abnormalities are present. Mild degenerative changes are noted. IMPRESSION: Mild degenerative changes without acute fracture. Electronically Signed   By: San Morelle M.D.   On: 09/09/2017 13:04   Ct Head Wo Contrast  Result Date: 09/09/2017 CLINICAL DATA:  Unwitnessed fall last night. EXAM: CT HEAD WITHOUT CONTRAST CT CERVICAL SPINE WITHOUT CONTRAST TECHNIQUE: Multidetector CT imaging of the head and cervical spine was performed following the standard protocol without intravenous contrast. Multiplanar CT image reconstructions of the cervical spine were also generated. COMPARISON:  07/26/2017 FINDINGS: CT HEAD FINDINGS Brain: There is atrophy and chronic small vessel disease changes. Physiologic calcifications in the basal ganglia, stable. No acute intracranial abnormality. Specifically, no hemorrhage, hydrocephalus, mass lesion, acute infarction, or significant intracranial injury. Vascular: No hyperdense vessel or unexpected calcification. Skull: No acute calvarial abnormality. Sinuses/Orbits: Visualized paranasal sinuses and  mastoids clear. Orbital soft tissues unremarkable. Other: None CT CERVICAL SPINE FINDINGS Alignment: Normal Skull base and vertebrae: No fractures Soft tissues and spinal canal: Prevertebral soft tissues are normal. No epidural or paraspinal hematoma. Disc levels: Diffuse degenerative disc disease with disc space narrowing. Mild posterior spurring. Mild diffuse degenerative facet disease. Upper chest: No acute findings. Other: None IMPRESSION: No acute intracranial abnormality.Atrophy, chronic microvascular  disease. Degenerative changes in the cervical spine. No acute bony abnormality. Electronically Signed   By: Rolm Baptise M.D.   On: 09/09/2017 12:43   Ct Cervical Spine Wo Contrast  Result Date: 09/09/2017 CLINICAL DATA:  Unwitnessed fall last night. EXAM: CT HEAD WITHOUT CONTRAST CT CERVICAL SPINE WITHOUT CONTRAST TECHNIQUE: Multidetector CT imaging of the head and cervical spine was performed following the standard protocol without intravenous contrast. Multiplanar CT image reconstructions of the cervical spine were also generated. COMPARISON:  07/26/2017 FINDINGS: CT HEAD FINDINGS Brain: There is atrophy and chronic small vessel disease changes. Physiologic calcifications in the basal ganglia, stable. No acute intracranial abnormality. Specifically, no hemorrhage, hydrocephalus, mass lesion, acute infarction, or significant intracranial injury. Vascular: No hyperdense vessel or unexpected calcification. Skull: No acute calvarial abnormality. Sinuses/Orbits: Visualized paranasal sinuses and mastoids clear. Orbital soft tissues unremarkable. Other: None CT CERVICAL SPINE FINDINGS Alignment: Normal Skull base and vertebrae: No fractures Soft tissues and spinal canal: Prevertebral soft tissues are normal. No epidural or paraspinal hematoma. Disc levels: Diffuse degenerative disc disease with disc space narrowing. Mild posterior spurring. Mild diffuse degenerative facet disease. Upper chest: No acute findings.  Other: None IMPRESSION: No acute intracranial abnormality.Atrophy, chronic microvascular disease. Degenerative changes in the cervical spine. No acute bony abnormality. Electronically Signed   By: Rolm Baptise M.D.   On: 09/09/2017 12:43   Dg Hip Unilat With Pelvis 2-3 Views Left  Result Date: 09/09/2017 CLINICAL DATA:  83 year old female presenting with left hip pain after fall. EXAM: DG HIP (WITH OR WITHOUT PELVIS) 2-3V LEFT COMPARISON:  01/04/2017 FINDINGS: There is no evidence of hip fracture or dislocation. Stable mild degenerative joint space narrowing of the left hip. The included bony pelvis appears intact. IMPRESSION: Stable mild joint space narrowing of the left hip, degenerative in etiology. No acute fracture or malalignment. Electronically Signed   By: Ashley Royalty M.D.   On: 09/09/2017 13:08   Dg Hip Unilat With Pelvis 2-3 Views Right  Result Date: 09/09/2017 CLINICAL DATA:  83 year old female presenting with right hip pain after fall. EXAM: DG HIP (WITH OR WITHOUT PELVIS) 2-3V RIGHT COMPARISON:  05/20/2017 radiographs FINDINGS: There is no evidence of hip fracture or dislocation. Lower lumbar degenerative disc and facet arthropathy of the included lumbar spine from L4 through S1. The arcuate lines of the sacrum appear grossly intact. No acute pelvic fracture. No suspicious osseous lesions. Degenerative joint space narrowing is identified of both hips. Bilateral iliofemoral arteriosclerosis. IMPRESSION: Mild degenerative joint space narrowing of both hips. No acute fracture is identified. Electronically Signed   By: Ashley Royalty M.D.   On: 09/09/2017 13:04    EKG:   Orders placed or performed during the hospital encounter of 09/09/17  . ED EKG  . ED EKG  . EKG 12-Lead  . EKG 12-Lead  . EKG 12-Lead  . EKG 12-Lead      Management plans discussed with the patient, family and they are in agreement.  CODE STATUS:     Code Status Orders  (From admission, onward)        Start      Ordered   09/10/17 1254  Do not attempt resuscitation (DNR)  Continuous    Question Answer Comment  In the event of cardiac or respiratory ARREST Do not call a "code blue"   In the event of cardiac or respiratory ARREST Do not perform Intubation, CPR, defibrillation or ACLS   In the event of cardiac or respiratory ARREST Use medication by  any route, position, wound care, and other measures to relive pain and suffering. May use oxygen, suction and manual treatment of airway obstruction as needed for comfort.      09/10/17 1253    Code Status History    Date Active Date Inactive Code Status Order ID Comments User Context   09/09/2017 14:25 09/10/2017 12:53 Full Code [id number]  Epifanio Lesches, MD ED   02/13/2017 13:21 02/14/2017 17:31 Full Code [id number]  Dustin Flock, MD Inpatient   01/04/2017 16:14 01/07/2017 19:51 Full Code [id number]  Theodoro Grist, MD Inpatient   10/22/2016 02:28 10/23/2016 18:36 Full Code [id number]  Lance Coon, MD ED   10/16/2016 15:47 10/18/2016 15:25 Full Code [id number]  Theodoro Grist, MD ED   09/10/2016 05:07 09/15/2016 18:26 Full Code [id number]  Harrie Foreman, MD Inpatient      TOTAL TIME TAKING CARE OF THIS PATIENT: 45  minutes.   Note: This dictation was prepared with Dragon dictation along with smaller phrase technology. Any transcriptional errors that result from this process are unintentional.   @[facility] @  on 09/12/2017 at 12:45 PM  Between 7am to 6pm - Pager - [phone]  After 6pm go to [email or web address] - password [email or web address] [facility]  [facility]  Office  [phone]  CC: Primary care physician; Kirk Ruths, MD

## 2017-09-12 NOTE — Clinical Social Work Note (Signed)
CSW met with the patient's family who reported that the facility cannot provide a sitter due to staffing secondary to the impending weather event. The CSW discussed with the medical team who agreed the patient is not safe to discharge without a sitter. The CSW has updated the [facility] of the change. The patient will most likely discharge on Monday.  Santiago Bumpers, MSW, Latanya Presser [phone]

## 2017-09-13 DIAGNOSIS — M25551 Pain in right hip: Secondary | ICD-10-CM | POA: Diagnosis not present

## 2017-09-13 DIAGNOSIS — N179 Acute kidney failure, unspecified: Secondary | ICD-10-CM | POA: Diagnosis not present

## 2017-09-13 LAB — GLUCOSE, CAPILLARY: GLUCOSE-CAPILLARY: 82 mg/dL (ref 65–99)

## 2017-09-13 MED ORDER — SODIUM CHLORIDE 0.9 % IV SOLN
INTRAVENOUS | Status: DC
  Administered 2017-09-13 – 2017-09-14 (×2): via INTRAVENOUS

## 2017-09-13 MED ORDER — METOPROLOL TARTRATE 25 MG PO TABS
12.5000 mg | ORAL_TABLET | Freq: Two times a day (BID) | ORAL | Status: DC
  Administered 2017-09-13 – 2017-09-14 (×4): 12.5 mg via ORAL
  Filled 2017-09-13 (×4): qty 1

## 2017-09-13 NOTE — Progress Notes (Signed)
[facility] [place] NAME: Tammie Mcmahon    MR#:  [id number]  DATE OF BIRTH:  1927-10-09  SUBJECTIVE:  CHIEF COMPLAINT:  Patient is delirious , intermittently agitated Eating some food today.  Could not discharge due to need for sitter, Now she is without sitter in room.  REVIEW OF SYSTEMS:  Review of systems unobtainable  DRUG ALLERGIES:  No Known Allergies  VITALS:  Blood pressure 130/84, pulse 93, temperature 98.8 F (37.1 C), temperature source Oral, resp. rate 20, height 5\' 2"  (1.575 m), weight 51.3 kg (113 lb), SpO2 91 %.  PHYSICAL EXAMINATION:  GENERAL:  83 y.o.-year-old patient lying in the bed with no acute distress.  EYES: Pupils equal, round, reactive to light and accommodation. No scleral icterus. Extraocular muscles intact.  HEENT: Head atraumatic, normocephalic. Oropharynx and nasopharynx clear.  NECK:  Supple, no jugular venous distention. No thyroid enlargement, no tenderness.  LUNGS: Normal breath sounds bilaterally, no wheezing, rales,rhonchi or crepitation. No of accessory muscles of respiration.  CARDIOVASCULAR: S1, S2 normal. No murmurs, rubs, or gallops.  ABDOMEN: Soft, nontender, nondistended. Bowel sounds present.  EXTREMITIES: No pedal edema, cyanosis, or clubbing.  NEUROLOGIC: delerious PSYCHIATRIC: The patient is delerious  SKIN: No obvious rash, lesion, or ulcer.    LABORATORY PANEL:   CBC Recent Labs  Lab 09/11/17 0518  WBC 11.0  HGB 10.8*  HCT 32.7*  PLT 311   ------------------------------------------------------------------------------------------------------------------  Chemistries  Recent Labs  Lab 09/12/17 0447  NA 151*  K 3.6  CL 128*  CO2 20*  GLUCOSE 98  BUN 31*  CREATININE 1.29*  CALCIUM 7.4*   ------------------------------------------------------------------------------------------------------------------  Cardiac Enzymes Recent Labs  Lab 09/09/17 1303   TROPONINI 0.06*   ------------------------------------------------------------------------------------------------------------------  RADIOLOGY:  No results found.  EKG:   Orders placed or performed during the hospital encounter of 09/09/17  . ED EKG  . ED EKG  . EKG 12-Lead  . EKG 12-Lead  . EKG 12-Lead  . EKG 12-Lead    ASSESSMENT AND PLAN:   83 year old female patient with multiple medical problems of hypertension, hyperlipidemia, dementia came in from nursing home because of multiple falls, left hip pain.  1.  Left hip pain likely musculoskeletal patient has no evidence of fracture on x-ray. Pain management as tolerated  2.  Recurrent falls secondary to dementia.  Patient has no evidence of intracranial abnormality, CT spine also use unremarkable.  Get physical therapy evaluation. Need rehab placement.  3.  Acute renal injury likely due to dehydration, patient not eating and drinking well for the past few weeks.   Continue gentle hydration for another 24 hours as per healthcare power of attorney niece and nephews request  avoid ACE inhibitors and other nephrotoxins  4.  Acute fractures on the right fifth ribs: Secondary to fall, patient complains of mild abdominal pain likely coming from the fractures.   Continue incentive spirometry, pain medicines.  5 dementia, depression: Continue Cymbalta.  6.  History of chronic atrial fibrillation: Rate controlled, continue amiodarone  7.  Due to multiple medical problems, dementia patient physician Dr. Ouida Sills requested hospice to follow at nursing home Palliative care is following.  Appreciate palliative care recommendations  8 UTI: Patient recent urine cultures showed Pseudomonas UTI more than 100,000 colonies from November 30 culture results.  Patient was Keflex but during this admission patient is treated with ciprofloxacin but urine cultures are negative for any growth.  Discontinue ciprofloxacin   d/c  keflex.  Disposition if no clinical improvement in the next 24 hours family is considering hospice home rather than hospice care at facility   Will wait till Monday for arrangements.   All the records are reviewed and case discussed with Care Management/Social Workerr. Management plans discussed with the patient's niece and nephew who is the healthcare power of attorney, they are in agreement.  CODE STATUS: dnr  TOTAL TIME TAKING CARE OF THIS PATIENT: 36  minutes.   POSSIBLE D/C IN 1-2  DAYS, DEPENDING ON CLINICAL CONDITION.  Note: This dictation was prepared with Dragon dictation along with smaller phrase technology. Any transcriptional errors that result from this process are unintentional.   [name] M.D on 09/13/2017 at 12:38 PM  Between 7am to 6pm - Pager - [phone]. After 6pm go to [email or web address] - password [email or web address]  [facility]  Office  [phone]  CC: Primary care physician; Kirk Ruths, MD

## 2017-09-14 DIAGNOSIS — M25551 Pain in right hip: Secondary | ICD-10-CM | POA: Diagnosis not present

## 2017-09-14 DIAGNOSIS — N179 Acute kidney failure, unspecified: Secondary | ICD-10-CM | POA: Diagnosis not present

## 2017-09-14 LAB — GLUCOSE, CAPILLARY
GLUCOSE-CAPILLARY: 91 mg/dL (ref 65–99)
Glucose-Capillary: 87 mg/dL (ref 65–99)

## 2017-09-14 MED ORDER — BISACODYL 10 MG RE SUPP
10.0000 mg | Freq: Once | RECTAL | Status: AC
  Administered 2017-09-14: 10 mg via RECTAL
  Filled 2017-09-14: qty 1

## 2017-09-14 MED ORDER — HYDROCODONE-ACETAMINOPHEN 5-325 MG PO TABS: 1.0000 | ORAL_TABLET | Freq: Four times a day (QID) | ORAL | 0 refills | Status: DC | PRN

## 2017-09-14 MED ORDER — METOPROLOL TARTRATE 25 MG PO TABS: 12.5000 mg | ORAL_TABLET | Freq: Two times a day (BID) | ORAL | 0 refills | Status: DC

## 2017-09-14 MED ORDER — HALOPERIDOL 1 MG PO TABS: 1.0000 mg | ORAL_TABLET | Freq: Four times a day (QID) | ORAL | 0 refills | Status: DC | PRN

## 2017-09-14 MED ORDER — DOCUSATE SODIUM 50 MG/5ML PO LIQD
100.0000 mg | Freq: Two times a day (BID) | ORAL | Status: DC
  Administered 2017-09-14: 23:00:00 100 mg via ORAL
  Filled 2017-09-14 (×3): qty 10
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
## 2017-09-14 NOTE — Progress Notes (Signed)
Follow up on new referral for hospice services at [facility] upon discharge from [facility].  Per Maylon Cos SW, patient will need a hospital bed at the facility.  Hospital bed and over bed table ordered by this RN.  I met with patient's niece Adair Laundry to discuss hospice services at [facility].  She states her and patient's HCPOA are all in agreement with and request hospice services.  We discussed at length hospice services as well as inpatient hospice criteria.  This is something the family is interested in if the patient continues to decline.  At this time, patient is receiving fluids and antibiotics for 1 more day.  She is not responsive and the family is not interested in pursuing PEG Placement.  I discussed hospice home appropriateness if the patient is not taking in enough nutrition to sustain life.  Thayer Headings was most worried about Ms. Matuska going back to  The ALF without a hospital bed.  I assured her the bed was ordered and to be delivered prior to discharge.  She was appreciative of the information and will call me if she has further questions.  I left her with both hospice information as well as inpatient hospice information.  Thank you for allowing participation in Ms. Spruell's care.  Dimas Aguas, RN Clinical Nurse Lajoyce Corners
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
## 2017-09-14 NOTE — Discharge Summary (Signed)
[facility] NAME: Tammie Mcmahon    MR#:  [id number]  DATE OF BIRTH:  [birth year]  DATE OF ADMISSION:  09/09/2017 ADMITTING PHYSICIAN: Epifanio Lesches, MD  DATE OF DISCHARGE: 09/14/17  PRIMARY CARE PHYSICIAN: Kirk Ruths, MD    ADMISSION DIAGNOSIS:  Fall [W19.XXXA] Bilateral hip pain [M25.551, M25.552] Acute cystitis with hematuria [N30.01] AKI (acute kidney injury) ([place]) [N17.9] Abrasion of right shoulder, initial encounter [S40.211A] Fall, initial encounter [W19.XXXA]  DISCHARGE DIAGNOSIS:  Active Problems:   Acute kidney injury ([place]) ftt Continue hospice care at facility Continue 3 L of oxygen via nasal cannula   SECONDARY DIAGNOSIS:   Past Medical History:  Diagnosis Date  . (HFpEF) heart failure with preserved ejection fraction ([place])   . Atrial fibrillation and flutter ([place])   . Breast cancer ([place])   . CKD (chronic kidney disease), stage III ([place])   . Hyperlipidemia   . Hypertension   . Low back pain   . Menopausal and postmenopausal disorder   . PA (pernicious anemia)   . Thyroid disease   . Valvular heart disease     HOSPITAL COURSE:   HPI  Tammie Mcmahon  is a 83 y.o. female with a known history of essential hypertension, dementia, recurrent UTIs, recently completed Keflex films from [place] also because of the fall.  Patient fell last night and also this morning.  Patient complained of left hip pain this morning so she came to ER by EMS.  Left hip x-rays are negative for fractures.  Patient complains of abdominal pain.  According to her niece patient had birthday party yesterday and she had few bites of cake  small amount of pizza..  Not eating well for the past few weeks associated with.  Patient has sitter at bedside in the emergency room because she is trying to get out of the bed and   1. Left hip pain likely musculoskeletal patient has no evidence of fracture on x-ray. Pain management  as tolerated  2. Recurrent falls secondary to dementia. Patient has no evidence of intracranial abnormality, CT spine also use unremarkable. Get physical therapy evaluation.  3. Acute renal injury likely due to dehydration, patient not eating and drinking well for the past few weeks.  Clinically improved with IV fluids. Patient started taking by mouth. Encourage by mouth intake  avoid ACE inhibitors and other nephrotoxins  4. Acute fractures on the right fifth ribs: Secondary to fall, patient complains of mild abdominal pain likely coming from the fractures.  Continue incentive spirometry, pain medicines.  5 dementia, depression: Continue Cymbalta.  6. History of chronic atrial fibrillation: Rate controlled, continue amiodarone  7. Due to multiple medical problems, dementia patient physician Dr. Ouida Sills requested hospice to follow at nursing home Palliative care is following.  Appreciate palliative care recommendations  8 UTI: Patient recent urine cultures showed Pseudomonas UTI more than 100,000 colonies from November 30 culture results. Patient was Keflex but during this admission patient is treated with ciprofloxacin but urine cultures are negative for any growth  during this current admission  Discontinue ciprofloxacin   d/c keflex.  pt is being d/c to her facility with hospice care there.  She will need oxygen 2 ltr/min via nasal canula for comfort.   DISCHARGE CONDITIONS:   fair  CONSULTS OBTAINED:     PROCEDURES none   DRUG ALLERGIES:  No Known Allergies  DISCHARGE MEDICATIONS:   Allergies as of 09/14/2017   No Known  Allergies     Medication List    STOP taking these medications   cephALEXin 500 MG capsule Commonly known as:  KEFLEX   cholecalciferol 1000 units tablet Commonly known as:  VITAMIN D   ciprofloxacin 500 MG tablet Commonly known as:  CIPRO   diclofenac sodium 1 % Gel Commonly known as:  VOLTAREN   DULoxetine 60 MG  capsule Commonly known as:  CYMBALTA   levothyroxine 88 MCG tablet Commonly known as:  SYNTHROID, LEVOTHROID   lisinopril 5 MG tablet Commonly known as:  PRINIVIL,ZESTRIL   omeprazole 20 MG capsule Commonly known as:  PRILOSEC   potassium chloride 10 MEQ CR capsule Commonly known as:  MICRO-K   pravastatin 40 MG tablet Commonly known as:  PRAVACHOL     TAKE these medications   amiodarone 200 MG tablet Commonly known as:  PACERONE Take 200 mg by mouth daily. (0800)   aspirin 81 MG chewable tablet Chew 81 mg by mouth daily. (0800)   azelastine 0.1 % nasal spray Commonly known as:  ASTELIN Place 1 spray into both nostrils daily as needed for allergies.   haloperidol 1 MG tablet Commonly known as:  HALDOL Take 1 tablet (1 mg total) by mouth every 6 (six) hours as needed for agitation.   HYDROcodone-acetaminophen 5-325 MG tablet Commonly known as:  NORCO Take 1 tablet by mouth every 6 (six) hours as needed for moderate pain.   lidocaine 5 % Commonly known as:  LIDODERM Place 1 patch onto the skin daily. Remove & Discard patch within 12 hours or as directed by MD ON at 0800, & OFF at 2000   magnesium hydroxide 400 MG/5ML suspension Commonly known as:  MILK OF MAGNESIA Take 30 mLs by mouth every other day as needed (for constipation).   metoprolol tartrate 25 MG tablet Commonly known as:  LOPRESSOR Take 0.5 tablets (12.5 mg total) by mouth 2 (two) times daily.   NUTRITIONAL SHAKE PO Take 1 Bottle by mouth 3 (three) times daily.   ondansetron 4 MG tablet Commonly known as:  ZOFRAN Take 1 tablet (4 mg total) by mouth every 6 (six) hours as needed for nausea.   pantoprazole sodium 40 mg/20 mL Pack Commonly known as:  PROTONIX Take 20 mLs (40 mg total) by mouth daily.   polyethylene glycol packet Commonly known as:  MIRALAX / GLYCOLAX Take 17 g by mouth daily as needed for moderate constipation. What changed:  when to take this   risperiDONE 1 MG  tablet Commonly known as:  RISPERDAL Take 1 tablet (1 mg total) by mouth 2 (two) times daily.   senna 8.6 MG Tabs tablet Commonly known as:  SENOKOT Take 1 tablet (8.6 mg total) by mouth 2 (two) times daily.   traZODone 50 MG tablet Commonly known as:  DESYREL Take 0.5 tablets (25 mg total) by mouth at bedtime as needed for sleep.   TYLENOL 8 HOUR ARTHRITIS PAIN 650 MG CR tablet Generic drug:  acetaminophen Take 650-1,300 mg by mouth 2 (two) times daily. (0800 & 2000)        DISCHARGE INSTRUCTIONS:   Continue hospice care at the facility Follow-up by primary care physician at the facility in 2-3 days Continue 3 L of oxygen via nasal cannula Provide feeding assistance Sitter for safety  DIET:  Dysphagia 3 diet  DISCHARGE CONDITION:  Fair  ACTIVITY:  Activity as tolerated  OXYGEN:  Home Oxygen: Yes.     Oxygen Delivery: 3 liters/min via Patient connected to  nasal cannula oxygen  DISCHARGE LOCATION:  [facility] to continue hospice care at the facility   If you experience worsening of your admission symptoms, develop shortness of breath, life threatening emergency, suicidal or homicidal thoughts you must seek medical attention immediately by calling 911 or calling your MD immediately  if symptoms less severe.  You Must read complete instructions/literature along with all the possible adverse reactions/side effects for all the Medicines you take and that have been prescribed to you. Take any new Medicines after you have completely understood and accpet all the possible adverse reactions/side effects.   Please note  You were cared for by a hospitalist during your hospital stay. If you have any questions about your discharge medications or the care you received while you were in the hospital after you are discharged, you can call the unit and asked to speak with the hospitalist on call if the hospitalist that took care of you is not  available. Once you are discharged, your primary care physician will handle any further medical issues. Please note that NO REFILLS for any discharge medications will be authorized once you are discharged, as it is imperative that you return to your primary care physician (or establish a relationship with a primary care physician if you do not have one) for your aftercare needs so that they can reassess your need for medications and monitor your lab values.   Today  Chief Complaint  Patient presents with  . Fall   Patient is demented, but more alert today and tolerating diet. Patient niece and nephew are requesting to transfer the patient to [place] with hospice care  ROS: Unobtainable VITAL SIGNS:  Blood pressure (!) 146/67, pulse 83, temperature 98.3 F (36.8 C), temperature source Oral, resp. rate 18, height 5\' 2"  (1.575 m), weight 56.5 kg (124 lb 9.6 oz), SpO2 93 %.  I/O:    Intake/Output Summary (Last 24 hours) at 09/14/2017 1206 Last data filed at 09/14/2017 0300 Gross per 24 hour  Intake 5135.75 ml  Output -  Net 5135.75 ml    PHYSICAL EXAMINATION:  GENERAL:  83 y.o.-year-old patient lying in the bed with no acute distress.  EYES: Pupils equal, round, reactive to light and accommodation. No scleral icterus.   HEENT: Head atraumatic, normocephalic. Oropharynx and nasopharynx clear.  NECK:  Supple, no jugular venous distention. No thyroid enlargement, no tenderness.  LUNGS: Normal breath sounds bilaterally, no wheezing, rales,rhonchi or crepitation. No use of accessory muscles of respiration.  CARDIOVASCULAR: S1, S2 normal. No murmurs, rubs, or gallops.  ABDOMEN: Soft, non-tender, non-distended. Bowel sounds present.  EXTREMITIES: No pedal edema, cyanosis, or clubbing.  NEUROLOGIC: Patient is awake, disoriented  PSYCHIATRIC: The patient is disoriented SKIN: No obvious rash, lesion, or ulcer.   DATA REVIEW:   CBC Recent Labs  Lab 09/11/17 0518  WBC 11.0   HGB 10.8*  HCT 32.7*  PLT 311    Chemistries  Recent Labs  Lab 09/12/17 0447  NA 151*  K 3.6  CL 128*  CO2 20*  GLUCOSE 98  BUN 31*  CREATININE 1.29*  CALCIUM 7.4*    Cardiac Enzymes Recent Labs  Lab 09/09/17 1303  TROPONINI 0.06*    Microbiology Results  Results for orders placed or performed during the hospital encounter of 09/09/17  MRSA PCR Screening     Status: None   Collection Time: 09/09/17  4:43 PM  Result Value Ref Range Status   MRSA by PCR NEGATIVE NEGATIVE Final  Comment:        The GeneXpert MRSA Assay (FDA approved for NASAL specimens only), is one component of a comprehensive MRSA colonization surveillance program. It is not intended to diagnose MRSA infection nor to guide or monitor treatment for MRSA infections.   Urine Culture     Status: None   Collection Time: 09/10/17 11:40 AM  Result Value Ref Range Status   Specimen Description URINE, CATHETERIZED  Final   Special Requests Normal  Final   Culture   Final    NO GROWTH Performed at [facility], [address] [place], [place] [address]    Report Status 09/11/2017 FINAL  Final    RADIOLOGY:  No results found.  EKG:   Orders placed or performed during the hospital encounter of 09/09/17  . ED EKG  . ED EKG  . EKG 12-Lead  . EKG 12-Lead  . EKG 12-Lead  . EKG 12-Lead      Management plans discussed with the patient, family and they are in agreement.  CODE STATUS:     Code Status Orders  (From admission, onward)        Start     Ordered   09/10/17 1254  Do not attempt resuscitation (DNR)  Continuous    Question Answer Comment  In the event of cardiac or respiratory ARREST Do not call a "code blue"   In the event of cardiac or respiratory ARREST Do not perform Intubation, CPR, defibrillation or ACLS   In the event of cardiac or respiratory ARREST Use medication by any route, position, wound care, and other measures to relive pain and suffering. May use  oxygen, suction and manual treatment of airway obstruction as needed for comfort.      09/10/17 1253    Code Status History    Date Active Date Inactive Code Status Order ID Comments User Context   09/09/2017 14:25 09/10/2017 12:53 Full Code [id number]  Epifanio Lesches, MD ED   02/13/2017 13:21 02/14/2017 17:31 Full Code [id number]  Dustin Flock, MD Inpatient   01/04/2017 16:14 01/07/2017 19:51 Full Code [id number]  Theodoro Grist, MD Inpatient   10/22/2016 02:28 10/23/2016 18:36 Full Code [id number]  Lance Coon, MD ED   10/16/2016 15:47 10/18/2016 15:25 Full Code [id number]  Theodoro Grist, MD ED   09/10/2016 05:07 09/15/2016 18:26 Full Code [id number]  Harrie Foreman, MD Inpatient      TOTAL TIME TAKING CARE OF THIS PATIENT: 45  minutes.   Note: This dictation was prepared with Dragon dictation along with smaller phrase technology. Any transcriptional errors that result from this process are unintentional.   @M[facility] @  on 09/14/2017 at 12:06 PM  Between 7am to 6pm - Pager - [phone]  After 6pm go to [email or web address] - password [email or web address] [facility]  [facility]  Office  [phone]  CC: Primary care physician; Kirk Ruths, MD

## 2017-09-14 NOTE — Care Management Important Message (Signed)
Important Message  Patient Details  Name: Tammie Mcmahon MRN: [id number] Date of Birth: 1928/02/10   Medicare Important Message Given:  Yes    Marshell Garfinkel, RN 09/14/2017, 9:09 AM

## 2017-09-14 NOTE — Progress Notes (Signed)
Follow up visit to new referral for [facility] at [facility]. Patient seen lying in bed, will speak and answer simple questions. She continues with poor appetite, does drink [employer] and is taking oral medications. She also remains on IV fluids at this time. No family at bedside. Hospital bed was ordered and delivered to [facility] on Friday 12/7, however oxygen was not ordered at that time. Patient is now requiring oxygen and this cannot be delivered until tomorrow 12/11. Hospital Care team made aware. Oxygen has been ordered through hospice triage. Will continue to follow through final discharge. Thank you. Flo Shanks RN, BSN, [place] [facility] [place], hospital Liaison [phone]

## 2017-09-15 DIAGNOSIS — N179 Acute kidney failure, unspecified: Secondary | ICD-10-CM | POA: Diagnosis not present

## 2017-09-15 DIAGNOSIS — L8990 Pressure ulcer of unspecified site, unspecified stage: Secondary | ICD-10-CM

## 2017-09-15 DIAGNOSIS — M25551 Pain in right hip: Secondary | ICD-10-CM | POA: Diagnosis not present

## 2017-09-15 LAB — GLUCOSE, CAPILLARY
Glucose-Capillary: 94 mg/dL (ref 65–99)
Glucose-Capillary: 72 mg/dL (ref 65–99)

## 2017-09-15 MED ORDER — DULOXETINE HCL 60 MG PO CPEP: 60.0000 mg | ORAL_CAPSULE | Freq: Every day | ORAL | 0 refills | Status: DC

## 2017-09-15 MED ORDER — LORAZEPAM 0.5 MG PO TABS: 0.5000 mg | ORAL_TABLET | Freq: Three times a day (TID) | ORAL | 0 refills | Status: AC | PRN

## 2017-09-15 MED ORDER — DOCUSATE SODIUM 50 MG/5ML PO LIQD: 100.0000 mg | Freq: Two times a day (BID) | ORAL | 0 refills | Status: DC

## 2017-09-15 NOTE — Progress Notes (Signed)
Report called to Denna Haggard at [facility] [phone]). Requested she call the unit once oxygen arrives from hospice this afternoon so we know when patient can be transported.

## 2017-09-15 NOTE — Consult Note (Signed)
[facility] Nurse wound consult note Reason for Consult: Deep tissue injury to coccyx.  Deep tissue injury to left medial heel and unstageable pressure injury to right medial knee.  Bony prominence from left leg at knee caused this.  All present on admission. Albumin is currently 3.8 but recent decline in PO intake and will be moved to Hospice care per nurse.  Wound type:Full thickness pressure injury, present on admission.  Pressure Injury POA: Yes Measurement: Coccyx:  1.5 cm x 1 cm intact maroon discoloration.  Will offload pressure and monitor.  LEft medial heel:  1 cm diameter maroon discoloration intact Right medial knee:  2 cm x 1 cm 100% slough Wound JOA:CZYSAY maroon discoloration and 100% slough Drainage (amount, consistency, odor) minimal serosanguinous to knee wound Periwound:intact Dressing procedure/placement/frequency:Cleanse wounds to sacrum, heel and knee withNS.  Apply silicone border foam dressing and change every three days and PRN soilage.  Is discharging to SNF for hospice care.  Will not order low air loss bed due to this, but will continue to turn and reposition every two hours.  Pillow between knees as well.  Will not follow at this time.  Please re-consult if needed.  Domenic Moras RN BSN [place] Pager [phone]

## 2017-09-15 NOTE — Progress Notes (Signed)
Patient was not alert enough this morning to take her oral medications, IV fluids were stopped yesterday afternoon and she is only drinking sips of nutritional shake, not eating. No wet brief today. Writer spoke at length with patient's nephew Avel Peace, he has chosen to focus on her comfort with transfer to the hospice home. Questions answered, consents signed. Hospital care team all updated and aware. Updated notes and discharge summary faxed to referral. Report called to the hospice home. EMS notified for a 4:30 pickup. Will continue to follow through final disposition. Thank you. Flo Shanks RN, BSN, [facility] [place], hospital Liaison [phone]

## 2017-09-15 NOTE — NC FL2 (Signed)
[place] LEVEL OF CARE SCREENING TOOL     IDENTIFICATION  Patient Name: Tammie Mcmahon Birthdate: 1928-07-27 Sex: female Admission Date (Current Location): 09/09/2017  [place] and [place] Number:  Engineering geologist and Address:  [facility], [address] [place], [place] [address]      Provider Number: [id number]  Attending Physician Name and Address:  [name], *  Relative Name and Phone Number:       Current Level of Care: Hospital Recommended Level of Care: [place], Other (Comment) Prior Approval Number:    Date Approved/Denied:   PASRR Number:    Discharge Plan: Domiciliary (Rest home)    Current Diagnoses: Patient Active Problem List   Diagnosis Date Noted  . Pressure injury of skin 09/15/2017  . Acute kidney injury ([place]) 09/09/2017  . Fall 02/13/2017  . Syncope 01/04/2017  . Lower abdominal pain 01/04/2017  . CKD (chronic kidney disease), stage III ([place]) 01/04/2017  . Elevated troponin 01/04/2017  . Hypokalemia 01/04/2017  . Leukocytosis 01/04/2017  . Dysuria 01/04/2017  . Constipation 01/04/2017  . Symptomatic bradycardia 10/22/2016  . Atrial fibrillation ([place]) 10/22/2016  . Heart failure with preserved ejection fraction ([place]) 10/22/2016  . Hypertension 10/22/2016  . Hyperlipidemia 10/22/2016  . Acute on chronic renal failure ([place]) 10/22/2016  . Other specified cardiac arrhythmias (CODE) 10/16/2016  . Confusion 09/10/2016    Orientation RESPIRATION BLADDER Height & Weight     Self  O2(2L) Indwelling catheter Weight: 127 lb (57.6 kg) Height:  5\' 2"  (157.5 cm)  BEHAVIORAL SYMPTOMS/MOOD NEUROLOGICAL BOWEL NUTRITION STATUS      Incontinent Diet(Dysphagia 3, Thin Liquids)  AMBULATORY STATUS COMMUNICATION OF NEEDS Skin   Extensive Assist Verbally Normal                       Personal Care Assistance Level of Assistance  Feeding, Bathing, Dressing Bathing  Assistance: Limited assistance Feeding assistance: Limited assistance Dressing Assistance: Limited assistance     Functional Limitations Info             SPECIAL CARE FACTORS FREQUENCY  ([facility])                    Contractures Contractures Info: Not present    Additional Factors Info  Code Status, Allergies, Psychotropic Code Status Info: DNR Allergies Info: No known Allergies Psychotropic Info: Haldol PRN         Current Medications (09/15/2017):  This is the current hospital active medication list Current Facility-Administered Medications  Medication Dose Route Frequency Provider Last Rate Last Dose  . acetaminophen (TYLENOL) tablet 650 mg  650 mg Oral Q6H PRN Epifanio Lesches, MD       Or  . acetaminophen (TYLENOL) suppository 650 mg  650 mg Rectal Q6H PRN Epifanio Lesches, MD      . amiodarone (PACERONE) tablet 200 mg  200 mg Oral Daily Epifanio Lesches, MD   200 mg at 09/14/17 0947  . aspirin chewable tablet 81 mg  81 mg Oral Daily Epifanio Lesches, MD   81 mg at 09/14/17 0949  . azelastine (ASTELIN) 0.1 % nasal spray 1 spray  1 spray Each Nare Daily PRN Epifanio Lesches, MD      . bisacodyl (DULCOLAX) EC tablet 5 mg  5 mg Oral Daily PRN Epifanio Lesches, MD      . cholecalciferol (VITAMIN D) tablet 1,000 Units  1,000 Units Oral  Daily Epifanio Lesches, MD   1,000 Units at 09/14/17 [phone]  . docusate (COLACE) 50 MG/5ML liquid 100 mg  100 mg Oral BID Salary, Montell D, MD   100 mg at 09/14/17 2300  . DULoxetine (CYMBALTA) DR capsule 60 mg  60 mg Oral Daily Epifanio Lesches, MD   60 mg at 09/14/17 0949  . feeding supplement (ENSURE ENLIVE) (ENSURE ENLIVE) liquid 237 mL  237 mL Oral TID BM Gouru, Aruna, MD   237 mL at 09/14/17 2100  . haloperidol (HALDOL) tablet 1 mg  1 mg Oral Q6H PRN Gouru, Aruna, MD       Or  . haloperidol lactate (HALDOL) injection 1 mg  1 mg Intramuscular Q6H PRN Gouru, Aruna,  MD   1 mg at 09/10/17 1042  . haloperidol lactate (HALDOL) injection 1 mg  1 mg Intravenous Q6H PRN Loletha Grayer, MD   1 mg at 09/12/17 1211  . heparin injection 5,000 Units  5,000 Units Subcutaneous Q8H Epifanio Lesches, MD   5,000 Units at 09/15/17 0505  . levothyroxine (SYNTHROID, LEVOTHROID) tablet 88 mcg  88 mcg Oral QAC breakfast Epifanio Lesches, MD   88 mcg at 09/15/17 0650  . magnesium hydroxide (MILK OF MAGNESIA) suspension 30 mL  30 mL Oral Q48H PRN Epifanio Lesches, MD   30 mL at 09/14/17 0949  . metoprolol tartrate (LOPRESSOR) tablet 12.5 mg  12.5 mg Oral BID [name], MD   12.5 mg at 09/14/17 2205  . morphine 2 MG/ML injection 2 mg  2 mg Intravenous Q4H PRN Loletha Grayer, MD   2 mg at 09/11/17 1840  . ondansetron (ZOFRAN) tablet 4 mg  4 mg Oral Q6H PRN Epifanio Lesches, MD       Or  . ondansetron (ZOFRAN) injection 4 mg  4 mg Intravenous Q6H PRN Epifanio Lesches, MD      . oxyCODONE-acetaminophen (PERCOCET/ROXICET) 5-325 MG per tablet 1 tablet  1 tablet Oral Q6H PRN Epifanio Lesches, MD   1 tablet at 09/15/17 0505  . pantoprazole sodium (PROTONIX) 40 mg/20 mL oral suspension 40 mg  40 mg Oral Daily Gouru, Aruna, MD   40 mg at 09/14/17 0949  . pneumococcal 23 valent vaccine (PNU-IMMUNE) injection 0.5 mL  0.5 mL Intramuscular Tomorrow-1000 Epifanio Lesches, MD      . risperiDONE (RISPERDAL) tablet 1 mg  1 mg Oral BID Asencion Gowda, NP   1 mg at 09/14/17 2205  . traZODone (DESYREL) tablet 25 mg  25 mg Oral QHS PRN Epifanio Lesches, MD   25 mg at 09/13/17 2048     Discharge Medications: Please see discharge summary for a list of discharge medications.  STOP taking these medications   amiodarone 200 MG tablet Commonly known as:  PACERONE   aspirin 81 MG chewable tablet   cephALEXin 500 MG capsule Commonly known as:  KEFLEX   cholecalciferol 1000 units tablet Commonly known as:  VITAMIN D   ciprofloxacin 500 MG  tablet Commonly known as:  CIPRO   diclofenac sodium 1 % Gel Commonly known as:  VOLTAREN   levothyroxine 88 MCG tablet Commonly known as:  SYNTHROID, LEVOTHROID   lisinopril 5 MG tablet Commonly known as:  PRINIVIL,ZESTRIL   omeprazole 20 MG capsule Commonly known as:  PRILOSEC   potassium chloride 10 MEQ CR capsule Commonly known as:  MICRO-K   pravastatin 40 MG tablet Commonly known as:  PRAVACHOL     TAKE these medications   azelastine 0.1 % nasal spray Commonly known as:  ASTELIN Place 1 spray into both nostrils daily as needed for allergies.   docusate 50 MG/5ML liquid Commonly known as:  COLACE Take 10 mLs (100 mg total) by mouth 2 (two) times daily.   DULoxetine 60 MG capsule Commonly known as:  CYMBALTA Take 1 capsule (60 mg total) by mouth daily. (0800)   HYDROcodone-acetaminophen 5-325 MG tablet Commonly known as:  NORCO Take 1 tablet by mouth every 6 (six) hours as needed for moderate pain.   lidocaine 5 % Commonly known as:  LIDODERM Place 1 patch onto the skin daily. Remove & Discard patch within 12 hours or as directed by MD ON at 0800, & OFF at 2000   magnesium hydroxide 400 MG/5ML suspension Commonly known as:  MILK OF MAGNESIA Take 30 mLs by mouth every other day as needed (for constipation).   metoprolol tartrate 25 MG tablet Commonly known as:  LOPRESSOR Take 0.5 tablets (12.5 mg total) by mouth 2 (two) times daily.   NUTRITIONAL SHAKE PO Take 1 Bottle by mouth 3 (three) times daily.   ondansetron 4 MG tablet Commonly known as:  ZOFRAN Take 1 tablet (4 mg total) by mouth every 6 (six) hours as needed for nausea.   polyethylene glycol packet Commonly known as:  MIRALAX / GLYCOLAX Take 17 g by mouth daily as needed for moderate constipation. What changed:  when to take this   risperiDONE 1 MG tablet Commonly known as:  RISPERDAL Take 1 tablet (1 mg total) by mouth 2 (two) times daily.   senna 8.6 MG Tabs  tablet Commonly known as:  SENOKOT Take 1 tablet (8.6 mg total) by mouth 2 (two) times daily.   TYLENOL 8 HOUR ARTHRITIS PAIN 650 MG CR tablet Generic drug:  acetaminophen Take 650-1,300 mg by mouth 2 (two) times daily. (0800 & 2000)        DISCHARGE INSTRUCTIONS:   Continue hospice care at the facility Follow-up by primary care physician at the facility in 2-3 days Continue 3 L of oxygen via nasal cannula Provide feeding assistance     Relevant Imaging Results:  Relevant Lab Results:   Additional Information    Anterhaus, Jones Broom, LCSWA

## 2017-09-15 NOTE — Discharge Summary (Addendum)
[facility] NAME: Tammie Mcmahon    MR#:  [id number]  DATE OF BIRTH:  04-25-1928  DATE OF ADMISSION:  09/09/2017 ADMITTING PHYSICIAN: Epifanio Lesches, MD  DATE OF DISCHARGE: 09/14/17  PRIMARY CARE PHYSICIAN: Kirk Ruths, MD    ADMISSION DIAGNOSIS:  Fall [W19.XXXA] Bilateral hip pain [M25.551, M25.552] Acute cystitis with hematuria [N30.01] AKI (acute kidney injury) ([place]) [N17.9] Abrasion of right shoulder, initial encounter [S40.211A] Fall, initial encounter [W19.XXXA]  DISCHARGE DIAGNOSIS:  Active Problems:   Acute kidney injury ([place])   Pressure injury of skin ftt Continue hospice care at facility Continue 3 L of oxygen via nasal cannula   SECONDARY DIAGNOSIS:   Past Medical History:  Diagnosis Date  . (HFpEF) heart failure with preserved ejection fraction ([place])   . Atrial fibrillation and flutter ([place])   . Breast cancer ([place])   . CKD (chronic kidney disease), stage III ([place])   . Hyperlipidemia   . Hypertension   . Low back pain   . Menopausal and postmenopausal disorder   . PA (pernicious anemia)   . Thyroid disease   . Valvular heart disease     HOSPITAL COURSE:   HPI  Tammie Mcmahon  is a 83 y.o. female with a known history of essential hypertension, dementia, recurrent UTIs, recently completed Keflex films from [place] also because of the fall.  Patient fell last night and also this morning.  Patient complained of left hip pain this morning so she came to ER by EMS.  Left hip x-rays are negative for fractures.  Patient complains of abdominal pain.  According to her niece patient had birthday party yesterday and she had few bites of cake  small amount of pizza..  Not eating well for the past few weeks associated with.    1. Left hip pain likely musculoskeletal patient has no evidence of fracture on x-ray. Pain management as tolerated  2. Recurrent falls secondary to dementia. Patient has  no evidence of intracranial abnormality, CT spine also use unremarkable. physical therapy evaluation.  3. Acute renal injury likely due to dehydration, patient not eating and drinking well for the past few weeks.  Clinically improved with IV fluids. Patient started taking by mouth. Encourage by mouth intake  avoid ACE inhibitors and other nephrotoxins  4. Acute fractures on the right fifth ribs: Secondary to fall, patient complains of mild abdominal pain likely coming from the fractures.  Continue incentive spirometry, pain medicines.  5 dementia, depression: Continue Cymbalta.  6. History of chronic atrial fibrillation: Rate controlled, continue amiodarone  7. Due to multiple medical problems, dementia patient physician Dr. Ouida Sills requested hospice  Palliative care is following.  Appreciate palliative care recommendations  8 UTI: Patient recent urine cultures showed Pseudomonas UTI more than 100,000 colonies from November 30 culture results. Patient was Keflex but during this admission patient is treated with ciprofloxacin but urine cultures are negative for any growth  during this current admission  Discontinue ciprofloxacin   d/c keflex.  pt is being d/c to hospice facility.  She will need oxygen 2 ltr/min via nasal canula for comfort.   DISCHARGE CONDITIONS:   fair  CONSULTS OBTAINED:   PROCEDURES none   DRUG ALLERGIES:  No Known Allergies  DISCHARGE MEDICATIONS:   Allergies as of 09/15/2017   No Known Allergies     Medication List    STOP taking these medications   amiodarone 200 MG tablet Commonly known as:  PACERONE  aspirin 81 MG chewable tablet   azelastine 0.1 % nasal spray Commonly known as:  ASTELIN   cephALEXin 500 MG capsule Commonly known as:  KEFLEX   cholecalciferol 1000 units tablet Commonly known as:  VITAMIN D   ciprofloxacin 500 MG tablet Commonly known as:  CIPRO   diclofenac sodium 1 % Gel Commonly known as:   VOLTAREN   DULoxetine 60 MG capsule Commonly known as:  CYMBALTA   HYDROcodone-acetaminophen 5-325 MG tablet Commonly known as:  NORCO   levothyroxine 88 MCG tablet Commonly known as:  SYNTHROID, LEVOTHROID   lidocaine 5 % Commonly known as:  LIDODERM   lisinopril 5 MG tablet Commonly known as:  PRINIVIL,ZESTRIL   magnesium hydroxide 400 MG/5ML suspension Commonly known as:  MILK OF MAGNESIA   NUTRITIONAL SHAKE PO   omeprazole 20 MG capsule Commonly known as:  PRILOSEC   polyethylene glycol packet Commonly known as:  MIRALAX / GLYCOLAX   potassium chloride 10 MEQ CR capsule Commonly known as:  MICRO-K   pravastatin 40 MG tablet Commonly known as:  PRAVACHOL   senna 8.6 MG Tabs tablet Commonly known as:  SENOKOT   TYLENOL 8 HOUR ARTHRITIS PAIN 650 MG CR tablet Generic drug:  acetaminophen     TAKE these medications   LORazepam 0.5 MG tablet Commonly known as:  ATIVAN Take 1 tablet (0.5 mg total) by mouth every 8 (eight) hours as needed for anxiety.   risperiDONE 1 MG tablet Commonly known as:  RISPERDAL Take 1 tablet (1 mg total) by mouth 2 (two) times daily.        DISCHARGE INSTRUCTIONS:   Continue hospice care . Follow-up by primary care physician in 2-3 days Continue 3 L of oxygen via nasal cannula. Provide feeding assistance.   DIET:  Dysphagia 3 diet  DISCHARGE CONDITION:  Fair  ACTIVITY:  Activity as tolerated  OXYGEN:  Home Oxygen: Yes.     Oxygen Delivery: 3 liters/min via Patient connected to nasal cannula oxygen  DISCHARGE LOCATION:  [facility] to continue hospice care at the facility   If you experience worsening of your admission symptoms, develop shortness of breath, life threatening emergency, suicidal or homicidal thoughts you must seek medical attention immediately by calling 911 or calling your MD immediately  if symptoms less severe.  You Must read complete instructions/literature along  with all the possible adverse reactions/side effects for all the Medicines you take and that have been prescribed to you. Take any new Medicines after you have completely understood and accpet all the possible adverse reactions/side effects.   Please note  You were cared for by a hospitalist during your hospital stay. If you have any questions about your discharge medications or the care you received while you were in the hospital after you are discharged, you can call the unit and asked to speak with the hospitalist on call if the hospitalist that took care of you is not available. Once you are discharged, your primary care physician will handle any further medical issues. Please note that NO REFILLS for any discharge medications will be authorized once you are discharged, as it is imperative that you return to your primary care physician (or establish a relationship with a primary care physician if you do not have one) for your aftercare needs so that they can reassess your need for medications and monitor your lab values.   Today  Chief Complaint  Patient presents with  . Fall   Patient is  demented, but more alert today and tolerating diet. Patient niece and nephew are requesting to transfer the patient to [place] with hospice care  ROS: Unobtainable VITAL SIGNS:  Blood pressure (!) 111/53, pulse 61, temperature 97.6 F (36.4 C), temperature source Oral, resp. rate 17, height 5\' 2"  (1.575 m), weight 57.6 kg (127 lb), SpO2 96 %.  I/O:    Intake/Output Summary (Last 24 hours) at 09/15/2017 1440 Last data filed at 09/15/2017 1300 Gross per 24 hour  Intake 240 ml  Output -  Net 240 ml    PHYSICAL EXAMINATION:  GENERAL:  83 y.o.-year-old patient lying in the bed with no acute distress.  EYES: Pupils equal, round, reactive to light and accommodation. No scleral icterus.   HEENT: Head atraumatic, normocephalic. Oropharynx and nasopharynx clear.  NECK:  Supple, no jugular  venous distention. No thyroid enlargement, no tenderness.  LUNGS: Normal breath sounds bilaterally, no wheezing, rales,rhonchi or crepitation. No use of accessory muscles of respiration.  CARDIOVASCULAR: S1, S2 normal. No murmurs, rubs, or gallops.  ABDOMEN: Soft, non-tender, non-distended. Bowel sounds present.  EXTREMITIES: No pedal edema, cyanosis, or clubbing.  NEUROLOGIC: Patient is drowsy  PSYCHIATRIC: The patient is disoriented SKIN: No obvious rash, lesion, or ulcer.   DATA REVIEW:   CBC Recent Labs  Lab 09/11/17 0518  WBC 11.0  HGB 10.8*  HCT 32.7*  PLT 311    Chemistries  Recent Labs  Lab 09/12/17 0447  NA 151*  K 3.6  CL 128*  CO2 20*  GLUCOSE 98  BUN 31*  CREATININE 1.29*  CALCIUM 7.4*    Cardiac Enzymes Recent Labs  Lab 09/09/17 1303  TROPONINI 0.06*    Microbiology Results  Results for orders placed or performed during the hospital encounter of 09/09/17  MRSA PCR Screening     Status: None   Collection Time: 09/09/17  4:43 PM  Result Value Ref Range Status   MRSA by PCR NEGATIVE NEGATIVE Final    Comment:        The GeneXpert MRSA Assay (FDA approved for NASAL specimens only), is one component of a comprehensive MRSA colonization surveillance program. It is not intended to diagnose MRSA infection nor to guide or monitor treatment for MRSA infections.   Urine Culture     Status: None   Collection Time: 09/10/17 11:40 AM  Result Value Ref Range Status   Specimen Description URINE, CATHETERIZED  Final   Special Requests Normal  Final   Culture   Final    NO GROWTH Performed at [facility], [address] [place], [place] [address]    Report Status 09/11/2017 FINAL  Final    RADIOLOGY:  No results found.  EKG:   Orders placed or performed during the hospital encounter of 09/09/17  . ED EKG  . ED EKG  . EKG 12-Lead  . EKG 12-Lead  . EKG 12-Lead  . EKG 12-Lead      Management plans discussed with the patient,  family and they are in agreement.  CODE STATUS: DNR.    Code Status Orders  (From admission, onward)        Start     Ordered   09/10/17 1254  Do not attempt resuscitation (DNR)  Continuous    Question Answer Comment  In the event of cardiac or respiratory ARREST Do not call a "code blue"   In the event of cardiac or respiratory ARREST Do not perform Intubation, CPR, defibrillation or ACLS   In  the event of cardiac or respiratory ARREST Use medication by any route, position, wound care, and other measures to relive pain and suffering. May use oxygen, suction and manual treatment of airway obstruction as needed for comfort.      09/10/17 1253    Code Status History    Date Active Date Inactive Code Status Order ID Comments User Context   09/09/2017 14:25 09/10/2017 12:53 Full Code [id number]  Epifanio Lesches, MD ED   02/13/2017 13:21 02/14/2017 17:31 Full Code [id number]  Dustin Flock, MD Inpatient   01/04/2017 16:14 01/07/2017 19:51 Full Code [id number]  Theodoro Grist, MD Inpatient   10/22/2016 02:28 10/23/2016 18:36 Full Code [id number]  Lance Coon, MD ED   10/16/2016 15:47 10/18/2016 15:25 Full Code [id number]  Theodoro Grist, MD ED   09/10/2016 05:07 09/15/2016 18:26 Full Code [id number]  Harrie Foreman, MD Inpatient      TOTAL TIME TAKING CARE OF THIS PATIENT: 32  minutes.   Note: This dictation was prepared with Dragon dictation along with smaller phrase technology. Any transcriptional errors that result from this process are unintentional.   @[facility] @  on 09/15/2017 at 2:40 PM  Between 7am to 6pm - Pager - [phone]  After 6pm go to [email or web address] - password [email or web address] [facility]  [facility]  Office  [phone]  CC: Primary care physician; Kirk Ruths, MD

## 2017-09-15 NOTE — Clinical Social Work Note (Addendum)
Patient's family have decided they would like patient to go to [facility] now, not to her ALF.  CSW updated [facility] to let them know and CSW also updated physician.  CSW spoke to [facility] liaison who said there is a bed available for discharge today to hospice facility.  Patient to be d/c'ed today to [facility].  Patient and family agreeable to plans will transport via ems hospice nurse liasion to call report.  Tammie Mcmahon, MSW, [place]  09/15/2017 2:29 PM

## 2017-09-15 NOTE — Progress Notes (Signed)
[facility] notified patient will not be returning. Pt being discharged to hospice home.

## 2017-09-15 NOTE — Plan of Care (Addendum)
Inpatient hospice referral made at request of Dr. Margaretmary Eddy and family for tomorrow. Family does not want patient returned to SNF. Given current trajectory, anticipate < 2  weeks given very poor PO intake.

## 2017-10-06 DEATH — deceased

## 2018-03-20 IMAGING — CR DG CHEST 1V PORT
1 series · 1 of 1 positions shown · non-contrast
Comparison: Chest radiograph September 09, 2016

CLINICAL DATA: Weakness today. History of breast cancer, diabetes,
heart failure.

EXAM:
PORTABLE CHEST 1 VIEW

[chest ap]
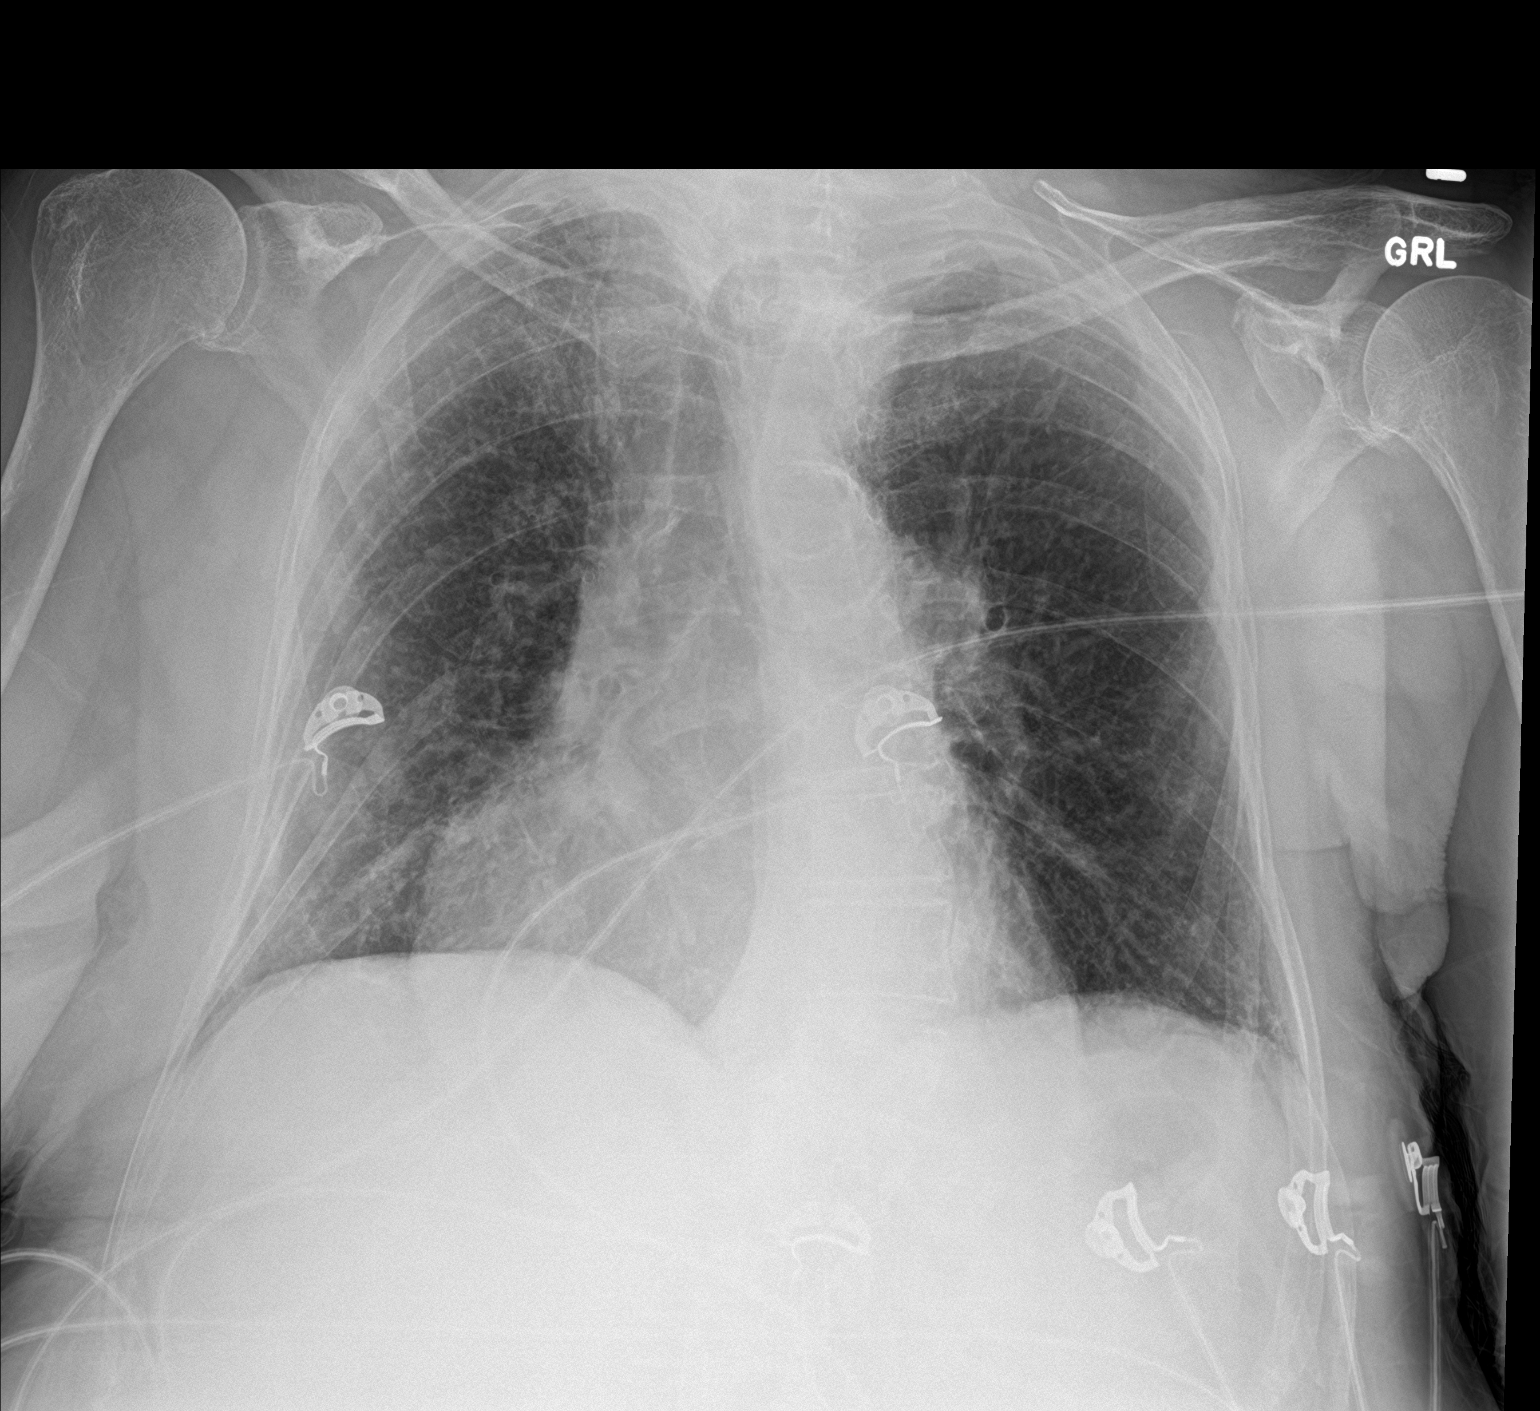

[1 of 1 positions shown; findings below may reference images not displayed]

FINDINGS: Cardiac silhouette is mildly enlarged an unchanged. Mediastinal
silhouette is nonsuspicious. Chronic interstitial changes without
pleural effusion or focal consolidation. No pneumothorax. Mild
biapical pleural thickening. Osteopenia. Lower thoracic
levoscoliosis. Soft tissue planes are nonsuspicious.
IMPRESSION: Mild cardiomegaly and chronic interstitial changes.

## 2018-03-20 IMAGING — CT CT HEAD W/O CM
3 series · 15 of 47 positions shown, 18 images · non-contrast
Comparison: MRI head September 10, 2016

CLINICAL DATA: Weakness. Feeling unwell since 2688 hours today.
History of atrial fibrillation, breast cancer, diabetes,
hypertension and hyperlipidemia.

EXAM:
CT HEAD WITHOUT CONTRAST
TECHNIQUE: Contiguous axial images were obtained from the base of the skull
through the vertex without intravenous contrast.

[Series 2: head wo · axial · 0.45mm/px · z∈[-106,+19]mm · 9 of 30 slices shown, 12 images]
[im 3/30  brain]
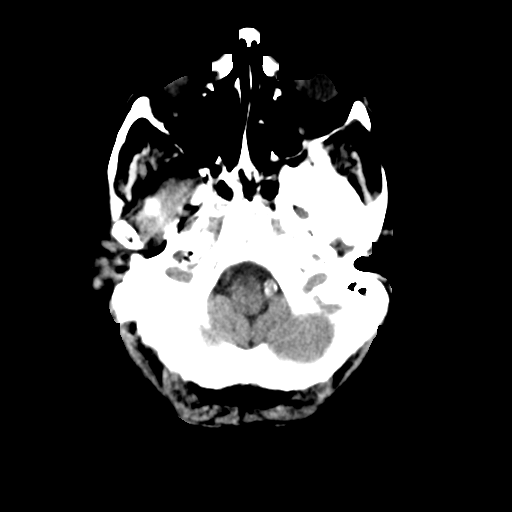
[im 3/30  bone]
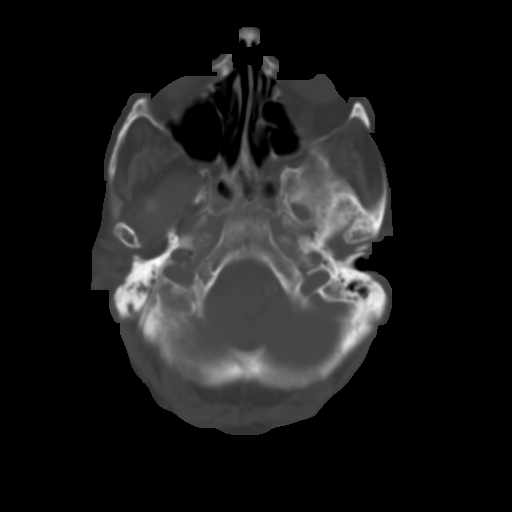
[im 6/30  brain]
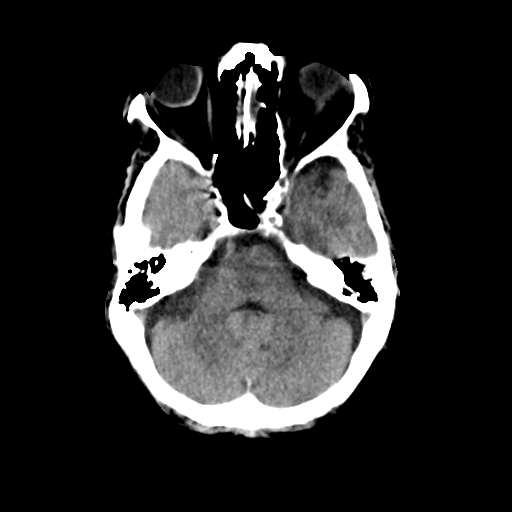
[im 9/30  brain]
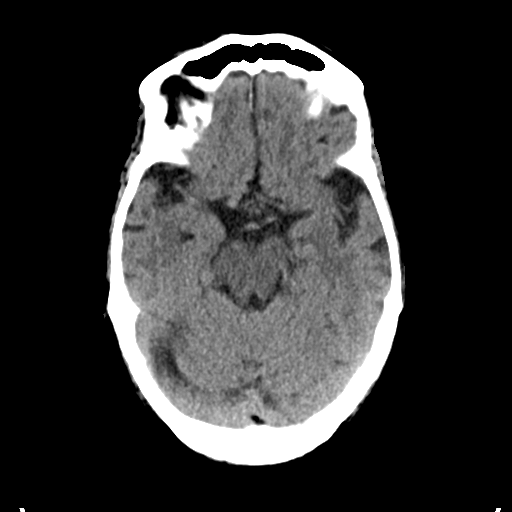
[im 12/30  brain]
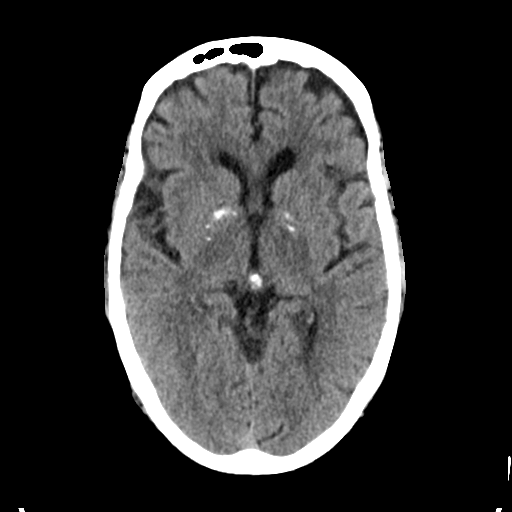
[im 16/30  brain]
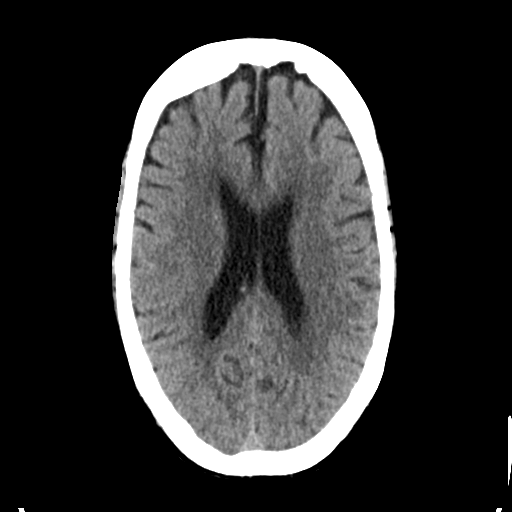
[im 16/30  bone]
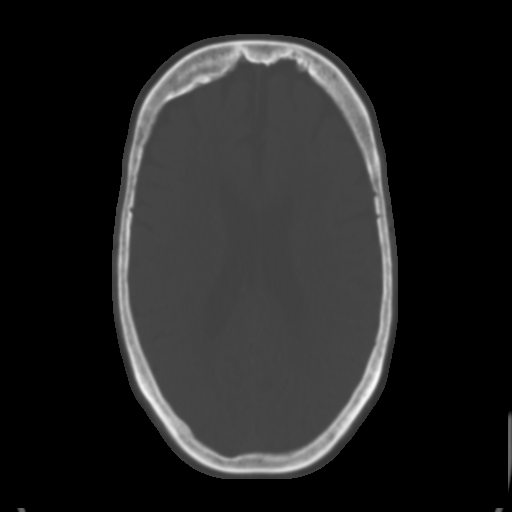
[im 19/30  brain]
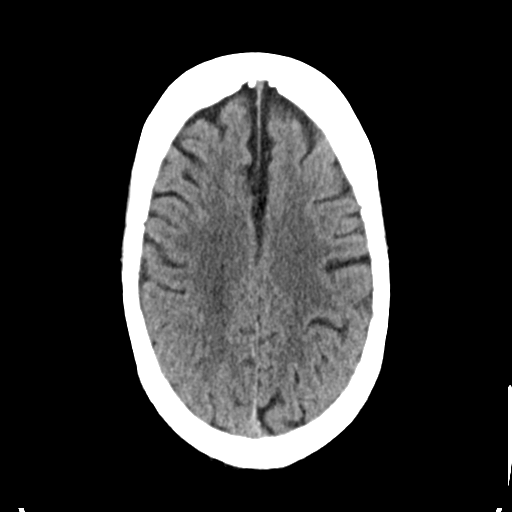
[im 22/30  brain]
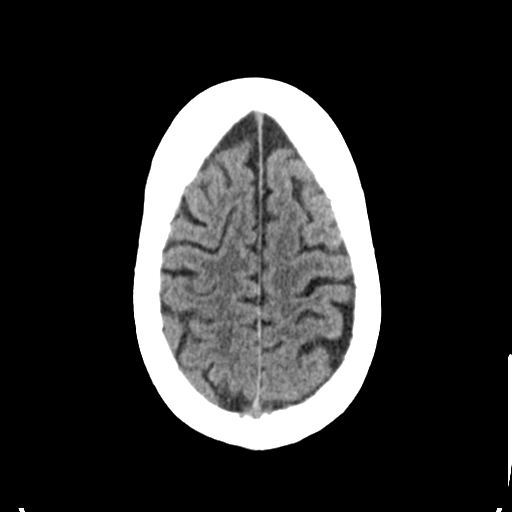
[im 25/30  brain]
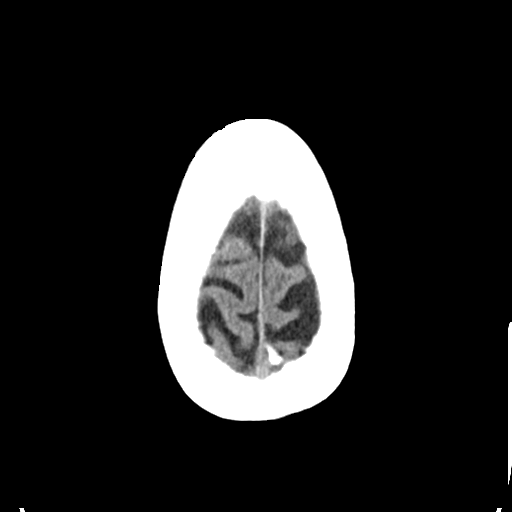
[im 28/30  brain]
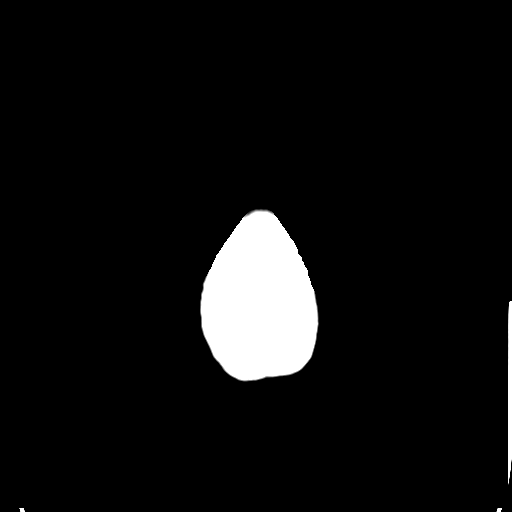
[im 28/30  bone]
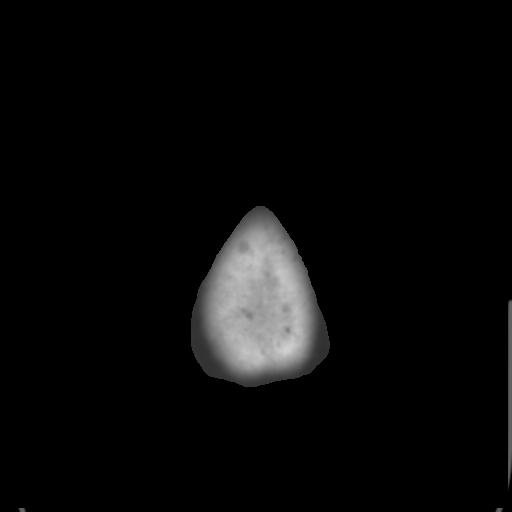

[Series 4: coronal soft tissue · coronal · 0.29mm/px · 3 of 68 slices shown]
[im 23/68  brain]
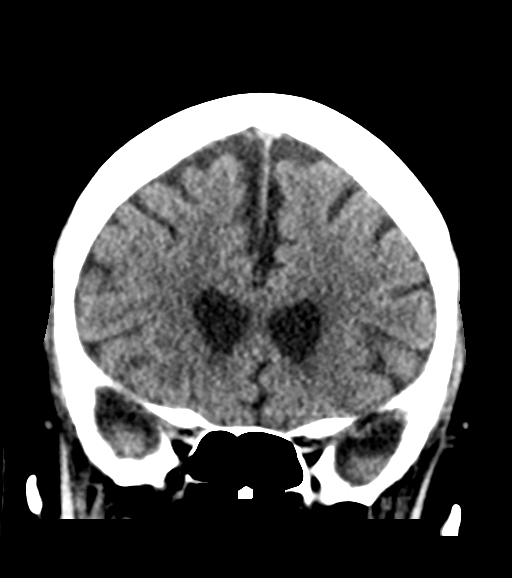
[im 30/68  brain]
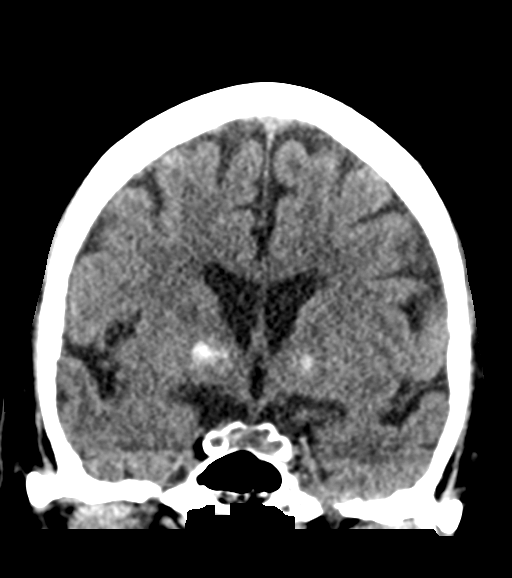
[im 38/68  brain]
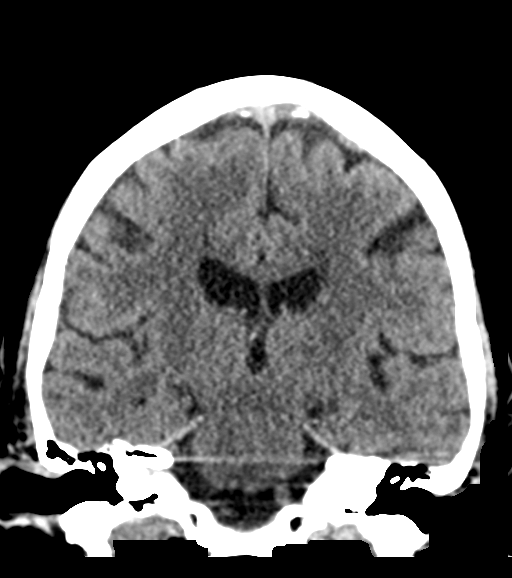

[Series 5: sagittal soft tissue · sagittal · 0.33mm/px · 3 of 46 slices shown]
[im 16/46  brain]
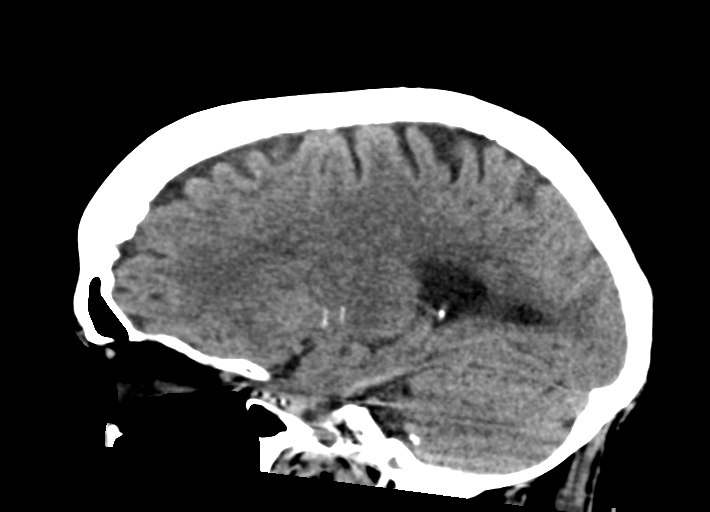
[im 23/46  brain]
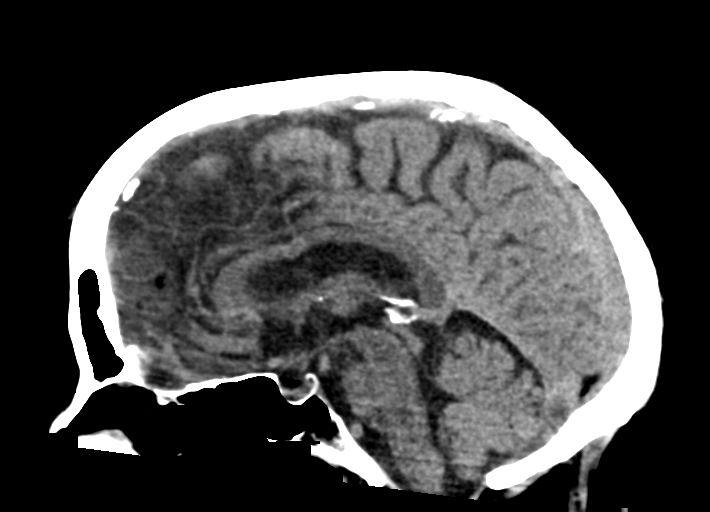
[im 31/46  brain]
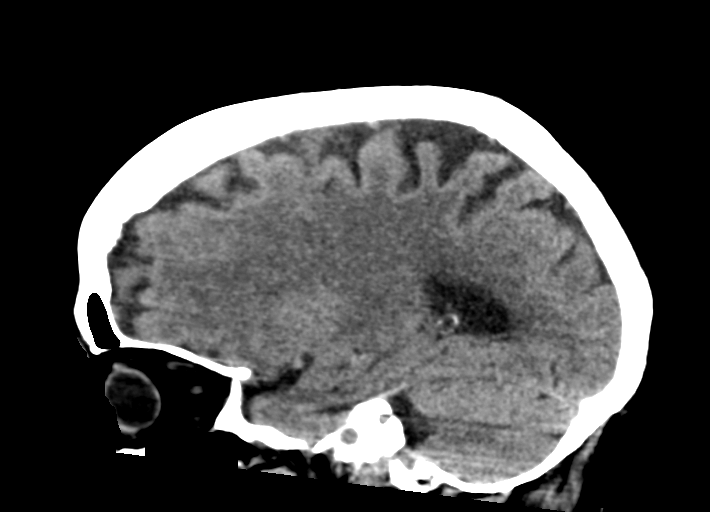

[15 of 47 positions shown; findings below may reference images not displayed]

FINDINGS: BRAIN: The ventricles and sulci are normal for age. No
intraparenchymal hemorrhage, mass effect nor midline shift. Patchy
supratentorial white matter hypodensities less than expected for
patient's age less than expected, though non-specific are most
compatible with chronic small vessel ischemic disease. Symmetric
basal ganglia mineralization. No acute large vascular territory
infarcts. No abnormal extra-axial fluid collections. Basal cisterns
are patent.

VASCULAR: Moderate calcific atherosclerosis of the carotid siphons.

SKULL: No skull fracture. No significant scalp soft tissue swelling.
Moderate RIGHT and severe LEFT temporomandibular osteoarthrosis.

SINUSES/ORBITS: The mastoid air-cells and included paranasal sinuses
are well-aerated. Status post bilateral ocular lens implants. The
included ocular globes and orbital contents are non-suspicious.

OTHER: Patient is edentulous.
IMPRESSION: Negative CT HEAD for age.
